# Patient Record
Sex: Male | Born: 1950 | Race: White | Hispanic: No | Marital: Married | Smoking: Former smoker
Health system: Southern US, Community
[De-identification: ages and names within clinical notes are randomized; demographics above are authoritative.]

## PROBLEM LIST (undated history)

## (undated) DIAGNOSIS — H409 Unspecified glaucoma: Secondary | ICD-10-CM

## (undated) DIAGNOSIS — G4730 Sleep apnea, unspecified: Secondary | ICD-10-CM

## (undated) DIAGNOSIS — F32A Depression, unspecified: Secondary | ICD-10-CM

## (undated) DIAGNOSIS — R12 Heartburn: Secondary | ICD-10-CM

## (undated) DIAGNOSIS — N200 Calculus of kidney: Secondary | ICD-10-CM

## (undated) DIAGNOSIS — Z87442 Personal history of urinary calculi: Secondary | ICD-10-CM

## (undated) DIAGNOSIS — M1250 Traumatic arthropathy, unspecified site: Secondary | ICD-10-CM

## (undated) DIAGNOSIS — I10 Essential (primary) hypertension: Secondary | ICD-10-CM

## (undated) DIAGNOSIS — C801 Malignant (primary) neoplasm, unspecified: Secondary | ICD-10-CM

## (undated) DIAGNOSIS — F419 Anxiety disorder, unspecified: Secondary | ICD-10-CM

## (undated) DIAGNOSIS — G894 Chronic pain syndrome: Secondary | ICD-10-CM

## (undated) DIAGNOSIS — M5030 Other cervical disc degeneration, unspecified cervical region: Secondary | ICD-10-CM

## (undated) DIAGNOSIS — F329 Major depressive disorder, single episode, unspecified: Secondary | ICD-10-CM

## (undated) DIAGNOSIS — G44309 Post-traumatic headache, unspecified, not intractable: Secondary | ICD-10-CM

## (undated) DIAGNOSIS — G43909 Migraine, unspecified, not intractable, without status migrainosus: Secondary | ICD-10-CM

## (undated) HISTORY — DX: Depression, unspecified: F32.A

## (undated) HISTORY — DX: Migraine, unspecified, not intractable, without status migrainosus: G43.909

## (undated) HISTORY — DX: Major depressive disorder, single episode, unspecified: F32.9

## (undated) HISTORY — DX: Chronic pain syndrome: G89.4

## (undated) HISTORY — DX: Sleep apnea, unspecified: G47.30

## (undated) HISTORY — PX: SKIN CANCER EXCISION: SHX779

## (undated) HISTORY — DX: Post-traumatic headache, unspecified, not intractable: G44.309

## (undated) HISTORY — DX: Traumatic arthropathy, unspecified site: M12.50

## (undated) HISTORY — DX: Malignant (primary) neoplasm, unspecified: C80.1

## (undated) HISTORY — DX: Essential (primary) hypertension: I10

## (undated) HISTORY — DX: Heartburn: R12

## (undated) HISTORY — DX: Unspecified glaucoma: H40.9

## (undated) HISTORY — PX: WISDOM TOOTH EXTRACTION: SHX21

## (undated) HISTORY — PX: TONSILLECTOMY: SUR1361

## (undated) HISTORY — DX: Anxiety disorder, unspecified: F41.9

## (undated) HISTORY — DX: Other cervical disc degeneration, unspecified cervical region: M50.30

## (undated) HISTORY — DX: Calculus of kidney: N20.0

---

## 1992-09-01 HISTORY — PX: COSMETIC SURGERY: SHX468

## 1993-09-01 HISTORY — PX: OTHER SURGICAL HISTORY: SHX169

## 2009-05-23 DIAGNOSIS — F32A Depression, unspecified: Secondary | ICD-10-CM | POA: Insufficient documentation

## 2009-05-23 DIAGNOSIS — M545 Low back pain, unspecified: Secondary | ICD-10-CM | POA: Insufficient documentation

## 2009-05-23 DIAGNOSIS — I10 Essential (primary) hypertension: Secondary | ICD-10-CM | POA: Insufficient documentation

## 2011-05-13 DIAGNOSIS — M1991 Primary osteoarthritis, unspecified site: Secondary | ICD-10-CM | POA: Insufficient documentation

## 2011-06-17 DIAGNOSIS — L570 Actinic keratosis: Secondary | ICD-10-CM | POA: Insufficient documentation

## 2012-02-10 DIAGNOSIS — H5210 Myopia, unspecified eye: Secondary | ICD-10-CM | POA: Insufficient documentation

## 2012-02-10 DIAGNOSIS — H401390 Pigmentary glaucoma, unspecified eye, stage unspecified: Secondary | ICD-10-CM | POA: Insufficient documentation

## 2012-06-28 DIAGNOSIS — J301 Allergic rhinitis due to pollen: Secondary | ICD-10-CM | POA: Insufficient documentation

## 2012-06-28 DIAGNOSIS — J320 Chronic maxillary sinusitis: Secondary | ICD-10-CM | POA: Insufficient documentation

## 2012-08-16 DIAGNOSIS — Z0289 Encounter for other administrative examinations: Secondary | ICD-10-CM | POA: Insufficient documentation

## 2013-01-26 DIAGNOSIS — J329 Chronic sinusitis, unspecified: Secondary | ICD-10-CM | POA: Insufficient documentation

## 2013-02-02 DIAGNOSIS — M1710 Unilateral primary osteoarthritis, unspecified knee: Secondary | ICD-10-CM | POA: Insufficient documentation

## 2013-02-02 DIAGNOSIS — M1990 Unspecified osteoarthritis, unspecified site: Secondary | ICD-10-CM | POA: Insufficient documentation

## 2013-04-27 DIAGNOSIS — M792 Neuralgia and neuritis, unspecified: Secondary | ICD-10-CM | POA: Insufficient documentation

## 2013-10-12 DIAGNOSIS — F419 Anxiety disorder, unspecified: Secondary | ICD-10-CM | POA: Insufficient documentation

## 2013-12-08 DIAGNOSIS — Z87898 Personal history of other specified conditions: Secondary | ICD-10-CM | POA: Insufficient documentation

## 2013-12-08 DIAGNOSIS — Z86006 Personal history of melanoma in-situ: Secondary | ICD-10-CM | POA: Insufficient documentation

## 2014-03-02 ENCOUNTER — Other Ambulatory Visit: Payer: Self-pay | Admitting: Urgent Care

## 2014-03-02 LAB — CBC WITH DIFFERENTIAL/PLATELET
Basophil #: 0 10*3/uL (ref 0.0–0.1)
Basophil %: 0.5 %
EOS PCT: 0.9 %
Eosinophil #: 0.1 10*3/uL (ref 0.0–0.7)
HCT: 34.6 % — ABNORMAL LOW (ref 40.0–52.0)
HGB: 10.4 g/dL — ABNORMAL LOW (ref 13.0–18.0)
LYMPHS ABS: 2.3 10*3/uL (ref 1.0–3.6)
Lymphocyte %: 28.2 %
MCH: 20 pg — ABNORMAL LOW (ref 26.0–34.0)
MCHC: 29.9 g/dL — ABNORMAL LOW (ref 32.0–36.0)
MCV: 67 fL — ABNORMAL LOW (ref 80–100)
Monocyte #: 1 x10 3/mm (ref 0.2–1.0)
Monocyte %: 12.8 %
NEUTROS PCT: 57.6 %
Neutrophil #: 4.7 10*3/uL (ref 1.4–6.5)
Platelet: 284 10*3/uL (ref 150–440)
RBC: 5.18 10*6/uL (ref 4.40–5.90)
RDW: 20.9 % — ABNORMAL HIGH (ref 11.5–14.5)
WBC: 8.1 10*3/uL (ref 3.8–10.6)

## 2014-03-02 LAB — IRON AND TIBC
Iron Bind.Cap.(Total): 457 ug/dL — ABNORMAL HIGH (ref 250–450)
Iron Saturation: 5 %
Iron: 23 ug/dL — ABNORMAL LOW (ref 65–175)
Unbound Iron-Bind.Cap.: 434 ug/dL

## 2014-03-02 LAB — FERRITIN: Ferritin (ARMC): 5 ng/mL — ABNORMAL LOW (ref 8–388)

## 2014-03-10 ENCOUNTER — Ambulatory Visit: Payer: Self-pay | Admitting: Gastroenterology

## 2014-04-04 ENCOUNTER — Ambulatory Visit: Payer: Self-pay | Admitting: Pain Medicine

## 2014-04-05 ENCOUNTER — Ambulatory Visit: Payer: Self-pay | Admitting: Pain Medicine

## 2014-04-13 ENCOUNTER — Other Ambulatory Visit: Payer: Self-pay | Admitting: Gastroenterology

## 2014-04-13 LAB — CBC WITH DIFFERENTIAL/PLATELET
BASOS ABS: 0.1 10*3/uL (ref 0.0–0.1)
BASOS PCT: 0.7 %
EOS ABS: 0.2 10*3/uL (ref 0.0–0.7)
Eosinophil %: 2.3 %
HCT: 41.1 % (ref 40.0–52.0)
HGB: 12.4 g/dL — ABNORMAL LOW (ref 13.0–18.0)
LYMPHS PCT: 24.8 %
Lymphocyte #: 2 10*3/uL (ref 1.0–3.6)
MCH: 22.8 pg — ABNORMAL LOW (ref 26.0–34.0)
MCHC: 30.2 g/dL — ABNORMAL LOW (ref 32.0–36.0)
MCV: 76 fL — AB (ref 80–100)
Monocyte #: 1 x10 3/mm (ref 0.2–1.0)
Monocyte %: 12 %
Neutrophil #: 4.9 10*3/uL (ref 1.4–6.5)
Neutrophil %: 60.2 %
Platelet: 214 10*3/uL (ref 150–440)
RBC: 5.44 10*6/uL (ref 4.40–5.90)
RDW: 27.3 % — AB (ref 11.5–14.5)
WBC: 8.1 10*3/uL (ref 3.8–10.6)

## 2014-04-20 ENCOUNTER — Ambulatory Visit: Payer: Self-pay | Admitting: Pain Medicine

## 2014-04-28 DIAGNOSIS — G44329 Chronic post-traumatic headache, not intractable: Secondary | ICD-10-CM | POA: Insufficient documentation

## 2014-05-02 ENCOUNTER — Ambulatory Visit: Payer: Self-pay | Admitting: Pain Medicine

## 2014-05-15 ENCOUNTER — Ambulatory Visit: Payer: Self-pay | Admitting: Pain Medicine

## 2014-06-13 ENCOUNTER — Ambulatory Visit: Payer: Self-pay | Admitting: Pain Medicine

## 2014-06-19 ENCOUNTER — Ambulatory Visit: Payer: Self-pay | Admitting: Pain Medicine

## 2014-07-13 ENCOUNTER — Ambulatory Visit: Payer: Self-pay | Admitting: Pain Medicine

## 2014-07-21 ENCOUNTER — Ambulatory Visit: Payer: Self-pay | Admitting: Urgent Care

## 2014-07-21 LAB — CBC WITH DIFFERENTIAL/PLATELET
Basophil #: 0.1 10*3/uL (ref 0.0–0.1)
Basophil %: 0.5 %
Eosinophil #: 0.2 10*3/uL (ref 0.0–0.7)
Eosinophil %: 1.8 %
HCT: 44.5 % (ref 40.0–52.0)
HGB: 14.7 g/dL (ref 13.0–18.0)
Lymphocyte #: 2.4 10*3/uL (ref 1.0–3.6)
Lymphocyte %: 21.4 %
MCH: 29.8 pg (ref 26.0–34.0)
MCHC: 33.1 g/dL (ref 32.0–36.0)
MCV: 90 fL (ref 80–100)
MONO ABS: 1.2 x10 3/mm — AB (ref 0.2–1.0)
MONOS PCT: 10.3 %
NEUTROS PCT: 66 %
Neutrophil #: 7.4 10*3/uL — ABNORMAL HIGH (ref 1.4–6.5)
Platelet: 199 10*3/uL (ref 150–440)
RBC: 4.95 10*6/uL (ref 4.40–5.90)
RDW: 14.7 % — ABNORMAL HIGH (ref 11.5–14.5)
WBC: 11.2 10*3/uL — ABNORMAL HIGH (ref 3.8–10.6)

## 2014-08-08 ENCOUNTER — Ambulatory Visit: Payer: Self-pay | Admitting: Pain Medicine

## 2014-08-30 DIAGNOSIS — F325 Major depressive disorder, single episode, in full remission: Secondary | ICD-10-CM | POA: Insufficient documentation

## 2014-09-12 ENCOUNTER — Ambulatory Visit: Payer: Self-pay | Admitting: Pain Medicine

## 2014-09-28 ENCOUNTER — Ambulatory Visit: Payer: Self-pay | Admitting: Pain Medicine

## 2014-10-26 ENCOUNTER — Ambulatory Visit: Payer: Self-pay | Admitting: Pain Medicine

## 2014-11-22 ENCOUNTER — Ambulatory Visit: Payer: Self-pay | Admitting: Pain Medicine

## 2014-12-25 ENCOUNTER — Ambulatory Visit
Admit: 2014-12-25 | Disposition: A | Discharge status: Home / Self Care | Payer: Self-pay | Attending: Pain Medicine | Admitting: Pain Medicine

## 2015-01-24 ENCOUNTER — Ambulatory Visit: Payer: Medicare Other | Attending: Pain Medicine | Admitting: Pain Medicine

## 2015-01-24 ENCOUNTER — Encounter: Payer: Self-pay | Admitting: Pain Medicine

## 2015-01-24 ENCOUNTER — Other Ambulatory Visit: Payer: Self-pay | Admitting: Pain Medicine

## 2015-01-24 VITALS — BP 121/66 | HR 52 | Temp 98.0°F | Resp 18 | Ht 69.0 in | Wt 188.0 lb

## 2015-01-24 DIAGNOSIS — M47816 Spondylosis without myelopathy or radiculopathy, lumbar region: Secondary | ICD-10-CM | POA: Insufficient documentation

## 2015-01-24 DIAGNOSIS — M5416 Radiculopathy, lumbar region: Secondary | ICD-10-CM

## 2015-01-24 DIAGNOSIS — M179 Osteoarthritis of knee, unspecified: Secondary | ICD-10-CM | POA: Insufficient documentation

## 2015-01-24 DIAGNOSIS — M533 Sacrococcygeal disorders, not elsewhere classified: Secondary | ICD-10-CM

## 2015-01-24 DIAGNOSIS — M5136 Other intervertebral disc degeneration, lumbar region: Secondary | ICD-10-CM

## 2015-01-24 DIAGNOSIS — M79604 Pain in right leg: Secondary | ICD-10-CM | POA: Diagnosis present

## 2015-01-24 DIAGNOSIS — M1710 Unilateral primary osteoarthritis, unspecified knee: Secondary | ICD-10-CM | POA: Insufficient documentation

## 2015-01-24 DIAGNOSIS — M51369 Other intervertebral disc degeneration, lumbar region without mention of lumbar back pain or lower extremity pain: Secondary | ICD-10-CM | POA: Insufficient documentation

## 2015-01-24 DIAGNOSIS — M461 Sacroiliitis, not elsewhere classified: Secondary | ICD-10-CM | POA: Insufficient documentation

## 2015-01-24 DIAGNOSIS — M1712 Unilateral primary osteoarthritis, left knee: Secondary | ICD-10-CM

## 2015-01-24 DIAGNOSIS — M79605 Pain in left leg: Secondary | ICD-10-CM | POA: Diagnosis present

## 2015-01-24 DIAGNOSIS — M545 Low back pain: Secondary | ICD-10-CM | POA: Diagnosis present

## 2015-01-24 MED ORDER — GABAPENTIN 100 MG PO CAPS: ORAL_CAPSULE | ORAL | Status: DC

## 2015-01-24 MED ORDER — HYDROCODONE-ACETAMINOPHEN 7.5-325 MG PO TABS: ORAL_TABLET | ORAL | Status: DC

## 2015-01-24 MED ORDER — FENTANYL 50 MCG/HR TD PT72: 50.0000 ug | MEDICATED_PATCH | TRANSDERMAL | Status: DC

## 2015-01-24 NOTE — Progress Notes (Signed)
Ambulatory with scripts of fentanyl hydrocodone Gabapentin  At 805am

## 2015-01-24 NOTE — Patient Instructions (Addendum)
Continue present medications.  Geniculate nerve blocks of left knee on 02/05/2015  F/U PCP for evaliation of  BP and general medical  condition.  F/U surgical evaluation.  F/U neurological evaluation.  May consider radiofrequency rhizolysis or intraspinal procedures pending response to present treatment and F/U evaluation.  Knee Injection Joint injections are shots. Your caregiver will place a needle into your knee joint. The needle is used to put medicine into the joint. These shots can be used to help treat different painful knee conditions such as osteoarthritis, bursitis, local flare-ups of rheumatoid arthritis, and pseudogout. Anti-inflammatory medicines such as corticosteroids and anesthetics are the most common medicines used for joint and soft tissue injections.  PROCEDURE  The skin over the kneecap will be cleaned with an antiseptic solution.  Your caregiver will inject a small amount of a local anesthetic (a medicine like Novocaine) just under the skin in the area that was cleaned.  After the area becomes numb, a second injection is done. This second injection usually includes an anesthetic and an anti-inflammatory medicine called a steroid or cortisone. The needle is carefully placed in between the kneecap and the knee, and the medicine is injected into the joint space.  After the injection is done, the needle is removed. Your caregiver may place a bandage over the injection site. The whole procedure takes no more than a couple of minutes. BEFORE THE PROCEDURE  Wash all of the skin around the entire knee area. Try to remove any loose, scaling skin. There is no other specific preparation necessary unless advised otherwise by your caregiver. LET YOUR CAREGIVER KNOW ABOUT:   Allergies.  Medications taken including herbs, eye drops, over the counter medications, and creams.  Use of steroids (by mouth or creams).  Possible pregnancy, if applicable.  Previous problems with  anesthetics or Novocaine.  History of blood clots (thrombophlebitis).  History of bleeding or blood problems.  Previous surgery.  Other health problems. RISKS AND COMPLICATIONS Side effects from cortisone shots are rare. They include:   Slight bruising of the skin.  Shrinkage of the normal fatty tissue under the skin where the shot was given.  Increase in pain after the shot.  Infection.  Weakening of tendons or tendon rupture.  Allergic reaction to the medicine.  Diabetics may have a temporary increase in their blood sugar after a shot.  Cortisone can temporarily weaken the immune system. While receiving these shots, you should not get certain vaccines. Also, avoid contact with anyone who has chickenpox or measles. Especially if you have never had these diseases or have not been previously immunized. Your immune system may not be strong enough to fight off the infection while the cortisone is in your system. AFTER THE PROCEDURE   You can go home after the procedure.  You may need to put ice on the joint 15-20 minutes every 3 or 4 hours until the pain goes away.  You may need to put an elastic bandage on the joint. HOME CARE INSTRUCTIONS   Only take over-the-counter or prescription medicines for pain, discomfort, or fever as directed by your caregiver.  You should avoid stressing the joint. Unless advised otherwise, avoid activities that put a lot of pressure on a knee joint, such as:  Jogging.  Bicycling.  Recreational climbing.  Hiking.  Laying down and elevating the leg/knee above the level of your heart can help to minimize swelling. SEEK MEDICAL CARE IF:   You have repeated or worsening swelling.  There is drainage  from the puncture area.  You develop red streaking that extends above or below the site where the needle was inserted. SEEK IMMEDIATE MEDICAL CARE IF:   You develop a fever.  You have pain that gets worse even though you are taking pain  medicine.  The area is red and warm, and you have trouble moving the joint. MAKE SURE YOU:   Understand these instructions.  Will watch your condition.  Will get help right away if you are not doing well or get worse. Document Released: 11/09/2006 Document Revised: 11/10/2011 Document Reviewed: 08/06/2007 [facility] Patient Information 2015 [place], [place]. This information is not intended to replace advice given to you by your health care provider. Make sure you discuss any questions you have with your health care provider. Marland Kitchen

## 2015-01-24 NOTE — Progress Notes (Signed)
   Subjective:    Patient ID: Tanner Baker, male    DOB: [birth year], 64 y.o.   MRN: [id number]  HPI   Patientt is 64 year old gentleman [facility] for further evaluation and treatment of pain involving the lumbar and lower extremity region. Patient states that his pain involves the region of the left knee at this time. Patient is known degenerative changes of the left knee. Patient states that the pain is aggravated by standing walking and that the pain is quite severe at midday and continues to become more intense as the day progresses and interferes with patient's ability to obtain restful sleep. We will continue patient's present medications and will consider patient for interventional treatment  Geniculate nerve blocks of the knee at time return appointment is discussed and explained to patient on today's visit     Review of Systems     Objective:   Physical Exam  palpation of the splenius capitis and occipitalis muscles reproduced mild discomfort there was no tenderness of the acromioclavicular glenohumeral joint regions. Patient was with bilaterally equal grip strength. Tinel's and Phalen's maneuver were without increased pain of significant degree. There was mild tenderness over the thoracic paraspinal musculature region in the lower thoracic region without crepitus of the thoracic region noted. Palpation over the lumbar paraspinal muscles lumbar facet region reproduces moderate discomfort. Straight leg raising was tolerates approximately 30 without increase of pain with dorsiflexion noted. No definite sensory deficit of dermatomal distribution was detected. Palpation of the knee was in crepitus of the knee noted with range of motion of the knee being performed. There was negative anterior and posterior drawer signs without ballottement of the patella. No increased warmth erythema of the knee was noted. There was severe pain with range of motion maneuvers of the knee.  Mild tenderness of the greater trochanteric region noted. Abdomen nontender and no costovertebral angle tenderness was noted.           Assessment & Plan:     Degenerative joint disease of knee   Degenerative disc disease lumbar spine   Lumbar facet syndrome   Sacroiliitis    Plan     Continue present medications.  Geniculate nerve blocks of the left knee to be performed at time of return appointment  F/U PCP for evaliation of  BP and general medical  condition.  F/U surgical evaluation.  F/U neurological evaluation.  May consider radiofrequency rhizolysis or intraspinal procedures pending response to present treatment and F/U evaluation.  Patient to call [facility] should patient have concerns prior to scheduled return appointment.

## 2015-01-24 NOTE — Progress Notes (Signed)
   Subjective:    Patient ID: Tanner Baker, male    DOB: May 22, 1951, 64 y.o.   MRN: [id number]  HPI    Review of Systems     Objective:   Physical Exam        Assessment & Plan:

## 2015-02-04 ENCOUNTER — Other Ambulatory Visit: Payer: Self-pay | Admitting: Pain Medicine

## 2015-02-07 ENCOUNTER — Encounter: Payer: Self-pay | Admitting: Pain Medicine

## 2015-02-07 ENCOUNTER — Ambulatory Visit: Payer: Medicare Other | Attending: Pain Medicine | Admitting: Pain Medicine

## 2015-02-07 VITALS — BP 121/59 | HR 57 | Temp 98.4°F | Resp 14 | Ht 69.0 in | Wt 188.0 lb

## 2015-02-07 DIAGNOSIS — M533 Sacrococcygeal disorders, not elsewhere classified: Secondary | ICD-10-CM

## 2015-02-07 DIAGNOSIS — M170 Bilateral primary osteoarthritis of knee: Secondary | ICD-10-CM

## 2015-02-07 DIAGNOSIS — M1712 Unilateral primary osteoarthritis, left knee: Secondary | ICD-10-CM | POA: Diagnosis not present

## 2015-02-07 DIAGNOSIS — M47816 Spondylosis without myelopathy or radiculopathy, lumbar region: Secondary | ICD-10-CM

## 2015-02-07 DIAGNOSIS — M5416 Radiculopathy, lumbar region: Secondary | ICD-10-CM

## 2015-02-07 DIAGNOSIS — R209 Unspecified disturbances of skin sensation: Secondary | ICD-10-CM | POA: Insufficient documentation

## 2015-02-07 DIAGNOSIS — M5136 Other intervertebral disc degeneration, lumbar region: Secondary | ICD-10-CM

## 2015-02-07 DIAGNOSIS — M545 Low back pain: Secondary | ICD-10-CM | POA: Diagnosis present

## 2015-02-07 DIAGNOSIS — M79604 Pain in right leg: Secondary | ICD-10-CM | POA: Diagnosis present

## 2015-02-07 DIAGNOSIS — M79605 Pain in left leg: Secondary | ICD-10-CM | POA: Diagnosis present

## 2015-02-07 MED ORDER — TRIAMCINOLONE ACETONIDE 40 MG/ML IJ SUSP
INTRAMUSCULAR | Status: AC
  Administered 2015-02-07: 40 mg
  Filled 2015-02-07: qty 1

## 2015-02-07 MED ORDER — MIDAZOLAM HCL 5 MG/5ML IJ SOLN
INTRAMUSCULAR | Status: AC
  Administered 2015-02-07: 3 mg via INTRAVENOUS
  Filled 2015-02-07: qty 5

## 2015-02-07 MED ORDER — BUPIVACAINE HCL (PF) 0.25 % IJ SOLN
INTRAMUSCULAR | Status: AC
  Administered 2015-02-07: 30 mL
  Filled 2015-02-07: qty 30

## 2015-02-07 MED ORDER — FENTANYL CITRATE (PF) 100 MCG/2ML IJ SOLN
INTRAMUSCULAR | Status: AC
  Administered 2015-02-07: 100 ug via INTRAVENOUS
  Filled 2015-02-07: qty 2

## 2015-02-07 NOTE — Patient Instructions (Addendum)
Continue present medications.  F/U PCP for evaliation of  BP and general medical  condition.  F/U surgical evaluation.  F/U neurological evaluation.  May consider radiofrequency rhizolysis or intraspinal procedures pending response to present treatment and F/U evaluation.  Patient to call [facility] should patient have concerns prior to scheduled return appointment. Pain Management Discharge Instructions  General Discharge Instructions :  If you need to reach your doctor call: Monday-Friday 8:00 am - 4:00 pm at [phone] or toll free [phone].  After clinic hours [phone] to have operator reach doctor.  Bring all of your medication bottles to all your appointments in the pain clinic.  To cancel or reschedule your appointment with Pain Management please remember to call 24 hours in advance to avoid a fee.  Refer to the educational materials which you have been given on: General Risks, I had my Procedure. Discharge Instructions, Post Sedation.  Post Procedure Instructions:  The drugs you were given will stay in your system until tomorrow, so for the next 24 hours you should not drive, make any legal decisions or drink any alcoholic beverages.  You may eat anything you prefer, but it is better to start with liquids then soups and crackers, and gradually work up to solid foods.  Please notify your doctor immediately if you have any unusual bleeding, trouble breathing or pain that is not related to your normal pain.  Depending on the type of procedure that was done, some parts of your body may feel week and/or numb.  This usually clears up by tonight or the next day.  Walk with the use of an assistive device or accompanied by an adult for the 24 hours.  You may use ice on the affected area for the first 24 hours.  Put ice in a Ziploc bag and cover with a towel and place against area 15 minutes on 15 minutes off.  You may switch to heat after 24 hours. 

## 2015-02-07 NOTE — Progress Notes (Signed)
Subjective:    Patient ID: Tanner Baker, male    DOB: 1951/03/26, 64 y.o.   MRN: [id number]  HPI     Geniculate nerve blocks of the left knee   The patient is a 64 y.o. male who returns [facility] the [facility] for further evaluation and treatment of pain involving the lumbar lower extremity region with severe pain of the left knee. Patient is with known degenerative changes of the left knee with left knee pain which has progressed over the past several months. There is concern regarding degenerative joint disease of the knee contributing to patient's pain and paresthesias of the knee.  We will proceed with geniculate nerve blocks of the left knee in an attempt to decrease severity of symptoms, minimize the risk of medication escalation, hopefully retard progression of symptoms and avoid the need for more involved treatment.  The risks benefits and expectations of the procedure were discussed with the patient. The patient was with understanding and in agreement with suggested treatment plan.  DESCRIPTION OF PROCEDURE: Geniculate nerve blocks of the left knee. The  procedure was performed with IV Versed and IV fentanyl, conscious sedation and under fluoroscopic guidance.  NEEDLE PLACEMENT FOR BLOCK OF THE LATERAL SUPERIOR GENICULATE NERVE: The patient was taking to the fluoroscopy suite. With the patient supine, with knee in flexed position, Betadine prep of proposed entry site accomplished.  IV Versed, IV fentanyl conscious sedation, EKG, blood pressure, pulse and pulse oximetry monitoring were all in place. Under fluoroscopic guidance, a 22-gauge needle was inserted in the region of the left knee with needle placed lateral border of the femur at the junction of the shaft of the femur and the condyle of the femur.  Following needle placement at the lateral aspect of the knee, needle placement was then accomplished in the region of the medial aspect of the knee.  NEEDLE PLACEMENT FOR BLOCK  OF THE MEDIAL SUPERIOR GENICULATE NERVE:  Under fluoroscopic guidance, a 22 - gauge needle was inserted in the region of the left knee with needle placed medial border of the femur at the junction of the shaft of the femur and the condyle of the femur.   NEEDLE PLACEMENT FOR BLOCK OF THE MEDIAL INFERIOR GENICULATE NERVE:  Under fluoroscopic guidance, a 22 - gauge needle was inserted in the region of the left knee with needle placed at the junction of the shaft and plateau of the tibia.   Following needle placement on AP view of needles placed in all three locations, placement was then verified on lateral view with the tips of the superior lateral and superior medial needles documented to be one half the distance of the shaft of the femur and the tip of the inferior medial geniculate needle documented to be one half the distance of the shaft of the tibia.  Following documentation of needle placements on lateral view, H needle was injected with one mL of 0.25% bupivacaine with Kenalog. A total of 10 mg of Kenalog was utilized for the entire procedure. The patient tolerated the procedure well.    PLAN 1. Medications: Continue present medications 2. Follow-up appointment with PCP for evaluation of blood pressure and general medical condition. 3. Follow-up surgical evaluation 4. Follow-up neurological evaluation 5. He patient may be a candidate for radiofrequency rhizolysis and other treatment pending response to treatment on today's visit and follow-up evaluation. 6. The patient is advised to adhere to proper body mechanics and avoid activities which appear to aggravate condition  7. Orthopedic evaluation of knee as discussed  Review of Systems    Objective:   Physical Exam        Assessment & Plan:

## 2015-02-07 NOTE — Progress Notes (Signed)
Safety precautions to be maintained throughout the outpatient stay will include: orient to surroundings, keep bed in low position, maintain call bell within reach at all times, provide assistance with transfer out of bed and ambulation.  Tolerating po fluids well. No c/o nausea. Discharged via w/c at 0901 am

## 2015-02-08 ENCOUNTER — Telehealth: Payer: Self-pay | Admitting: *Deleted

## 2015-02-08 NOTE — Telephone Encounter (Signed)
No problems with procedure.

## 2015-02-21 ENCOUNTER — Encounter: Payer: Self-pay | Admitting: Pain Medicine

## 2015-02-21 ENCOUNTER — Ambulatory Visit: Payer: Medicare Other | Attending: Pain Medicine | Admitting: Pain Medicine

## 2015-02-21 VITALS — BP 117/67 | HR 56 | Temp 98.3°F | Resp 18 | Ht 69.0 in | Wt 188.0 lb

## 2015-02-21 DIAGNOSIS — M5136 Other intervertebral disc degeneration, lumbar region: Secondary | ICD-10-CM | POA: Insufficient documentation

## 2015-02-21 DIAGNOSIS — M179 Osteoarthritis of knee, unspecified: Secondary | ICD-10-CM | POA: Diagnosis not present

## 2015-02-21 DIAGNOSIS — M5416 Radiculopathy, lumbar region: Secondary | ICD-10-CM

## 2015-02-21 DIAGNOSIS — M545 Low back pain: Secondary | ICD-10-CM | POA: Diagnosis present

## 2015-02-21 DIAGNOSIS — M533 Sacrococcygeal disorders, not elsewhere classified: Secondary | ICD-10-CM | POA: Insufficient documentation

## 2015-02-21 DIAGNOSIS — M79604 Pain in right leg: Secondary | ICD-10-CM | POA: Diagnosis present

## 2015-02-21 DIAGNOSIS — M79605 Pain in left leg: Secondary | ICD-10-CM | POA: Diagnosis present

## 2015-02-21 DIAGNOSIS — M170 Bilateral primary osteoarthritis of knee: Secondary | ICD-10-CM

## 2015-02-21 DIAGNOSIS — M47816 Spondylosis without myelopathy or radiculopathy, lumbar region: Secondary | ICD-10-CM

## 2015-02-21 MED ORDER — FENTANYL 50 MCG/HR TD PT72: 50.0000 ug | MEDICATED_PATCH | TRANSDERMAL | Status: DC

## 2015-02-21 MED ORDER — HYDROCODONE-ACETAMINOPHEN 7.5-325 MG PO TABS: ORAL_TABLET | ORAL | Status: DC

## 2015-02-21 NOTE — Progress Notes (Signed)
Safety precautions to be maintained throughout the outpatient stay will include: orient to surroundings, keep bed in low position, maintain call bell within reach at all times, provide assistance with transfer out of bed and ambulation.  Discharge via ambulatory at 7:52 am

## 2015-02-21 NOTE — Patient Instructions (Signed)
Continue present medications.  F/U PCP for evaliation of  BP and general medical  condition.  F/U surgical evaluation.  F/U neurological evaluation.  May consider radiofrequency rhizolysis or intraspinal procedures pending response to present treatment and F/U evaluation.  Patient to call [facility] should patient have concerns prior to scheduled return appointment.  

## 2015-02-21 NOTE — Progress Notes (Signed)
   Subjective:    Patient ID: Tanner Baker, male    DOB: 1951/04/13, 64 y.o.   MRN: [id number]  HPI  Patient is 64 year old gentleman who returns to [facility] for further evaluation and treatment of pain involving the lower back lower extremity region and region of the left knee. Patient is with prior interventional treatment performed in [facility] and present time patient has had significant relief of his pain involving the region of the left knee status post geniculate nerve blocks of the left knee. We discussed patient's overall condition and patient is with pain well-controlled with fentanyl patch with hydrocodone acetaminophen for breakthrough pain. We'll continue present medications and avoid interventional treatment at this time. The patient is understanding and agrees with suggested treatment plan.    Review of Systems     Objective:   Physical Exam There was tenderness of the splenius capitis occipitalis musculature region of mild degree. There appeared to be unremarkable Spurling's maneuver. Patient appeared to be with slightly increased grip strength and Tinel and Phalen's maneuver were without increase of pain of significant degree. Palpation of the thoracic facet thoracic paraspinal musculature region reproduced minimal discomfort. With no crepitus of the thoracic region noted. There was tenderness over the paraspinal muscles region the lumbar region lumbar facet region a mild degree. Palpation over the PSIS and PII S region reproduced mild discomfort. There was mild tenderness of the greater trochanteric region along the iliotibial band region. Straight leg raising was tolerates 30 without increased pain dorsiflexion noted. There was mild tends to palpation of the left knee with no increased warmth or erythema in the region of the left knee no ballottement of the patella of the left knee there was negative negative anterior and posterior drawer signs. There  was negative clonus negative Homans. Abdomen nontender and no costovertebral angle tenderness noted.       Assessment & Plan:  DDD lumbar  Lumbar facet syndrome  DJD knee Status post significant improvement of symptoms of the left knee following geniculate nerve blocks of the left knee  Sacroiliac joint dysfunction    Plan  Continue present medications Neurontin, hydrocodone acetaminophen, and fentanyl patch.  F/U PCP for evaliation of  BP and general medical  condition.  F/U surgical evaluation.  F/U neurological evaluation.  May consider radiofrequency rhizolysis or intraspinal procedures pending response to present treatment and F/U evaluation.  Patient to call [facility] should patient have concerns prior to scheduled return appointment.

## 2015-03-09 ENCOUNTER — Other Ambulatory Visit: Payer: Self-pay | Admitting: Pain Medicine

## 2015-03-20 ENCOUNTER — Encounter: Payer: Self-pay | Admitting: Pain Medicine

## 2015-03-20 ENCOUNTER — Ambulatory Visit: Payer: Medicare Other | Attending: Pain Medicine | Admitting: Pain Medicine

## 2015-03-20 VITALS — BP 128/66 | HR 51 | Temp 98.8°F | Resp 16 | Ht 69.0 in | Wt 188.0 lb

## 2015-03-20 DIAGNOSIS — M51369 Other intervertebral disc degeneration, lumbar region without mention of lumbar back pain or lower extremity pain: Secondary | ICD-10-CM

## 2015-03-20 DIAGNOSIS — M47816 Spondylosis without myelopathy or radiculopathy, lumbar region: Secondary | ICD-10-CM

## 2015-03-20 DIAGNOSIS — M170 Bilateral primary osteoarthritis of knee: Secondary | ICD-10-CM

## 2015-03-20 DIAGNOSIS — M5136 Other intervertebral disc degeneration, lumbar region: Secondary | ICD-10-CM | POA: Diagnosis not present

## 2015-03-20 DIAGNOSIS — M1710 Unilateral primary osteoarthritis, unspecified knee: Secondary | ICD-10-CM | POA: Diagnosis not present

## 2015-03-20 DIAGNOSIS — M5416 Radiculopathy, lumbar region: Secondary | ICD-10-CM

## 2015-03-20 DIAGNOSIS — M545 Low back pain: Secondary | ICD-10-CM | POA: Diagnosis present

## 2015-03-20 DIAGNOSIS — M5126 Other intervertebral disc displacement, lumbar region: Secondary | ICD-10-CM | POA: Diagnosis not present

## 2015-03-20 DIAGNOSIS — M533 Sacrococcygeal disorders, not elsewhere classified: Secondary | ICD-10-CM

## 2015-03-20 MED ORDER — HYDROCODONE-ACETAMINOPHEN 7.5-325 MG PO TABS: ORAL_TABLET | ORAL | Status: DC

## 2015-03-20 MED ORDER — FENTANYL 50 MCG/HR TD PT72: 50.0000 ug | MEDICATED_PATCH | TRANSDERMAL | Status: DC

## 2015-03-20 NOTE — Patient Instructions (Signed)
Continue present medications. Hydrocodone acetaminophen and fentanyl patch  F/U PCP Dr.Lada   for evaliation of  BP and general medical  condition.  F/U surgical evaluation  F/U neurological evaluation  May consider radiofrequency rhizolysis or intraspinal procedures pending response to present treatment and F/U evaluation.  Patient to call [facility] should patient have concerns prior to scheduled return appointment.

## 2015-03-20 NOTE — Progress Notes (Signed)
   Subjective:    Patient ID: Tanner Baker, male    DOB: [birth year], 64 y.o.   MRN: [id number]  HPI  Patient is 64 year old gentleman returns to [facility] for further evaluation and treatment of pain involving involving the lower back lower extremity region and region of the knee. Patient with pain fairly well-controlled at this time. Patient is able to perform activities of daily living as well as obtain restful sleep without significant pain interferes with activities of daily living. Patient denies trauma change in events of daily living the call significant change in symptomatology. We will continue medications and avoid interventional treatment as discussed and explained to patient on today's visit was understanding and agreement with suggested treatment plan.     Review of Systems     Objective:   Physical Exam  There was tends to palpation of the splenius capitis and occipitalis musculature region of minimal degree. Palpation over the region of the cervical facet cervical paraspinal musculature region reproduced minimal discomfort. Palpation over the acromioclavicular and glenohumeral joint region was without increased pain of any significant degree. Patient was with bilaterally equal grip strength. Tinel and Phalen's maneuver were without increase of pain of any significant degree. There was minimal tenderness over the thoracic facet thoracic paraspinal musculature region. Palpation over the lumbar paraspinal musculature region lumbar facet region was not was tends to palpation of mild degree extension and palpation of the lumbar facets reproduce mild discomfort. Lateral bending and rotation was associated with mild discomfort in the lumbar region with minimal tenderness over the PSIS and PII S region. There was mild tenderness along the greater trochanteric region iliotibial band region. Straight leg raising tolerated to 30 without increased pain with dorsiflexion noted.  Negative clonus negative Homans. Knees were without excessive tenderness to palpation. There was no increased warmth or erythema in the region of the knees. There was negative anterior and posterior drawer signs. Abdomen nontender and no costovertebral angle tenderness noted.       Assessment & Plan:    Degenerative disc disease lumbar spine  L1-L2 bulging disks, multilevel degenerative changes throughout the lumbar spine  Lumbar facet syndrome  Sacroiliac joint dysfunction  Degenerative joint disease of knee    Plan   Continue present medications hydrocodone acetaminophen and fentanyl patch  F/U PCP Dr. Ouida Sills  for evaliation of  BP and general medical  condition.  F/U surgical evaluation  F/U neurological evaluation  May consider radiofrequency rhizolysis or intraspinal procedures pending response to present treatment and F/U evaluation.  Patient to call [facility] should patient have concerns prior to scheduled return appointment.

## 2015-03-20 NOTE — Progress Notes (Signed)
Safety precautions to be maintained throughout the outpatient stay will include: orient to surroundings, keep bed in low position, maintain call bell within reach at all times, provide assistance with transfer out of bed and ambulation.  

## 2015-03-21 ENCOUNTER — Encounter: Payer: Medicare Other | Admitting: Pain Medicine

## 2015-03-30 ENCOUNTER — Encounter: Payer: Self-pay | Admitting: Emergency Medicine

## 2015-03-30 ENCOUNTER — Emergency Department
Admission: EM | Admit: 2015-03-30 | Discharge: 2015-03-30 | Disposition: A | Discharge status: Home / Self Care | Payer: Medicare Other | Attending: Emergency Medicine | Admitting: Emergency Medicine

## 2015-03-30 DIAGNOSIS — Z0000 Encounter for general adult medical examination without abnormal findings: Secondary | ICD-10-CM | POA: Insufficient documentation

## 2015-03-30 DIAGNOSIS — Z79899 Other long term (current) drug therapy: Secondary | ICD-10-CM | POA: Insufficient documentation

## 2015-03-30 DIAGNOSIS — Z85828 Personal history of other malignant neoplasm of skin: Secondary | ICD-10-CM | POA: Diagnosis not present

## 2015-03-30 DIAGNOSIS — I10 Essential (primary) hypertension: Secondary | ICD-10-CM | POA: Insufficient documentation

## 2015-03-30 DIAGNOSIS — F919 Conduct disorder, unspecified: Secondary | ICD-10-CM | POA: Diagnosis present

## 2015-03-30 DIAGNOSIS — F4325 Adjustment disorder with mixed disturbance of emotions and conduct: Secondary | ICD-10-CM | POA: Diagnosis not present

## 2015-03-30 LAB — COMPREHENSIVE METABOLIC PANEL
ALBUMIN: 4.3 g/dL (ref 3.5–5.0)
ALT: 20 U/L (ref 17–63)
ANION GAP: 12 (ref 5–15)
AST: 28 U/L (ref 15–41)
Alkaline Phosphatase: 46 U/L (ref 38–126)
BUN: 17 mg/dL (ref 6–20)
CALCIUM: 8.9 mg/dL (ref 8.9–10.3)
CHLORIDE: 95 mmol/L — AB (ref 101–111)
CO2: 30 mmol/L (ref 22–32)
CREATININE: 0.96 mg/dL (ref 0.61–1.24)
GFR calc Af Amer: >60 mL/min (ref >60)
Glucose, Bld: 116 mg/dL — ABNORMAL HIGH (ref 65–99)
Potassium: 2.7 mmol/L — CL (ref 3.5–5.1)
Sodium: 137 mmol/L (ref 135–145)
Total Bilirubin: 1.1 mg/dL (ref 0.3–1.2)
Total Protein: 7.6 g/dL (ref 6.5–8.1)

## 2015-03-30 LAB — SALICYLATE LEVEL

## 2015-03-30 LAB — ETHANOL

## 2015-03-30 LAB — ACETAMINOPHEN LEVEL

## 2015-03-30 MED ORDER — POTASSIUM CHLORIDE CRYS ER 20 MEQ PO TBCR
40.0000 meq | EXTENDED_RELEASE_TABLET | Freq: Once | ORAL | Status: AC
  Administered 2015-03-30: 40 meq via ORAL
  Filled 2015-03-30: qty 2

## 2015-03-30 NOTE — ED Notes (Addendum)
Pt here by [facility] with IVC papers. Pt handcuffed upon arrival, pt calm at this time.  Officer reports pt is having "severe suicidal thoughts" at this time.
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
## 2015-03-30 NOTE — ED Notes (Signed)
Lanny Hurst, EMTP, at bedside 1:1 sitter with patient at this time.

## 2015-03-30 NOTE — ED Notes (Signed)
IVC pt came with papers

## 2015-03-30 NOTE — ED Notes (Signed)

## 2015-03-30 NOTE — Discharge Instructions (Signed)
Please be careful what you tell to people. Return for any problems you may encounter we are always opened.
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
## 2015-03-30 NOTE — ED Notes (Signed)
BEHAVIORAL HEALTH ROUNDING Patient sleeping: No. Patient alert and oriented: yes Behavior appropriate: Yes.  ;  Nutrition and fluids offered: Yes  Toileting and hygiene offered: Yes  Sitter present: yes Law enforcement present: Yes  

## 2015-03-30 NOTE — ED Notes (Addendum)
BEHAVIORAL HEALTH ROUNDING Patient sleeping: No. Patient alert and oriented: yes Behavior appropriate: Yes.  ; If no, describe:  Nutrition and fluids offered: Yes  Toileting and hygiene offered: Yes  Sitter present: yes Law enforcement present: Yes  
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
## 2015-03-30 NOTE — ED Notes (Signed)
BEHAVIORAL HEALTH ROUNDING Patient sleeping: No. Patient alert and oriented: yes Behavior appropriate: Yes.  ; If no, describe:  Nutrition and fluids offered: Yes  Toileting and hygiene offered: Yes  Sitter present: yes Law enforcement present: Yes  

## 2015-04-19 ENCOUNTER — Ambulatory Visit: Payer: Medicare Other | Attending: Pain Medicine | Admitting: Pain Medicine

## 2015-04-19 VITALS — BP 119/59 | HR 55 | Temp 98.1°F | Resp 16 | Ht 69.0 in | Wt 188.0 lb

## 2015-04-19 DIAGNOSIS — M179 Osteoarthritis of knee, unspecified: Secondary | ICD-10-CM | POA: Insufficient documentation

## 2015-04-19 DIAGNOSIS — M5126 Other intervertebral disc displacement, lumbar region: Secondary | ICD-10-CM | POA: Insufficient documentation

## 2015-04-19 DIAGNOSIS — F419 Anxiety disorder, unspecified: Secondary | ICD-10-CM | POA: Diagnosis not present

## 2015-04-19 DIAGNOSIS — M5136 Other intervertebral disc degeneration, lumbar region: Secondary | ICD-10-CM | POA: Diagnosis not present

## 2015-04-19 DIAGNOSIS — M545 Low back pain: Secondary | ICD-10-CM | POA: Diagnosis present

## 2015-04-19 DIAGNOSIS — M79605 Pain in left leg: Secondary | ICD-10-CM | POA: Diagnosis present

## 2015-04-19 DIAGNOSIS — M533 Sacrococcygeal disorders, not elsewhere classified: Secondary | ICD-10-CM | POA: Insufficient documentation

## 2015-04-19 DIAGNOSIS — M170 Bilateral primary osteoarthritis of knee: Secondary | ICD-10-CM

## 2015-04-19 DIAGNOSIS — M5416 Radiculopathy, lumbar region: Secondary | ICD-10-CM

## 2015-04-19 DIAGNOSIS — M79604 Pain in right leg: Secondary | ICD-10-CM | POA: Diagnosis present

## 2015-04-19 DIAGNOSIS — M47816 Spondylosis without myelopathy or radiculopathy, lumbar region: Secondary | ICD-10-CM | POA: Insufficient documentation

## 2015-04-19 MED ORDER — HYDROCODONE-ACETAMINOPHEN 7.5-325 MG PO TABS: ORAL_TABLET | ORAL | Status: DC

## 2015-04-19 MED ORDER — FENTANYL 50 MCG/HR TD PT72: MEDICATED_PATCH | TRANSDERMAL | Status: DC

## 2015-04-19 NOTE — Progress Notes (Signed)
Safety precautions to be maintained throughout the outpatient stay will include: orient to surroundings, keep bed in low position, maintain call bell within reach at all times, provide assistance with transfer out of bed and ambulation.  

## 2015-04-19 NOTE — Patient Instructions (Signed)
Continue present medications Neurontin fentanyl patch and hydrocodone acetaminophen   F/U PCP Dr. Ouida Sills for evaliation of  BP and general medical  condition  F/U surgical evaluation  F/U neurological evaluation  May consider radiofrequency rhizolysis or intraspinal procedures pending response to present treatment and F/U evaluation   [facility] should patient have concerns prior to scheduled return appointmen.

## 2015-04-19 NOTE — Progress Notes (Signed)
   Subjective:    Patient ID: Tanner Baker, male    DOB: 01-20-1951, 64 y.o.   MRN: [id number]  HPI  Patient is 64 year old gentleman [facility] for further evaluation and treatment of pain involving the lower back lower extremity region and knees. At the present time patient's pain appears to be fairly well-controlled at the present medications. We discussed patient's overall condition. Patient states that he has a component of anxiety and would like to have his lorazepam refilled. We informed patient that would prefer to have patient discussed this with his primary care physician and consider psych evaluation as well to consider other medications which may be able to treat patient's condition more effectively. We will continue patient's present medications Neurontin hydrocodone acetaminophen and fentanyl patch and will remain available to proceed with interventional treatment as well as additional modification of treatment regimen as needed as discussed and explained to patient on today's visit. The patient was with understanding and in agreement with suggested treatment plan.     Review of Systems     Objective:   Physical Exam  There was tenderness of the splenius capitis and occipitalis region of mild degree. There was mild tenderness of the cervical facet cervical paraspinal musculature region and the thoracic facet thoracic paraspinal musculature region. There was unremarkable Spurling's maneuver. Mild tinnitus of the acromioclavicular glenohumeral joint region was noted. Mild tennis over the thoracic facet thoracic paraspinal musculature region was noted with no crepitus of the thoracic region noted. There was tenderness over the lumbar paraspinal muscles region lumbar facet region a mild degree. Lateral bending and rotation and extension and palpation of the lumbar facets reproduce mild discomfort. There was minimal tenderness of the knees noted with crepitus of the  knees noted with negative anterior and posterior drawer signs and with no ballottement of the patella noted. No increased warmth or erythema of the knees were noted. Straight leg raising was tolerates approximately 30. There was negative clonus negative Homans. DTRs appear to be trace at the knees. No sensory deficit of dermatomal distribution detected. There was stated clonus and negative Homans No significance to palpation greater trochanteric region iliotibial band region. Abdomen was soft nontender and no costovertebral maintenance was noted.      Assessment & Plan:   Degenerative disc disease lumbar spine  L1-L2 bulging disks, multilevel degenerative changes throughout the lumbar spine  Lumbar facet syndrome  Sacroiliac joint dysfunction  Degenerative joint disease of knee  Anxiety   Plan   Continue present medications Neurontin hydrocodone acetaminophen and fentanyl patch  F/U PCP  Lada  for evaliation of  BP and to discuss the medication lorazepam and for evaluation of general medical  condition. We've also recommended patient be considered for psych evaluation for adjustment of medications and to consider other medications for treatment of patient's anxiety   F/U surgical evaluation  F/U neurological evaluation  Consider psych evaluation of anxiety Patient will further address this with Dr.Lada    May consider radiofrequency rhizolysis or intraspinal procedures pending response to present treatment and F/U evaluation   Patient to call [facility] should patient have concerns prior to scheduled return appointmen.

## 2015-05-22 ENCOUNTER — Ambulatory Visit: Payer: Medicare Other | Attending: Pain Medicine | Admitting: Pain Medicine

## 2015-05-22 ENCOUNTER — Encounter: Payer: Self-pay | Admitting: Pain Medicine

## 2015-05-22 VITALS — BP 111/64 | HR 57 | Temp 98.1°F | Resp 16 | Ht 69.0 in | Wt 180.0 lb

## 2015-05-22 DIAGNOSIS — M5136 Other intervertebral disc degeneration, lumbar region: Secondary | ICD-10-CM | POA: Insufficient documentation

## 2015-05-22 DIAGNOSIS — M179 Osteoarthritis of knee, unspecified: Secondary | ICD-10-CM | POA: Insufficient documentation

## 2015-05-22 DIAGNOSIS — M79604 Pain in right leg: Secondary | ICD-10-CM | POA: Diagnosis present

## 2015-05-22 DIAGNOSIS — M5126 Other intervertebral disc displacement, lumbar region: Secondary | ICD-10-CM | POA: Insufficient documentation

## 2015-05-22 DIAGNOSIS — M545 Low back pain: Secondary | ICD-10-CM | POA: Diagnosis present

## 2015-05-22 DIAGNOSIS — M170 Bilateral primary osteoarthritis of knee: Secondary | ICD-10-CM

## 2015-05-22 DIAGNOSIS — M5416 Radiculopathy, lumbar region: Secondary | ICD-10-CM

## 2015-05-22 DIAGNOSIS — M79605 Pain in left leg: Secondary | ICD-10-CM | POA: Diagnosis present

## 2015-05-22 DIAGNOSIS — M533 Sacrococcygeal disorders, not elsewhere classified: Secondary | ICD-10-CM | POA: Insufficient documentation

## 2015-05-22 DIAGNOSIS — M47816 Spondylosis without myelopathy or radiculopathy, lumbar region: Secondary | ICD-10-CM | POA: Diagnosis not present

## 2015-05-22 DIAGNOSIS — F419 Anxiety disorder, unspecified: Secondary | ICD-10-CM | POA: Diagnosis not present

## 2015-05-22 DIAGNOSIS — M1712 Unilateral primary osteoarthritis, left knee: Secondary | ICD-10-CM

## 2015-05-22 MED ORDER — FENTANYL 50 MCG/HR TD PT72: MEDICATED_PATCH | TRANSDERMAL | Status: DC

## 2015-05-22 MED ORDER — HYDROCODONE-ACETAMINOPHEN 7.5-325 MG PO TABS: ORAL_TABLET | ORAL | Status: DC

## 2015-05-22 NOTE — Progress Notes (Signed)
   Subjective:    Patient ID: Tanner Baker, male    DOB: 01-09-1951, 64 y.o.   MRN: [id number]  HPI  Patient is 64 year old gentleman who [facility] for further evaluation and treatment of pain involving the lower back and lower extremity region. Patient is with pain well-controlled at the present medications. Patient denies trauma or change in events of daily living the cause change in symptomatology. Patient is to undergo excision of skin carcinomas. We've informed patient that he may receive medications for treatment of his postsurgical pain from his surgeon. Patient has been cautioned regarding respiratory depression confusion and other side effects which can occur with additional medication. Patient is understanding and agrees with ability to comply to avoid undesirable side effects as discussed. We will remain available to consider patient for treatment of lower back lower extremity pain including pain of the knees as needed. The patient was understanding and agreement status treatment plan.     Review of Systems     Objective:   Physical Exam  There was tenderness of the splenius capitis and occipitalis muscles of mild degree. There was mild tenderness of the acromioclavicular glenohumeral joint region. Palpation of the cervical facet cervical paraspinal musculature region reproduced mild discomfort. There was mild tends to palpation of the region of the thoracic facet thoracic paraspinal musculature region. Tinel and Phalen's maneuver were without increased pain of significant degree. No crepitus of the thoracic region was noted. Palpation over the region of the lumbar paraspinal muscles region lumbar facet region was associated with tends to palpation of mild to moderate degree. There was no increase of pain with lateral bending and rotation and extension and palpation of the lumbar facets. DTRs are difficult to elicit. DTRs appear to be trace at the knees.. There  was negative anterior and posterior drawer signs of the knees. No ballottement of the patella was noted. EHL strength was decreased. No definite sensory deficit of dermatomal distribution of the lower extremities noted. Straight leg raising was tolerates approximately 30 without an increase of pain with dorsiflexion noted. There was negative clonus negative Homans. Abdomen was nontender with no costovertebral angle tenderness noted.      Assessment & Plan:      Degenerative disc disease lumbar spine  L1-L2 bulging disks, multilevel degenerative changes throughout the lumbar spine  Lumbar facet syndrome  Sacroiliac joint dysfunction  Degenerative joint disease of knee  Anxiety     PLAN   Continue present medication Neurontin hydrocodone acetaminophen and fentanyl patch  F/U PCP Dr. Ouida Sills for evaliation of  BP skin carcinomas and general medical  condition  F/U surgical evaluation. May consider pending follow-up evaluations  F/U evaluation and treatment of skin carcinomas as planned  F/U neurological evaluation. May consider pending follow-up evaluations  May consider radiofrequency rhizolysis or intraspinal procedures pending response to present treatment and F/U evaluation   Patient to call [facility] should patient have concerns prior to scheduled return appointment.

## 2015-05-22 NOTE — Patient Instructions (Addendum)
PLAN   Continue present medication Neurontin hydrocodone acetaminophen and fentanyl patch  F/U PCP Dr. Ouida Sills for evaliation of  BP and general medical  condition  F/U surgical evaluation. May consider pending follow-up evaluations  F/U neurological evaluation. May consider pending follow-up evaluations  May consider radiofrequency rhizolysis or intraspinal procedures pending response to present treatment and F/U evaluation   Patient to call [facility] should patient have concerns prior to scheduled return appointment.

## 2015-05-22 NOTE — Progress Notes (Signed)
Safety precautions to be maintained throughout the outpatient stay will include: orient to surroundings, keep bed in low position, maintain call bell within reach at all times, provide assistance with transfer out of bed and ambulation.  

## 2015-05-29 ENCOUNTER — Telehealth: Payer: Self-pay | Admitting: Pain Medicine

## 2015-05-29 NOTE — Telephone Encounter (Signed)
Has changed Pharmacy to [facility] [place] / [phone]   / fax [phone]

## 2015-05-30 ENCOUNTER — Telehealth: Payer: Self-pay | Admitting: Pain Medicine

## 2015-05-30 NOTE — Telephone Encounter (Signed)
Nurses, Please inform patient that his surgeon may prescribe medications for his pain status post surgery of his nose. I feel that this is appropriate for the surgeon to prescribe the pain medication since the pain may require emergent surgical intervention and I want  to avoid masking any symptoms that would require surgical intervention or other treatment by the surgeon.

## 2015-05-30 NOTE — Telephone Encounter (Signed)
Patient notified that surgeon may prescribe medication for his pain.

## 2015-05-30 NOTE — Telephone Encounter (Signed)
Pt had nose surg and says Dr Phillip Heal talked with him and he would like Dr Primus Bravo to call him in some pain meds to the [facility] aid in [place]

## 2015-06-19 ENCOUNTER — Ambulatory Visit: Payer: Medicare Other | Attending: Pain Medicine | Admitting: Pain Medicine

## 2015-06-19 ENCOUNTER — Other Ambulatory Visit: Payer: Self-pay | Admitting: Pain Medicine

## 2015-06-19 ENCOUNTER — Encounter: Payer: Self-pay | Admitting: Pain Medicine

## 2015-06-19 VITALS — BP 119/65 | HR 54 | Temp 98.2°F | Resp 15 | Ht 69.0 in | Wt 182.0 lb

## 2015-06-19 DIAGNOSIS — M1712 Unilateral primary osteoarthritis, left knee: Secondary | ICD-10-CM

## 2015-06-19 DIAGNOSIS — M5136 Other intervertebral disc degeneration, lumbar region: Secondary | ICD-10-CM | POA: Diagnosis not present

## 2015-06-19 DIAGNOSIS — M5126 Other intervertebral disc displacement, lumbar region: Secondary | ICD-10-CM | POA: Diagnosis not present

## 2015-06-19 DIAGNOSIS — F419 Anxiety disorder, unspecified: Secondary | ICD-10-CM | POA: Diagnosis not present

## 2015-06-19 DIAGNOSIS — M170 Bilateral primary osteoarthritis of knee: Secondary | ICD-10-CM

## 2015-06-19 DIAGNOSIS — M545 Low back pain: Secondary | ICD-10-CM | POA: Diagnosis present

## 2015-06-19 DIAGNOSIS — M47816 Spondylosis without myelopathy or radiculopathy, lumbar region: Secondary | ICD-10-CM

## 2015-06-19 DIAGNOSIS — M5416 Radiculopathy, lumbar region: Secondary | ICD-10-CM

## 2015-06-19 DIAGNOSIS — M533 Sacrococcygeal disorders, not elsewhere classified: Secondary | ICD-10-CM

## 2015-06-19 DIAGNOSIS — M79604 Pain in right leg: Secondary | ICD-10-CM | POA: Diagnosis present

## 2015-06-19 DIAGNOSIS — M179 Osteoarthritis of knee, unspecified: Secondary | ICD-10-CM | POA: Diagnosis not present

## 2015-06-19 DIAGNOSIS — M79605 Pain in left leg: Secondary | ICD-10-CM | POA: Diagnosis present

## 2015-06-19 MED ORDER — GABAPENTIN 100 MG PO CAPS: ORAL_CAPSULE | ORAL | Status: DC

## 2015-06-19 MED ORDER — HYDROCODONE-ACETAMINOPHEN 7.5-325 MG PO TABS: ORAL_TABLET | ORAL | Status: DC

## 2015-06-19 MED ORDER — FENTANYL 50 MCG/HR TD PT72: MEDICATED_PATCH | TRANSDERMAL | Status: DC

## 2015-06-19 NOTE — Progress Notes (Signed)
   Subjective:    Patient ID: Tanner Baker, male    DOB: [birth year], 64 y.o.   MRN: [id number]  HPI  Patient is 64 year old gentleman returns a [facility] for further evaluation and treatment of pain involving the lower back and lower extremity region predominantly patient states that pain involving the lower back lower extremity a knees is well controlled at this time. Patient will continue to undergo dermatological evaluation with Dr.Graham for treatment of skin cancer as planned. We will continue present medications consisting of Neurontin hydrocodone acetaminophen and fentanyl patch as prescribed. The patient was with understanding and agreement with suggested treatment plan.     Review of Systems     Objective:   Physical Exam  There was mild tenderness over the paraspinal musculature region cervical region cervical facet region. There was mild tenderness of the splenius capitis and occipitalis musculature regions. Palpation over the thoracic facet thoracic paraspinal musculature region was with mild tenderness to palpation as well. There was unremarkable Spurling's maneuver and Tinel and Phalen's maneuver were without increased pain of significant degree. There was no crepitus of the thoracic region and palpation over the thoracic region was without increased pain of significant degree. Palpation over the lumbar paraspinal musculature and lumbar facet region was with minimal tenderness to palpation. Lateral bending and rotation and extension and palpation over the lumbar facets reproduced minimal discomfort. Straight leg raising was tolerates approximately 30 without increased pain with dorsiflexion noted. There was negative clonus negative Homans. Knees were with minimal tenderness to palpation with mild crepitus of the knees noted with negative anterior and posterior drawer signs and without ballottement of the patella. There was no increased laxity of the joint noted. There was  no sensory deficit of the lower extremities of dermatomal distribution and there was negative clonus and negative Homans. There was minimal tenderness of the greater trochanteric region and iliotibial band region. Abdomen was nontender with no costovertebral angle tenderness noted.      Assessment & Plan:    Degenerative disc disease lumbar spine  L1-L2 bulging disks, multilevel degenerative changes throughout the lumbar spine  Lumbar facet syndrome  Sacroiliac joint dysfunction  Degenerative joint disease of knee  Anxiety    PLAN   Continue present medication Neurontin hydrocodone acetaminophen and fentanyl patch  F/U PCP Dr. Ouida Sills  for evaliation of  BP and general medical  condition  F/U surgical evaluation.We will avoid at this time  F/U neurological evaluation. We will avoid at this time  Follow-up dermatological evaluation of skin carcinomas with Dr.Graham as planned and as scheduled   May consider radiofrequency rhizolysis or intraspinal procedures pending response to present treatment and F/U evaluation We will avoid at this time  Patient to call Pain Management Center should patient have concerns prior to scheduled return appointment.

## 2015-06-19 NOTE — Patient Instructions (Addendum)
PLAN   Continue present medication Neurontin hydrocodone acetaminophen and fentanyl patch  F/U PCP Dr. Ouida Sills for evaliation of  BP and general medical  condition  F/U surgical evaluation. May consider pending follow-up evaluations  F/U with dermatologist Dr. Phillip Heal as scheduled  F/U neurological evaluation. May consider pending follow-up evaluations  May consider radiofrequency rhizolysis or intraspinal procedures pending response to present treatment and F/U evaluation   Patient to call [facility] Management Center should patient have concerns prior to scheduled return appointment. You were given prescriptions for Hydrocodone, gabapentin, and Fentany  patch

## 2015-06-19 NOTE — Progress Notes (Signed)
Safety precautions to be maintained throughout the outpatient stay will include: orient to surroundings, keep bed in low position, maintain call bell within reach at all times, provide assistance with transfer out of bed and ambulation.  

## 2015-06-25 ENCOUNTER — Telehealth: Payer: Self-pay | Admitting: *Deleted

## 2015-06-25 ENCOUNTER — Telehealth: Payer: Self-pay

## 2015-06-25 NOTE — Telephone Encounter (Signed)
Left voicemail for patient to call ins co and ask them to resend paperwork for pain med approval. Checked our fax and have not gotten any faxes as mentioned.

## 2015-06-25 NOTE — Telephone Encounter (Signed)
Pt states that [facility] faxed papers to this office on Friday for Dr. Primus Bravo to sign so that pt can continue getting his fentanyl patch and hydrocodone pills. Pt would like a nurse to call and leave a message on his phone after Dr. Primus Bravo has signed the papers. Pt may still be in surgery this is why he is requesting that we leave a message on his voicemail

## 2015-06-25 NOTE — Telephone Encounter (Signed)
Patient call was from Dermatologist office in reference to patient wanting approval for pain medicine. Patient had surgery and is going to get rx of percocet 7.5/325 mg from Dr Marlane Hatcher. Dr Primus Bravo gave approval for the patient To get prescription.

## 2015-06-26 ENCOUNTER — Telehealth: Payer: Self-pay | Admitting: Pain Medicine

## 2015-06-26 NOTE — Telephone Encounter (Signed)
[employer] # [phone] to call and change tier on meds

## 2015-06-27 ENCOUNTER — Telehealth: Payer: Self-pay | Admitting: Pain Medicine

## 2015-06-27 NOTE — Telephone Encounter (Signed)
[employer] told patient they no longer faxed prior auth for tier change / He is wanting tier change for meds so they will not cost so much/  Number to call is [phone] / meds are Fentanyl Patch and Hydrocode

## 2015-06-27 NOTE — Telephone Encounter (Signed)
Awaiting fax from insurance company. Patient aware.

## 2015-06-28 ENCOUNTER — Telehealth: Payer: Self-pay | Admitting: *Deleted

## 2015-06-28 ENCOUNTER — Other Ambulatory Visit: Payer: Self-pay | Admitting: Pain Medicine

## 2015-06-28 NOTE — Telephone Encounter (Signed)
Patient called and information collected re: prior auth for Fentanyl/hydrocodone. Faxes completed and sent to [employer] via fax for approval per [employer] on 06-28-15. Patient aware.

## 2015-07-02 ENCOUNTER — Telehealth: Payer: Self-pay | Admitting: Pain Medicine

## 2015-07-02 NOTE — Telephone Encounter (Signed)
Nurses, Please call insurance company to see what they need in terms of medications for Mr. Tanner Baker

## 2015-07-02 NOTE — Telephone Encounter (Signed)
Ins. Says they need to speak with Dr. Primus Bravo [phone] regarding prior auth for scripts  Use Ref # [id number] and [id number]

## 2015-07-04 ENCOUNTER — Telehealth: Payer: Self-pay | Admitting: Pain Medicine

## 2015-07-04 NOTE — Telephone Encounter (Signed)
Tonique calling about prior Josem Kaufmann appeal for mr Balicki Phone [phone] ex [phone] Needs to ask questions regarding meds

## 2015-07-16 ENCOUNTER — Telehealth: Payer: Self-pay

## 2015-07-16 NOTE — Telephone Encounter (Signed)
Pt wants Dr. Primus Bravo to give him a cortisone shot in his knee tomorrow 11/15 when he comes.Marland KitchenMarland Kitchen

## 2015-07-16 NOTE — Telephone Encounter (Signed)
Coralyn Helling and Juliann Pulse Need to schedule injection of knee on a procedure day Please scheduled for Monday, November 21 or Monday, November 28

## 2015-07-17 ENCOUNTER — Telehealth: Payer: Self-pay | Admitting: Pain Medicine

## 2015-07-17 ENCOUNTER — Encounter: Payer: Self-pay | Admitting: Pain Medicine

## 2015-07-17 ENCOUNTER — Ambulatory Visit: Payer: Medicare Other | Attending: Pain Medicine | Admitting: Pain Medicine

## 2015-07-17 VITALS — BP 120/57 | HR 57 | Temp 98.4°F | Resp 15 | Ht 69.0 in | Wt 182.0 lb

## 2015-07-17 DIAGNOSIS — M5136 Other intervertebral disc degeneration, lumbar region: Secondary | ICD-10-CM | POA: Diagnosis not present

## 2015-07-17 DIAGNOSIS — M170 Bilateral primary osteoarthritis of knee: Secondary | ICD-10-CM

## 2015-07-17 DIAGNOSIS — M533 Sacrococcygeal disorders, not elsewhere classified: Secondary | ICD-10-CM | POA: Diagnosis not present

## 2015-07-17 DIAGNOSIS — M5416 Radiculopathy, lumbar region: Secondary | ICD-10-CM

## 2015-07-17 DIAGNOSIS — M1712 Unilateral primary osteoarthritis, left knee: Secondary | ICD-10-CM

## 2015-07-17 DIAGNOSIS — M546 Pain in thoracic spine: Secondary | ICD-10-CM | POA: Diagnosis present

## 2015-07-17 DIAGNOSIS — M5126 Other intervertebral disc displacement, lumbar region: Secondary | ICD-10-CM | POA: Diagnosis not present

## 2015-07-17 DIAGNOSIS — M47816 Spondylosis without myelopathy or radiculopathy, lumbar region: Secondary | ICD-10-CM | POA: Diagnosis not present

## 2015-07-17 DIAGNOSIS — F419 Anxiety disorder, unspecified: Secondary | ICD-10-CM | POA: Insufficient documentation

## 2015-07-17 DIAGNOSIS — M179 Osteoarthritis of knee, unspecified: Secondary | ICD-10-CM | POA: Diagnosis not present

## 2015-07-17 MED ORDER — HYDROCODONE-ACETAMINOPHEN 7.5-325 MG PO TABS: ORAL_TABLET | ORAL | Status: DC

## 2015-07-17 MED ORDER — FENTANYL 50 MCG/HR TD PT72: MEDICATED_PATCH | TRANSDERMAL | Status: DC

## 2015-07-17 NOTE — Telephone Encounter (Signed)
Went to dr Ouida Sills and dr Ouida Sills says ok for dr crisp to write meds

## 2015-07-17 NOTE — Progress Notes (Signed)
Safety precautions to be maintained throughout the outpatient stay will include: orient to surroundings, keep bed in low position, maintain call bell within reach at all times, provide assistance with transfer out of bed and ambulation.  

## 2015-07-17 NOTE — Patient Instructions (Addendum)
PLAN  cKnee Injection A knee injection is a procedure to get medicine into your knee joint. Your health care provider puts a needle into the joint and injects medicine with an attached syringe. The injected medicine may relieve the pain, swelling, and stiffness of arthritis. The injected medicine may also help to lubricate and cushion your knee joint. You may need more than one injection. LET [facility] CARE PROVIDER KNOW ABOUT:  Any allergies you have.  All medicines you are taking, including vitamins, herbs, eye drops, creams, and over-the-counter medicines.  Previous problems you or members of your family have had with the use of anesthetics.  Any blood disorders you have.  Previous surgeries you have had.  Any medical conditions you may have. RISKS AND COMPLICATIONS Generally, this is a safe procedure. However, problems may occur, including:  Infection.  Bleeding.  Worsening symptoms.  Damage to the area around your knee.  Allergic reaction to any of the medicines.  Skin reactions from repeated injections. BEFORE THE PROCEDURE  Ask your health care provider about changing or stopping your regular medicines. This is especially important if you are taking diabetes medicines or blood thinners.  Plan to have someone take you home after the procedure. PROCEDURE  You will sit or lie down in a position for your knee to be treated.  The skin over your kneecap will be cleaned with a germ-killing solution (antiseptic).  You will be given a medicine that numbs the area (local anesthetic). You may feel some stinging.  After your knee becomes numb, you will have a second injection. This is the medicine. This needle is carefully placed between your kneecap and your knee. The medicine is injected into the joint space.  At the end of the procedure, the needle will be removed.  A bandage (dressing) may be placed over the injection site. The procedure may vary among health care  providers and hospitals. AFTER THE PROCEDURE  You may have to move your knee through its full range of motion. This helps to get all of the medicine into your joint space.  Your blood pressure, heart rate, breathing rate, and blood oxygen level will be monitored often until the medicines you were given have worn off.  You will be watched to make sure that you do not have a reaction to the injected medicine.   This information is not intended to replace advice given to you by your health care provider. Make sure you discuss any questions you have with your health care provider.   Document Released: 11/09/2006 Document Revised: 09/08/2014 Document Reviewed: 06/28/2014 Elsevier Interactive Patient Education 2016 [place]  What are the risk, side effects and possible complications? Generally speaking, most procedures are safe.  However, with any procedure there are risks, side effects, and the possibility of complications.  The risks and complications are dependent upon the sites that are lesioned, or the type of nerve block to be performed.  The closer the procedure is to the spine, the more serious the risks are.  Great care is taken when placing the radio frequency needles, block needles or lesioning probes, but sometimes complications can occur.  Infection: Any time there is an injection through the skin, there is a risk of infection.  This is why sterile conditions are used for these blocks.  There are four possible types of infection.  Localized skin infection.  Central Nervous System Infection-This can be in the form of Meningitis, which can be deadly.  Epidural Infections-This can be in the form of an epidural abscess, which can cause pressure inside of the spine, causing compression of the spinal cord with subsequent paralysis. This would require an emergency surgery to decompress, and there are no guarantees that the patient would recover from the  paralysis.  Discitis-This is an infection of the intervertebral discs.  It occurs in about 1% of discography procedures.  It is difficult to treat and it may lead to surgery.        2. Pain: the needles have to go through skin and soft tissues, will cause soreness.       3. Damage to internal structures:  The nerves to be lesioned may be near blood vessels or    other nerves which can be potentially damaged.       4. Bleeding: Bleeding is more common if the patient is taking blood thinners such as  aspirin, Coumadin, Ticiid, Plavix, etc., or if he/she have some genetic predisposition  such as hemophilia. Bleeding into the spinal canal can cause compression of the spinal  cord with subsequent paralysis.  This would require an emergency surgery to  decompress and there are no guarantees that the patient would recover from the  paralysis.       5. Pneumothorax:  Puncturing of a lung is a possibility, every time a needle is introduced in  the area of the chest or upper back.  Pneumothorax refers to free air around the  collapsed lung(s), inside of the thoracic cavity (chest cavity).  Another two possible  complications related to a similar event would include: Hemothorax and Chylothorax.   These are variations of the Pneumothorax, where instead of air around the collapsed  lung(s), you may have blood or chyle, respectively.       6. Spinal headaches: They may occur with any procedures in the area of the spine.       7. Persistent CSF (Cerebro-Spinal Fluid) leakage: This is a rare problem, but may occur  with prolonged intrathecal or epidural catheters either due to the formation of a fistulous  track or a dural tear.       8. Nerve damage: By working so close to the spinal cord, there is always a possibility of  nerve damage, which could be as serious as a permanent spinal cord injury with  paralysis.       9. Death:  Although rare, severe deadly allergic reactions known as "Anaphylactic  reaction" can occur  to any of the medications used.      10. Worsening of the symptoms:  We can always make thing worse.  What are the chances of something like this happening? Chances of any of this occuring are extremely low.  By statistics, you have more of a chance of getting killed in a motor vehicle accident: while driving to the hospital than any of the above occurring .  Nevertheless, you should be aware that they are possibilities.  In general, it is similar to taking a shower.  Everybody knows that you can slip, hit your head and get killed.  Does that mean that you should not shower again?  Nevertheless always keep in mind that statistics do not mean anything if you happen to be on the wrong side of them.  Even if a procedure has a 1 (one) in a 1,000,000 (million) chance of going wrong, it you happen to be that one..Also, keep in mind that by statistics, you have more of a  chance of having something go wrong when taking medications.  Who should not have this procedure? If you are on a blood thinning medication (e.g. Coumadin, Plavix, see list of "Blood Thinners"), or if you have an active infection going on, you should not have the procedure.  If you are taking any blood thinners, please inform your physician.  How should I prepare for this procedure?  Do not eat or drink anything at least six hours prior to the procedure.  Bring a driver with you .  It cannot be a taxi.  Come accompanied by an adult that can drive you back, and that is strong enough to help you if your legs get weak or numb from the local anesthetic.  Take all of your medicines the morning of the procedure with just enough water to swallow them.  If you have diabetes, make sure that you are scheduled to have your procedure done first thing in the morning, whenever possible.  If you have diabetes, take only half of your insulin dose and notify our nurse that you have done so as soon as you arrive at the clinic.  If you are diabetic,  but only take blood sugar pills (oral hypoglycemic), then do not take them on the morning of your procedure.  You may take them after you have had the procedure.  Do not take aspirin or any aspirin-containing medications, at least eleven (11) days prior to the procedure.  They may prolong bleeding.  Wear loose fitting clothing that may be easy to take off and that you would not mind if it got stained with Betadine or blood.  Do not wear any jewelry or perfume  Remove any nail coloring.  It will interfere with some of our monitoring equipment.  NOTE: Remember that this is not meant to be interpreted as a complete list of all possible complications.  Unforeseen problems may occur.  BLOOD THINNERS The following drugs contain aspirin or other products, which can cause increased bleeding during surgery and should not be taken for 2 weeks prior to and 1 week after surgery.  If you should need take something for relief of minor pain, you may take acetaminophen which is found in Tylenol,m Datril, Anacin-3 and Panadol. It is not blood thinner. The products listed below are.  Do not take any of the products listed below in addition to any listed on your instruction sheet.  A.P.C or A.P.C with Codeine Codeine Phosphate Capsules #3 Ibuprofen Ridaura  ABC compound Congesprin Imuran rimadil  Advil Cope Indocin Robaxisal  Alka-Seltzer Effervescent Pain Reliever and Antacid Coricidin or Coricidin-D  Indomethacin Rufen  Alka-Seltzer plus Cold Medicine Cosprin Ketoprofen S-A-C Tablets  Anacin Analgesic Tablets or Capsules Coumadin Korlgesic Salflex  Anacin Extra Strength Analgesic tablets or capsules CP-2 Tablets Lanoril Salicylate  Anaprox Cuprimine Capsules Levenox Salocol  Anexsia-D Dalteparin Magan Salsalate  Anodynos Darvon compound Magnesium Salicylate Sine-off  Ansaid Dasin Capsules Magsal Sodium Salicylate  Anturane Depen Capsules Marnal Soma  APF Arthritis pain formula Dewitt's Pills Measurin  Stanback  Argesic Dia-Gesic Meclofenamic Sulfinpyrazone  Arthritis Bayer Timed Release Aspirin Diclofenac Meclomen Sulindac  Arthritis pain formula Anacin Dicumarol Medipren Supac  Analgesic (Safety coated) Arthralgen Diffunasal Mefanamic Suprofen  Arthritis Strength Bufferin Dihydrocodeine Mepro Compound Suprol  Arthropan liquid Dopirydamole Methcarbomol with Aspirin Synalgos  ASA tablets/Enseals Disalcid Micrainin Tagament  Ascriptin Doan's Midol Talwin  Ascriptin A/D Dolene Mobidin Tanderil  Ascriptin Extra Strength Dolobid Moblgesic Ticlid  Ascriptin with Codeine Doloprin or Doloprin with Codeine  Momentum Tolectin  Asperbuf Duoprin Mono-gesic Trendar  Aspergum Duradyne Motrin or Motrin IB Triminicin  Aspirin plain, buffered or enteric coated Durasal Myochrisine Trigesic  Aspirin Suppositories Easprin Nalfon Trillsate  Aspirin with Codeine Ecotrin Regular or Extra Strength Naprosyn Uracel  Atromid-S Efficin Naproxen Ursinus  Auranofin Capsules Elmiron Neocylate Vanquish  Axotal Emagrin Norgesic Verin  Azathioprine Empirin or Empirin with Codeine Normiflo Vitamin E  Azolid Emprazil Nuprin Voltaren  Bayer Aspirin plain, buffered or children's or timed BC Tablets or powders Encaprin Orgaran Warfarin Sodium  Buff-a-Comp Enoxaparin Orudis Zorpin  Buff-a-Comp with Codeine Equegesic Os-Cal-Gesic   Buffaprin Excedrin plain, buffered or Extra Strength Oxalid   Bufferin Arthritis Strength Feldene Oxphenbutazone   Bufferin plain or Extra Strength Feldene Capsules Oxycodone with Aspirin   Bufferin with Codeine Fenoprofen Fenoprofen Pabalate or Pabalate-SF   Buffets II Flogesic Panagesic   Buffinol plain or Extra Strength Florinal or Florinal with Codeine Panwarfarin   Buf-Tabs Flurbiprofen Penicillamine   Butalbital Compound Four-way cold tablets Penicillin   Butazolidin Fragmin Pepto-Bismol   Carbenicillin Geminisyn Percodan   Carna Arthritis Reliever Geopen Persantine   Carprofen Gold's  salt Persistin   Chloramphenicol Goody's Phenylbutazone   Chloromycetin Haltrain Piroxlcam   Clmetidine heparin Plaquenil   Cllnoril Hyco-pap Ponstel   Clofibrate Hydroxy chloroquine Propoxyphen         Before stopping any of these medications, be sure to consult the physician who ordered them.  Some, such as Coumadin (Warfarin) are ordered to prevent or treat serious conditions such as "deep thrombosis", "pumonary embolisms", and other heart problems.  The amount of time that you may need off of the medication may also vary with the medication and the reason for which you were taking it.  If you are taking any of these medications, please make sure you notify your pain physician before you undergo any procedures.

## 2015-07-17 NOTE — Progress Notes (Signed)
   Subjective:    Patient ID: Tanner Baker, male    DOB: [birth year], 64 y.o.   MRN: [id number]  HPI  The patient is a 64 year old gentleman who returns to pain management Center for further evaluation and treatment of pain involving the upper mid and lower back and lower extremity region. Patient's pain present time involves the region of the left knee. Patient states that the lower back lower extremity pain is fairly well-controlled present treatment regimen consisting of Neurontin hydrocodone acetaminophen and fentanyl patch. Patient states that the left knee pain has become quite severe and it appears all activities of daily living including standing walking as well as sleeping. We discussed patient's condition and will proceed with geniculate nerve blocks of the left knee at time of return appointment in attempt to decrease severity of patient's symptoms, minimize progression of patient's symptoms, and avoid the need for more involved treatment. The patient agreed to suggested treatment plan. The patient denied any trauma change in events of daily living to cause change in symptomatology. Patient stated the lower back lower extremity pain is aggravated by standing walking and becomes more intense as the day progresses. Patient's predominant pain involves the left knee at this time.     Review of Systems     Objective:   Physical Exam There was tenderness to palpation of the splenius capitis and occipitalis musculature regions of mild degree. There was mild tenderness to palpation of the acromioclavicular and glenohumeral joint regions. Patient appeared to be unremarkable Spurling's maneuver. Patient was with bilaterally equal grip strength. Tinel and Phalen's maneuver were without increase of pain of significant degree. Palpation over the thoracic facet thoracic paraspinal muscles reproduced pain of moderate degree in the lower thoracic paraspinal musculature region. No crepitus of the thoracic  region was noted. Palpation over the lumbar paraspinal muscles lumbar facet region was attends to palpation with lateral bending and rotation extension and palpation of the lumbar facets reproduced moderate discomfort. There was leg raising tolerate approximately 20 without increased pain with dorsiflexion noted. The left knee was attends to palpation with increased pain with range of motion maneuvers of the knee. There was no increased warmth and erythema in the region of the knee. There was negative anterior and posterior drawer signs. There was severe increased pain with range of motion maneuvers of the knee. No definite sensory deficit or dermatomal distribution of the lower extremities noted. Abdomen soft and nontender with no costovertebral tenderness noted.       Assessment & Plan:     Degenerative disc disease lumbar spine  L1-L2 bulging disks, multilevel degenerative changes throughout the lumbar spine  Lumbar facet syndrome  Sacroiliac joint dysfunction  Degenerative joint disease of knee  Anxiety     Continue present medications Neurontin hydrocodone acetaminophen and  Geniculate nerve blocks that need to be performed at time of return appointment  F/U PCP Dr. Ouida Sills for evaliation of  BP and general medical  condition. Patient will also discussed non-steroidal anti-inflammatory medication with Dr. Ouida Sills and we may consider prescribing nonsteroidal anti-inflammatory medication the patient pending discussion with Dr. Ouida Sills   F/U surgical evaluation. May consider pending follow-up evaluation  F/U neurological evaluation. May consider pending follow-up evaluations  May consider radiofrequency rhizolysis or intraspinal procedures pending response to present treatment and F/U evaluation   Patient to call Pain Management Center should patient have concerns prior to scheduled return appointment.

## 2015-07-18 ENCOUNTER — Ambulatory Visit: Payer: Medicare Other | Attending: Pain Medicine | Admitting: Pain Medicine

## 2015-07-18 ENCOUNTER — Encounter: Payer: Self-pay | Admitting: Pain Medicine

## 2015-07-18 VITALS — BP 110/59 | HR 56 | Temp 98.1°F | Resp 18

## 2015-07-18 DIAGNOSIS — M25562 Pain in left knee: Secondary | ICD-10-CM | POA: Diagnosis present

## 2015-07-18 DIAGNOSIS — M5416 Radiculopathy, lumbar region: Secondary | ICD-10-CM

## 2015-07-18 DIAGNOSIS — M545 Low back pain: Secondary | ICD-10-CM | POA: Diagnosis present

## 2015-07-18 DIAGNOSIS — M5136 Other intervertebral disc degeneration, lumbar region: Secondary | ICD-10-CM

## 2015-07-18 DIAGNOSIS — M1712 Unilateral primary osteoarthritis, left knee: Secondary | ICD-10-CM

## 2015-07-18 DIAGNOSIS — M533 Sacrococcygeal disorders, not elsewhere classified: Secondary | ICD-10-CM

## 2015-07-18 DIAGNOSIS — M179 Osteoarthritis of knee, unspecified: Secondary | ICD-10-CM | POA: Insufficient documentation

## 2015-07-18 DIAGNOSIS — M170 Bilateral primary osteoarthritis of knee: Secondary | ICD-10-CM

## 2015-07-18 DIAGNOSIS — M47816 Spondylosis without myelopathy or radiculopathy, lumbar region: Secondary | ICD-10-CM

## 2015-07-18 MED ORDER — BUPIVACAINE HCL (PF) 0.25 % IJ SOLN
INTRAMUSCULAR | Status: AC
  Administered 2015-07-18: 30 mL
  Filled 2015-07-18: qty 30

## 2015-07-18 MED ORDER — LACTATED RINGERS IV SOLN: 1000.0000 mL | INTRAVENOUS | Status: DC

## 2015-07-18 MED ORDER — MIDAZOLAM HCL 5 MG/5ML IJ SOLN
INTRAMUSCULAR | Status: AC
  Administered 2015-07-18: 1 mg via INTRAVENOUS
  Filled 2015-07-18: qty 5

## 2015-07-18 MED ORDER — FENTANYL CITRATE (PF) 100 MCG/2ML IJ SOLN
INTRAMUSCULAR | Status: AC
  Administered 2015-07-18: 50 ug via INTRAVENOUS
  Filled 2015-07-18: qty 2

## 2015-07-18 MED ORDER — MIDAZOLAM HCL 5 MG/5ML IJ SOLN: 5.0000 mg | Freq: Once | INTRAMUSCULAR | Status: DC

## 2015-07-18 MED ORDER — TRIAMCINOLONE ACETONIDE 40 MG/ML IJ SUSP: 40.0000 mg | Freq: Once | INTRAMUSCULAR | Status: DC

## 2015-07-18 MED ORDER — CEFAZOLIN SODIUM 1 G IJ SOLR
INTRAMUSCULAR | Status: AC
  Administered 2015-07-18: 1 g via INTRAVENOUS
  Filled 2015-07-18: qty 10

## 2015-07-18 MED ORDER — BUPIVACAINE HCL (PF) 0.25 % IJ SOLN: 30.0000 mL | Freq: Once | INTRAMUSCULAR | Status: DC

## 2015-07-18 MED ORDER — TRIAMCINOLONE ACETONIDE 40 MG/ML IJ SUSP
INTRAMUSCULAR | Status: AC
  Administered 2015-07-18: 40 mg
  Filled 2015-07-18: qty 1

## 2015-07-18 MED ORDER — CEFUROXIME AXETIL 250 MG PO TABS: 250.0000 mg | ORAL_TABLET | Freq: Two times a day (BID) | ORAL | Status: DC

## 2015-07-18 MED ORDER — FENTANYL CITRATE (PF) 100 MCG/2ML IJ SOLN: 100.0000 ug | Freq: Once | INTRAMUSCULAR | Status: DC

## 2015-07-18 MED ORDER — CEFAZOLIN SODIUM 1-5 GM-% IV SOLN: 1.0000 g | Freq: Once | INTRAVENOUS | Status: DC

## 2015-07-18 MED ORDER — ORPHENADRINE CITRATE 30 MG/ML IJ SOLN
INTRAMUSCULAR | Status: AC
  Filled 2015-07-18: qty 2

## 2015-07-18 NOTE — Progress Notes (Signed)
Subjective:    Patient ID: Tanner Baker, male    DOB: 11-Nov-1950, 65 y.o.   MRN: [id number]  HPI Geniculate nerve blocks of the left knee   The patient is a 64 y.o. male who [facility] the [facility] for further evaluation and treatment of pain involving the lumbar lower extremity region with severe pain of the left knee. Prior studies reveal patient to be with significant degenerative joint disease of the knee.  We will proceed with geniculate nerve blocks of the left knee in an attempt to decrease severity of symptoms, minimize the risk of medication escalation, hopefully retard progression of symptoms and avoid the need for more involved treatment.  The risks benefits and expectations of the procedure were discussed with the patient. The patient was with understanding and in agreement with suggested treatment plan.  DESCRIPTION OF PROCEDURE: Geniculate nerve blocks of the left knee. The  procedure was performed with IV Versed and IV fentanyl, conscious sedation and under fluoroscopic guidance.  NEEDLE PLACEMENT FOR BLOCK OF THE LATERAL SUPERIOR GENICULATE NERVE: The patient was taking to the fluoroscopy suite. With the patient supine, with knee in flexed position, Betadine prep of proposed entry site accomplished.  IV Versed, IV fentanyl conscious sedation, EKG, blood pressure, pulse and pulse oximetry monitoring were all in place. Under fluoroscopic guidance, a 22-gauge needle was inserted in the region of the  left knee with needle placed at the lateral border of the femur at the junction of the shaft of the femur and the condyle of the femur.  Following needle placement at the lateral aspect of the knee, needle placement was then accomplished in the region of the medial aspect of the knee.  NEEDLE PLACEMENT FOR BLOCK OF THE MEDIAL SUPERIOR GENICULATE NERVE:  Under fluoroscopic guidance, a 22 - gauge needle was inserted in the region of the  left knee with needle placed at the  medial border of the femur at the junction of the shaft of the femur and the condyle of the femur.   NEEDLE PLACEMENT FOR BLOCK OF THE MEDIAL INFERIOR GENICULATE NERVE:  Under fluoroscopic guidance, a 22 - gauge needle was inserted in the region of the  left knee with needle placed at the junction of the shaft and plateau of the tibia.   Following needle placement on AP view of needles placed in all three locations, placement was then verified on lateral view with the tips of the superior lateral and superior medial needles documented to be one half the distance of the shaft of the femur and the tip of the inferior medial geniculate needle documented to be one half the distance of the shaft of the tibia.  Following documentation of needle placements on lateral view, each needle was injected with one mL of 0.25% bupivacaine with Kenalog. A total of 10 mg of Kenalog was utilized for the entire procedure. The patient tolerated the procedure well.    PLAN 1. Medications: Continue present medications   Neurontin hydrocodone  acetaminophen and fentanyl 2. Follow-up appointment with PCP  Dr.Anderson for evaluation of blood pressure and general medical condition. 3. Follow-up surgical evaluation 4. Follow-up neurological evaluation 5. He patient may be a candidate for radiofrequency rhizolysis and other treatment pending response to treatment on today's visit and follow-up evaluation. 6. The patient is advised to adhere to proper body mechanics and avoid activities which appear to aggravate condition    Review of Systems     Objective:   Physical  Exam        Assessment & Plan:

## 2015-07-18 NOTE — Patient Instructions (Addendum)
PLAN   Continue present medication Neurontin hydrocodone acetaminophen and fentanyl patch. Please get Ceftin antibiotic today and begin taking Ceftin today as prescribed  F/U PCP Dr. Ouida Sills for evaliation of  BP and general medical  condition  F/U surgical evaluation. May consider pending follow-up evaluations  F/U with dermatologist Dr. Phillip Heal as scheduled  F/U neurological evaluation. May consider pending follow-up evaluations  May consider radiofrequency rhizolysis or intraspinal procedures pending response to present treatment and F/U evaluation   Patient to call [facility] Management Center should patient have concerns prior to scheduled return appointment.Knee Injection A knee injection is a procedure to get medicine into your knee joint. Your health care provider puts a needle into the joint and injects medicine with an attached syringe. The injected medicine may relieve the pain, swelling, and stiffness of arthritis. The injected medicine may also help to lubricate and cushion your knee joint. You may need more than one injection. LET [facility] CARE PROVIDER KNOW ABOUT:  Any allergies you have.  All medicines you are taking, including vitamins, herbs, eye drops, creams, and over-the-counter medicines.  Previous problems you or members of your family have had with the use of anesthetics.  Any blood disorders you have.  Previous surgeries you have had.  Any medical conditions you may have. RISKS AND COMPLICATIONS Generally, this is a safe procedure. However, problems may occur, including:  Infection.  Bleeding.  Worsening symptoms.  Damage to the area around your knee.  Allergic reaction to any of the medicines.  Skin reactions from repeated injections. BEFORE THE PROCEDURE  Ask your health care provider about changing or stopping your regular medicines. This is especially important if you are taking diabetes medicines or blood thinners.  Plan to have someone take  you home after the procedure. PROCEDURE  You will sit or lie down in a position for your knee to be treated.  The skin over your kneecap will be cleaned with a germ-killing solution (antiseptic).  You will be given a medicine that numbs the area (local anesthetic). You may feel some stinging.  After your knee becomes numb, you will have a second injection. This is the medicine. This needle is carefully placed between your kneecap and your knee. The medicine is injected into the joint space.  At the end of the procedure, the needle will be removed.  A bandage (dressing) may be placed over the injection site. The procedure may vary among health care providers and hospitals. AFTER THE PROCEDURE  You may have to move your knee through its full range of motion. This helps to get all of the medicine into your joint space.  Your blood pressure, heart rate, breathing rate, and blood oxygen level will be monitored often until the medicines you were given have worn off.  You will be watched to make sure that you do not have a reaction to the injected medicine.   This information is not intended to replace advice given to you by your health care provider. Make sure you discuss any questions you have with your health care provider.   Document Released: 11/09/2006 Document Revised: 09/08/2014 Document Reviewed: 06/28/2014 Elsevier Interactive Patient Education 2016 [employer] Pain Management Discharge Instructions  General Discharge Instructions :  If you need to reach your doctor call: Monday-Friday 8:00 am - 4:00 pm at [phone] or toll free [phone].  After clinic hours [phone] to have operator reach doctor.  Bring all of your medication bottles to all your appointments in the pain  clinic.  To cancel or reschedule your appointment with Pain Management please remember to call 24 hours in advance to avoid a fee.  Refer to the educational materials which you have been given  on: General Risks, I had my Procedure. Discharge Instructions, Post Sedation.  Post Procedure Instructions:  The drugs you were given will stay in your system until tomorrow, so for the next 24 hours you should not drive, make any legal decisions or drink any alcoholic beverages.  You may eat anything you prefer, but it is better to start with liquids then soups and crackers, and gradually work up to solid foods.  Please notify your doctor immediately if you have any unusual bleeding, trouble breathing or pain that is not related to your normal pain.  Depending on the type of procedure that was done, some parts of your body may feel week and/or numb.  This usually clears up by tonight or the next day.  Walk with the use of an assistive device or accompanied by an adult for the 24 hours.  You may use ice on the affected area for the first 24 hours.  Put ice in a Ziploc bag and cover with a towel and place against area 15 minutes on 15 minutes off.  You may switch to heat after 24 hours.

## 2015-07-18 NOTE — Progress Notes (Signed)
Safety precautions to be maintained throughout the outpatient stay will include: orient to surroundings, keep bed in low position, maintain call bell within reach at all times, provide assistance with transfer out of bed and ambulation.  

## 2015-07-19 ENCOUNTER — Telehealth: Payer: Self-pay | Admitting: *Deleted

## 2015-07-19 NOTE — Telephone Encounter (Signed)
Patient verbalizes no complications from procedure.  

## 2015-08-14 ENCOUNTER — Ambulatory Visit: Payer: Medicare Other | Attending: Pain Medicine | Admitting: Pain Medicine

## 2015-08-14 ENCOUNTER — Encounter: Payer: Self-pay | Admitting: Pain Medicine

## 2015-08-14 VITALS — BP 104/57 | HR 55 | Temp 97.7°F | Resp 15 | Ht 69.0 in | Wt 182.0 lb

## 2015-08-14 DIAGNOSIS — M1712 Unilateral primary osteoarthritis, left knee: Secondary | ICD-10-CM

## 2015-08-14 DIAGNOSIS — M5481 Occipital neuralgia: Secondary | ICD-10-CM | POA: Insufficient documentation

## 2015-08-14 DIAGNOSIS — G43909 Migraine, unspecified, not intractable, without status migrainosus: Secondary | ICD-10-CM | POA: Diagnosis not present

## 2015-08-14 DIAGNOSIS — M79604 Pain in right leg: Secondary | ICD-10-CM | POA: Diagnosis present

## 2015-08-14 DIAGNOSIS — R51 Headache: Secondary | ICD-10-CM | POA: Diagnosis not present

## 2015-08-14 DIAGNOSIS — M170 Bilateral primary osteoarthritis of knee: Secondary | ICD-10-CM

## 2015-08-14 DIAGNOSIS — M545 Low back pain: Secondary | ICD-10-CM | POA: Diagnosis present

## 2015-08-14 DIAGNOSIS — M179 Osteoarthritis of knee, unspecified: Secondary | ICD-10-CM | POA: Insufficient documentation

## 2015-08-14 DIAGNOSIS — M533 Sacrococcygeal disorders, not elsewhere classified: Secondary | ICD-10-CM

## 2015-08-14 DIAGNOSIS — F419 Anxiety disorder, unspecified: Secondary | ICD-10-CM | POA: Insufficient documentation

## 2015-08-14 DIAGNOSIS — M5126 Other intervertebral disc displacement, lumbar region: Secondary | ICD-10-CM | POA: Diagnosis not present

## 2015-08-14 DIAGNOSIS — M5136 Other intervertebral disc degeneration, lumbar region: Secondary | ICD-10-CM | POA: Diagnosis not present

## 2015-08-14 DIAGNOSIS — M47816 Spondylosis without myelopathy or radiculopathy, lumbar region: Secondary | ICD-10-CM

## 2015-08-14 DIAGNOSIS — M79605 Pain in left leg: Secondary | ICD-10-CM | POA: Diagnosis present

## 2015-08-14 DIAGNOSIS — M5416 Radiculopathy, lumbar region: Secondary | ICD-10-CM

## 2015-08-14 MED ORDER — GABAPENTIN 100 MG PO CAPS: ORAL_CAPSULE | ORAL | Status: DC

## 2015-08-14 MED ORDER — HYDROCODONE-ACETAMINOPHEN 7.5-325 MG PO TABS: ORAL_TABLET | ORAL | Status: DC

## 2015-08-14 MED ORDER — FENTANYL 50 MCG/HR TD PT72: MEDICATED_PATCH | TRANSDERMAL | Status: DC

## 2015-08-14 MED ORDER — CELECOXIB 100 MG PO CAPS: ORAL_CAPSULE | ORAL | Status: DC

## 2015-08-14 NOTE — Progress Notes (Signed)
Safety precautions to be maintained throughout the outpatient stay will include: orient to surroundings, keep bed in low position, maintain call bell within reach at all times, provide assistance with transfer out of bed and ambulation.  

## 2015-08-14 NOTE — Progress Notes (Signed)
   Subjective:    Patient ID: Tanner Baker, male    DOB: 1951/07/05, 64 y.o.   MRN: [id number]  HPI  Patient is a 64 year old, who returns to pain management for further evaluation and treatment of pain involving the lower back and lower extremity region predominantly with pain involving the region of the knees and lower back region as well as history of headaches. Patient is discussed taking noncontrol anti-inflammatory medication with his primary care physician Dr. Ouida Sills. Dr. Ouida Sills has agreed to allow patient to try nonsteroidal anti-inflammatory medication. We will begin Celebrex and will consider additional modifications of treatment regimen pending response to Celebrex . The caution patient regarding hepatic renal and GI side effects such as gastric ulcers. The patient to call pain management as well as Dr. Ouida Sills should he experience any undesirable side effects due to the addition of Celebrex have a concerns regarding his condition. Pending response to Celebrex and follow-up evaluation we may consider patient for interventional treatment for headaches which appears to be with component greater occipital neuralgia myofascial pain related headaches with concern regarding component of migraine headache. We will consider interventional treatment pending response to present treatment regimen. The patient was understanding and agreement suggested treatment plan     Review of Systems     Objective:   Physical Exam  There was tenderness to palpation of paraspinal musculature and cervical region cervical facet region a moderate degree with moderate to moderately severe tenderness to palpation of the splenius capitis and occipitalis musculature region. No masses of the head and neck were noted. There was tenderness to palpation over the cervical facet cervical paraspinal muscular treat and a moderate degree. Patient appeared to be with moderate tends to palpation over the acromioclavicular  and glenohumeral joint regions and appeared to be unremarkable Spurling's maneuver. Palpation over the thoracic facet thoracic paraspinal muscular treat and was with mild to moderate tenderness to palpation. No crepitus of the thoracic region was noted. Palpation over the lumbar paraspinal must reason lumbar facet region was with mild to moderate discomfort. Lateral bending rotation extension and palpation of the lumbar facets reproduce mild to moderate discomfort. Straight leg raising was tolerated to possibly 30 without increased pain with dorsiflexion noted. There was mild tinnitus to palpation of the knee on the left with negative anterior and posterior drawer signs without ballottement of the patella. No increased warmth erythema of the knee was noted. EHL strength appeared to be slightly decreased. There was negative clonus negative Homans. No sensory deficit or dermatomal distribution detected. Abdomen soft nontender with no costovertebral tenderness noted.      Assessment & Plan:   Bilateral occipital neuralgia  Migraine headache  Degenerative disc disease lumbar spine  L1-L2 bulging disks, multilevel degenerative changes throughout the lumbar spine  Lumbar facet syndrome  Sacroiliac joint dysfunction  Degenerative joint disease of knee  Anxiety      PLAN   Continue present medication Neurontin hydrocodone acetaminophen and fentanyl patch Begin Celebrex as discussed and as approved by your  F/U PCP Dr. Ouida Sills for evaliation of  BP and general medical  condition  F/U surgical evaluation. May consider pending follow-up evaluations  F/U with dermatologist Dr. Phillip Heal as scheduled  F/U neurological evaluation. May consider pending follow-up evaluations  May consider radiofrequency rhizolysis or intraspinal procedures pending response to present treatment and F/U evaluation   Patient to call Pain Management Center should patient have concerns prior to scheduled return  appointment.

## 2015-08-14 NOTE — Patient Instructions (Addendum)
PLAN   Continue present medication Neurontin hydrocodone acetaminophen and fentanyl patch Begin Celebrex as discussed and as approved by your  F/U PCP Dr. Ouida Sills for evaliation of  BP and general medical  condition  F/U surgical evaluation. May consider pending follow-up evaluations  F/U with dermatologist Dr. Phillip Heal as scheduled  F/U neurological evaluation. May consider pending follow-up evaluations  May consider radiofrequency rhizolysis or intraspinal procedures pending response to present treatment and F/U evaluation   Patient to call Pain Management Center should patient have concerns prior to scheduled return appointment.

## 2015-09-11 ENCOUNTER — Ambulatory Visit: Payer: Medicare Other | Attending: Pain Medicine | Admitting: Pain Medicine

## 2015-09-11 ENCOUNTER — Encounter: Payer: Self-pay | Admitting: Pain Medicine

## 2015-09-11 VITALS — BP 102/61 | HR 55 | Temp 98.4°F | Resp 15 | Ht 69.0 in | Wt 179.0 lb

## 2015-09-11 DIAGNOSIS — M5126 Other intervertebral disc displacement, lumbar region: Secondary | ICD-10-CM | POA: Insufficient documentation

## 2015-09-11 DIAGNOSIS — G43909 Migraine, unspecified, not intractable, without status migrainosus: Secondary | ICD-10-CM | POA: Diagnosis not present

## 2015-09-11 DIAGNOSIS — M1712 Unilateral primary osteoarthritis, left knee: Secondary | ICD-10-CM

## 2015-09-11 DIAGNOSIS — M5136 Other intervertebral disc degeneration, lumbar region: Secondary | ICD-10-CM | POA: Insufficient documentation

## 2015-09-11 DIAGNOSIS — M5481 Occipital neuralgia: Secondary | ICD-10-CM | POA: Diagnosis not present

## 2015-09-11 DIAGNOSIS — M179 Osteoarthritis of knee, unspecified: Secondary | ICD-10-CM | POA: Diagnosis not present

## 2015-09-11 DIAGNOSIS — M533 Sacrococcygeal disorders, not elsewhere classified: Secondary | ICD-10-CM | POA: Diagnosis not present

## 2015-09-11 DIAGNOSIS — M47816 Spondylosis without myelopathy or radiculopathy, lumbar region: Secondary | ICD-10-CM | POA: Diagnosis not present

## 2015-09-11 DIAGNOSIS — M170 Bilateral primary osteoarthritis of knee: Secondary | ICD-10-CM

## 2015-09-11 DIAGNOSIS — R52 Pain, unspecified: Secondary | ICD-10-CM | POA: Diagnosis present

## 2015-09-11 DIAGNOSIS — M5416 Radiculopathy, lumbar region: Secondary | ICD-10-CM

## 2015-09-11 MED ORDER — CELECOXIB 100 MG PO CAPS: ORAL_CAPSULE | ORAL | Status: DC

## 2015-09-11 MED ORDER — FENTANYL 50 MCG/HR TD PT72: MEDICATED_PATCH | TRANSDERMAL | Status: DC

## 2015-09-11 MED ORDER — HYDROCODONE-ACETAMINOPHEN 7.5-325 MG PO TABS: ORAL_TABLET | ORAL | Status: DC

## 2015-09-11 NOTE — Patient Instructions (Addendum)
PLAN   Continue present medication Neurontin Celebrex hydrocodone acetaminophen and fentanyl patch   F/U PCP Dr. Ouida Sills for evaliation of  BP and general medical  condition  F/U surgical evaluation. May consider pending follow-up evaluations  F/U with dermatologist Dr. Phillip Heal as scheduled  F/U neurological evaluation. May consider pending follow-up evaluations  May consider radiofrequency rhizolysis or intraspinal procedures pending response to present treatment and F/U evaluation   Patient to call Pain Management Center should patient have concerns prior to scheduled return appointment.GENERAL RISKS AND COMPLICATIONS  What are the risk, side effects and possible complications? Generally speaking, most procedures are safe.  However, with any procedure there are risks, side effects, and the possibility of complications.  The risks and complications are dependent upon the sites that are lesioned, or the type of nerve block to be performed.  The closer the procedure is to the spine, the more serious the risks are.  Great care is taken when placing the radio frequency needles, block needles or lesioning probes, but sometimes complications can occur. 1. Infection: Any time there is an injection through the skin, there is a risk of infection.  This is why sterile conditions are used for these blocks.  There are four possible types of infection. 1. Localized skin infection. 2. Central Nervous System Infection-This can be in the form of Meningitis, which can be deadly. 3. Epidural Infections-This can be in the form of an epidural abscess, which can cause pressure inside of the spine, causing compression of the spinal cord with subsequent paralysis. This would require an emergency surgery to decompress, and there are no guarantees that the patient would recover from the paralysis. 4. Discitis-This is an infection of the intervertebral discs.  It occurs in about 1% of discography procedures.  It is  difficult to treat and it may lead to surgery.        2. Pain: the needles have to go through skin and soft tissues, will cause soreness.       3. Damage to internal structures:  The nerves to be lesioned may be near blood vessels or    other nerves which can be potentially damaged.       4. Bleeding: Bleeding is more common if the patient is taking blood thinners such as  aspirin, Coumadin, Ticiid, Plavix, etc., or if he/she have some genetic predisposition  such as hemophilia. Bleeding into the spinal canal can cause compression of the spinal  cord with subsequent paralysis.  This would require an emergency surgery to  decompress and there are no guarantees that the patient would recover from the  paralysis.       5. Pneumothorax:  Puncturing of a lung is a possibility, every time a needle is introduced in  the area of the chest or upper back.  Pneumothorax refers to free air around the  collapsed lung(s), inside of the thoracic cavity (chest cavity).  Another two possible  complications related to a similar event would include: Hemothorax and Chylothorax.   These are variations of the Pneumothorax, where instead of air around the collapsed  lung(s), you may have blood or chyle, respectively.       6. Spinal headaches: They may occur with any procedures in the area of the spine.       7. Persistent CSF (Cerebro-Spinal Fluid) leakage: This is a rare problem, but may occur  with prolonged intrathecal or epidural catheters either due to the formation of a fistulous  track or a  dural tear.       8. Nerve damage: By working so close to the spinal cord, there is always a possibility of  nerve damage, which could be as serious as a permanent spinal cord injury with  paralysis.       9. Death:  Although rare, severe deadly allergic reactions known as "Anaphylactic  reaction" can occur to any of the medications used.      10. Worsening of the symptoms:  We can always make thing worse.  What are the chances of  something like this happening? Chances of any of this occuring are extremely low.  By statistics, you have more of a chance of getting killed in a motor vehicle accident: while driving to the hospital than any of the above occurring .  Nevertheless, you should be aware that they are possibilities.  In general, it is similar to taking a shower.  Everybody knows that you can slip, hit your head and get killed.  Does that mean that you should not shower again?  Nevertheless always keep in mind that statistics do not mean anything if you happen to be on the wrong side of them.  Even if a procedure has a 1 (one) in a 1,000,000 (million) chance of going wrong, it you happen to be that one..Also, keep in mind that by statistics, you have more of a chance of having something go wrong when taking medications.  Who should not have this procedure? If you are on a blood thinning medication (e.g. Coumadin, Plavix, see list of "Blood Thinners"), or if you have an active infection going on, you should not have the procedure.  If you are taking any blood thinners, please inform your physician.  How should I prepare for this procedure?  Do not eat or drink anything at least six hours prior to the procedure.  Bring a driver with you .  It cannot be a taxi.  Come accompanied by an adult that can drive you back, and that is strong enough to help you if your legs get weak or numb from the local anesthetic.  Take all of your medicines the morning of the procedure with just enough water to swallow them.  If you have diabetes, make sure that you are scheduled to have your procedure done first thing in the morning, whenever possible.  If you have diabetes, take only half of your insulin dose and notify our nurse that you have done so as soon as you arrive at the clinic.  If you are diabetic, but only take blood sugar pills (oral hypoglycemic), then do not take them on the morning of your procedure.  You may take them  after you have had the procedure.  Do not take aspirin or any aspirin-containing medications, at least eleven (11) days prior to the procedure.  They may prolong bleeding.  Wear loose fitting clothing that may be easy to take off and that you would not mind if it got stained with Betadine or blood.  Do not wear any jewelry or perfume  Remove any nail coloring.  It will interfere with some of our monitoring equipment.  NOTE: Remember that this is not meant to be interpreted as a complete list of all possible complications.  Unforeseen problems may occur.  BLOOD THINNERS The following drugs contain aspirin or other products, which can cause increased bleeding during surgery and should not be taken for 2 weeks prior to and 1 week after surgery.  If you  should need take something for relief of minor pain, you may take acetaminophen which is found in Tylenol,m Datril, Anacin-3 and Panadol. It is not blood thinner. The products listed below are.  Do not take any of the products listed below in addition to any listed on your instruction sheet.  A.P.C or A.P.C with Codeine Codeine Phosphate Capsules #3 Ibuprofen Ridaura  ABC compound Congesprin Imuran rimadil  Advil Cope Indocin Robaxisal  Alka-Seltzer Effervescent Pain Reliever and Antacid Coricidin or Coricidin-D  Indomethacin Rufen  Alka-Seltzer plus Cold Medicine Cosprin Ketoprofen S-A-C Tablets  Anacin Analgesic Tablets or Capsules Coumadin Korlgesic Salflex  Anacin Extra Strength Analgesic tablets or capsules CP-2 Tablets Lanoril Salicylate  Anaprox Cuprimine Capsules Levenox Salocol  Anexsia-D Dalteparin Magan Salsalate  Anodynos Darvon compound Magnesium Salicylate Sine-off  Ansaid Dasin Capsules Magsal Sodium Salicylate  Anturane Depen Capsules Marnal Soma  APF Arthritis pain formula Dewitt's Pills Measurin Stanback  Argesic Dia-Gesic Meclofenamic Sulfinpyrazone  Arthritis Bayer Timed Release Aspirin Diclofenac Meclomen Sulindac   Arthritis pain formula Anacin Dicumarol Medipren Supac  Analgesic (Safety coated) Arthralgen Diffunasal Mefanamic Suprofen  Arthritis Strength Bufferin Dihydrocodeine Mepro Compound Suprol  Arthropan liquid Dopirydamole Methcarbomol with Aspirin Synalgos  ASA tablets/Enseals Disalcid Micrainin Tagament  Ascriptin Doan's Midol Talwin  Ascriptin A/D Dolene Mobidin Tanderil  Ascriptin Extra Strength Dolobid Moblgesic Ticlid  Ascriptin with Codeine Doloprin or Doloprin with Codeine Momentum Tolectin  Asperbuf Duoprin Mono-gesic Trendar  Aspergum Duradyne Motrin or Motrin IB Triminicin  Aspirin plain, buffered or enteric coated Durasal Myochrisine Trigesic  Aspirin Suppositories Easprin Nalfon Trillsate  Aspirin with Codeine Ecotrin Regular or Extra Strength Naprosyn Uracel  Atromid-S Efficin Naproxen Ursinus  Auranofin Capsules Elmiron Neocylate Vanquish  Axotal Emagrin Norgesic Verin  Azathioprine Empirin or Empirin with Codeine Normiflo Vitamin E  Azolid Emprazil Nuprin Voltaren  Bayer Aspirin plain, buffered or children's or timed BC Tablets or powders Encaprin Orgaran Warfarin Sodium  Buff-a-Comp Enoxaparin Orudis Zorpin  Buff-a-Comp with Codeine Equegesic Os-Cal-Gesic   Buffaprin Excedrin plain, buffered or Extra Strength Oxalid   Bufferin Arthritis Strength Feldene Oxphenbutazone   Bufferin plain or Extra Strength Feldene Capsules Oxycodone with Aspirin   Bufferin with Codeine Fenoprofen Fenoprofen Pabalate or Pabalate-SF   Buffets II Flogesic Panagesic   Buffinol plain or Extra Strength Florinal or Florinal with Codeine Panwarfarin   Buf-Tabs Flurbiprofen Penicillamine   Butalbital Compound Four-way cold tablets Penicillin   Butazolidin Fragmin Pepto-Bismol   Carbenicillin Geminisyn Percodan   Carna Arthritis Reliever Geopen Persantine   Carprofen Gold's salt Persistin   Chloramphenicol Goody's Phenylbutazone   Chloromycetin Haltrain Piroxlcam   Clmetidine heparin Plaquenil    Cllnoril Hyco-pap Ponstel   Clofibrate Hydroxy chloroquine Propoxyphen         Before stopping any of these medications, be sure to consult the physician who ordered them.  Some, such as Coumadin (Warfarin) are ordered to prevent or treat serious conditions such as "deep thrombosis", "pumonary embolisms", and other heart problems.  The amount of time that you may need off of the medication may also vary with the medication and the reason for which you were taking it.  If you are taking any of these medications, please make sure you notify your pain physician before you undergo any procedures.

## 2015-09-11 NOTE — Progress Notes (Signed)
   Subjective:    Patient ID: Tanner Baker, male    DOB: 04/01/1951, 65 y.o.   MRN: [id number]  HPI Patient is 64 year old gentleman who returns to pain management and states that his pain is well-controlled at the present time. Patient denies significant pain of the knees lower back lower extremities. We will continue with Celebrex Neurontin hydrocodone acetaminophen and fentanyl patch. The patient does note improvement with the addition of Celebrex. We'll treatment at this time.   Review of Systems     Objective:   Physical Exam  There was minimal tenderness to palpation of the paraspinal muscular region cervical region cervical facet region with minimal tense to palpation of the acromioclavicular and glenohumeral joint regions. Patient was with bilaterally equal grip strength and Tinel and Phalen's maneuver were without increased pain of significant degree. There was tends to palpation of the thoracic facet thoracic paraspinal musculature region with no crepitus of the thoracic region noted. Palpation over the lumbar paraspinal must reason lumbar facet region was attends to palpation of mild degree with lateral bending rotation extension and palpation lumbar facets reproducing minimal discomfort. Straight leg raise was tolerated to 30 without increased pain with dorsiflexion noted. Patient was with mild tenderness to palpation of the left knee with negative anterior and posterior drawer signs with EHL strength questionably decreased with no sensory deficit or dermatomal distribution detected. There was mild tenderness to palpation of the PSIS PII S region as well as the gluteal and piriformis musculature regions. There was negative clonus negative Homans. Abdomen was nontender and no costovertebral tenderness noted.      Assessment & Plan:    Bilateral occipital neuralgia  Migraine headache  Degenerative disc disease lumbar spine  L1-L2 bulging disks, multilevel degenerative changes  throughout the lumbar spine  Lumbar facet syndrome  Sacroiliac joint dysfunction  Degenerative joint disease of knee    PLAN   Continue present medication Neurontin Celebrex hydrocodone acetaminophen and fentanyl patch   F/U PCP Dr. Ouida Sills for evaliation of  BP and general medical  condition  F/U surgical evaluation. May consider pending follow-up evaluations  F/U with dermatologist Dr. Phillip Heal as scheduled  F/U neurological evaluation. May consider pending follow-up evaluations  May consider radiofrequency rhizolysis or intraspinal procedures pending response to present treatment and F/U evaluation   Patient to call [facility] should patient have concerns prior to scheduled return appointment.

## 2015-09-11 NOTE — Progress Notes (Signed)
Safety precautions to be maintained throughout the outpatient stay will include: orient to surroundings, keep bed in low position, maintain call bell within reach at all times, provide assistance with transfer out of bed and ambulation.  

## 2015-09-21 ENCOUNTER — Other Ambulatory Visit: Payer: Self-pay | Admitting: Pain Medicine

## 2015-09-24 DIAGNOSIS — Z0000 Encounter for general adult medical examination without abnormal findings: Secondary | ICD-10-CM | POA: Insufficient documentation

## 2015-10-11 ENCOUNTER — Ambulatory Visit: Payer: Medicare Other | Attending: Pain Medicine | Admitting: Pain Medicine

## 2015-10-11 ENCOUNTER — Encounter: Payer: Self-pay | Admitting: Pain Medicine

## 2015-10-11 VITALS — BP 116/69 | HR 50 | Temp 98.1°F | Resp 14 | Ht 69.0 in | Wt 179.0 lb

## 2015-10-11 DIAGNOSIS — M5481 Occipital neuralgia: Secondary | ICD-10-CM | POA: Insufficient documentation

## 2015-10-11 DIAGNOSIS — M5136 Other intervertebral disc degeneration, lumbar region: Secondary | ICD-10-CM | POA: Insufficient documentation

## 2015-10-11 DIAGNOSIS — M1712 Unilateral primary osteoarthritis, left knee: Secondary | ICD-10-CM

## 2015-10-11 DIAGNOSIS — M5126 Other intervertebral disc displacement, lumbar region: Secondary | ICD-10-CM | POA: Insufficient documentation

## 2015-10-11 DIAGNOSIS — M79606 Pain in leg, unspecified: Secondary | ICD-10-CM | POA: Diagnosis present

## 2015-10-11 DIAGNOSIS — M1710 Unilateral primary osteoarthritis, unspecified knee: Secondary | ICD-10-CM | POA: Diagnosis not present

## 2015-10-11 DIAGNOSIS — M47816 Spondylosis without myelopathy or radiculopathy, lumbar region: Secondary | ICD-10-CM

## 2015-10-11 DIAGNOSIS — G43909 Migraine, unspecified, not intractable, without status migrainosus: Secondary | ICD-10-CM | POA: Insufficient documentation

## 2015-10-11 DIAGNOSIS — M533 Sacrococcygeal disorders, not elsewhere classified: Secondary | ICD-10-CM

## 2015-10-11 DIAGNOSIS — M170 Bilateral primary osteoarthritis of knee: Secondary | ICD-10-CM

## 2015-10-11 DIAGNOSIS — M25562 Pain in left knee: Secondary | ICD-10-CM | POA: Diagnosis present

## 2015-10-11 DIAGNOSIS — M545 Low back pain: Secondary | ICD-10-CM | POA: Diagnosis present

## 2015-10-11 DIAGNOSIS — M5416 Radiculopathy, lumbar region: Secondary | ICD-10-CM

## 2015-10-11 DIAGNOSIS — M51369 Other intervertebral disc degeneration, lumbar region without mention of lumbar back pain or lower extremity pain: Secondary | ICD-10-CM

## 2015-10-11 MED ORDER — FENTANYL 50 MCG/HR TD PT72: MEDICATED_PATCH | TRANSDERMAL | Status: DC

## 2015-10-11 MED ORDER — HYDROCODONE-ACETAMINOPHEN 7.5-325 MG PO TABS: ORAL_TABLET | ORAL | Status: DC

## 2015-10-11 MED ORDER — CELECOXIB 100 MG PO CAPS: ORAL_CAPSULE | ORAL | Status: DC

## 2015-10-11 NOTE — Progress Notes (Signed)
Safety precautions to be maintained throughout the outpatient stay will include: orient to surroundings, keep bed in low position, maintain call bell within reach at all times, provide assistance with transfer out of bed and ambulation.  

## 2015-10-11 NOTE — Progress Notes (Signed)
   Subjective:    Patient ID: Tanner Baker, male    DOB: 08-10-1951, 65 y.o.   MRN: [id number]  HPI  The patient is a 65 year old gentleman who returns to pain management for further evaluation and treatment of pain involving the lower back and lower extremity region and knees especially the left knee. The patient's pain is well-controlled at this time. The patient continues Neurontin  Celebrex hydrocodone acetaminophen and fentanyl patch. The patient is without trauma change in events of daily living the call significant change in symptomatology.. The patient is able to perform most activities of daily living without experiencing any significantly limiting pain. The patient is also able to sleep without pain interfering with patient ability to obtain restful sleep. We will avoid interventional treatment at this time and we'll continue present medications as prescribed. The patient is understanding and agreed to suggested treatment plan       Review of Systems     Objective:   Physical Exam  There was tenderness of the splenius capitis and occipitalis musculature regions palpation which reproduces minimal discomfort. Palpation over the thoracic facet thoracic paraspinal musculature region was attends to palpation of minimal degree. No crepitus of the thoracic region was noted. Palpation over the cervical facet cervical paraspinal must which are regions reproduced pain of minimal degree. The patient was with bilaterally equal grip strength and Tinel and Phalen's maneuver were without increased pain of significant degree. The patient was with unremarkable Spurling's maneuver. Palpation over the lumbar paraspinal muscular region lumbar facet region was attends to palpation of minimal degree. Lateral bending rotation extension and palpation of the lumbar facets reproduced minimal discomfort. Straight leg raise was tolerates approximately 30 without increased pain with dorsiflexion noted. Knees were  without tenderness to palpation of significant degree. Crepitus of the knees were noted. There was no ballottement of the patella and no increased warmth and erythema of the knees noted. Palpation of the PSIS and PII S region reproduced minimal discomfort. There was minimal tenderness of the gluteal and piriformis musculature regions as well as the greater trochanteric region and iliotibial band region. No sensory deficit or dermatomal distribution of the lower extremities noted. There was negative clonus negative Homans and abdomen was nontender with no costovertebral tenderness noted.      Assessment & Plan:      Bilateral occipital neuralgia  Migraine headache  Degenerative disc disease lumbar spine  L1-L2 bulging disks, multilevel degenerative changes throughout the lumbar spine  Lumbar facet syndrome  Sacroiliac joint dysfunction  Degenerative joint disease of knee      PLAN   Continue present medication Neurontin Celebrex hydrocodone acetaminophen and fentanyl patch   F/U PCP Dr. Ouida Sills for evaliation of  BP and general medical  condition  F/U surgical evaluation. May consider pending follow-up evaluations  F/U with dermatologist Dr. Phillip Heal as needed as discussed   F/U neurological evaluation. May consider pending follow-up evaluations  May consider radiofrequency rhizolysis or intraspinal procedures pending response to present treatment and F/U evaluation   Patient to call [facility] should patient have concerns prior to scheduled return appointment.

## 2015-10-11 NOTE — Patient Instructions (Signed)
PLAN   Continue present medication Neurontin Celebrex hydrocodone acetaminophen and fentanyl patch   F/U PCP Dr. Ouida Sills for evaliation of  BP and general medical  condition  F/U surgical evaluation. May consider pending follow-up evaluations  F/U with dermatologist Dr. Phillip Heal as discussed  F/U neurological evaluation. May consider pending follow-up evaluations  May consider radiofrequency rhizolysis or intraspinal procedures pending response to present treatment and F/U evaluation   Patient to call [facility] should patient have concerns prior to scheduled return appointment.

## 2015-11-07 ENCOUNTER — Ambulatory Visit: Payer: Medicare Other | Attending: Pain Medicine | Admitting: Pain Medicine

## 2015-11-07 ENCOUNTER — Encounter: Payer: Self-pay | Admitting: Pain Medicine

## 2015-11-07 VITALS — BP 110/87 | HR 52 | Temp 97.9°F | Resp 16 | Ht 69.0 in | Wt 178.0 lb

## 2015-11-07 DIAGNOSIS — M5416 Radiculopathy, lumbar region: Secondary | ICD-10-CM

## 2015-11-07 DIAGNOSIS — M533 Sacrococcygeal disorders, not elsewhere classified: Secondary | ICD-10-CM | POA: Diagnosis not present

## 2015-11-07 DIAGNOSIS — M5126 Other intervertebral disc displacement, lumbar region: Secondary | ICD-10-CM | POA: Insufficient documentation

## 2015-11-07 DIAGNOSIS — M79605 Pain in left leg: Secondary | ICD-10-CM | POA: Diagnosis present

## 2015-11-07 DIAGNOSIS — M545 Low back pain: Secondary | ICD-10-CM | POA: Diagnosis present

## 2015-11-07 DIAGNOSIS — M5481 Occipital neuralgia: Secondary | ICD-10-CM | POA: Insufficient documentation

## 2015-11-07 DIAGNOSIS — M1710 Unilateral primary osteoarthritis, unspecified knee: Secondary | ICD-10-CM | POA: Insufficient documentation

## 2015-11-07 DIAGNOSIS — M5136 Other intervertebral disc degeneration, lumbar region: Secondary | ICD-10-CM | POA: Diagnosis not present

## 2015-11-07 DIAGNOSIS — M79604 Pain in right leg: Secondary | ICD-10-CM | POA: Diagnosis present

## 2015-11-07 DIAGNOSIS — M1712 Unilateral primary osteoarthritis, left knee: Secondary | ICD-10-CM

## 2015-11-07 DIAGNOSIS — M47816 Spondylosis without myelopathy or radiculopathy, lumbar region: Secondary | ICD-10-CM

## 2015-11-07 DIAGNOSIS — G43909 Migraine, unspecified, not intractable, without status migrainosus: Secondary | ICD-10-CM | POA: Diagnosis not present

## 2015-11-07 DIAGNOSIS — M170 Bilateral primary osteoarthritis of knee: Secondary | ICD-10-CM

## 2015-11-07 MED ORDER — CELECOXIB 100 MG PO CAPS: ORAL_CAPSULE | ORAL | Status: DC

## 2015-11-07 MED ORDER — GABAPENTIN 100 MG PO CAPS: ORAL_CAPSULE | ORAL | Status: DC

## 2015-11-07 MED ORDER — FENTANYL 50 MCG/HR TD PT72: MEDICATED_PATCH | TRANSDERMAL | Status: DC

## 2015-11-07 MED ORDER — HYDROCODONE-ACETAMINOPHEN 7.5-325 MG PO TABS: ORAL_TABLET | ORAL | Status: DC

## 2015-11-07 NOTE — Progress Notes (Signed)
   Subjective:    Patient ID: Tanner Baker, male    DOB: 1950-09-25, 65 y.o.   MRN: [id number]  HPI  The patient is a 65 year old gentleman who returns to pain management for further evaluation and treatment of pain involving the lower back and lower extremity regions. The patient states that his pain is well-controlled at this time. The patient denies any significant pain of the lower back or pain of the knee. The patient states that he is tolerating medications well including Celebrex. We will continue Celebrex Neurontin hydrocodone acetaminophen and fentanyl patch as prescribed. The patient was with understanding and agreed to suggested treatment plan. We will avoid interventional treatment. Patient is doing well with the present treatment regimen      Review of Systems     Objective:   Physical Exam  There was tenderness of the splenius capitis and occipitalis musculature regions. Palpation of these regions reproduced pain of mild degree with mild tenderness over the cervical facet cervical paraspinal musculature region. There was mild tenderness of the thoracic facet thoracic paraspinal musculature region. The patient was with minimal tenderness of the acromioclavicular and glenohumeral joint region and was with unremarkable Spurling's maneuver. The patient was with bilaterally equal grip strength and Tinel and Phalen's maneuver reproducing minimal discomfort. Palpation of the lumbar region reproduced minimal discomfort with lateral bending rotation extension and palpation of the lumbar facets reproducing minimal discomfort. There was minimal tenderness of the PSIS and PII S region as well as the gluteal and piriformis musculature regions. Straight leg raising was tolerated to 30 without increased pain with dorsiflexion noted. There was negative clonus negative Homans. DTRs appeared to be trace at the knees. The knee was with crepitus of the knee with negative anterior and posterior drawer  signs. Sensory deficit or dermatomal distribution detected. Negative clonus negative Homans. Abdomen nontender with no costovertebral tenderness      Assessment & Plan:     Bilateral occipital neuralgia  Migraine headache  Degenerative disc disease lumbar spine  L1-L2 bulging disks, multilevel degenerative changes throughout the lumbar spine  Lumbar facet syndrome  Sacroiliac joint dysfunction  Degenerative joint disease of knee    PLAN   Continue present medication Neurontin Celebrex hydrocodone acetaminophen and fentanyl patch   F/U PCP Dr. Ouida Sills for evaliation of  BP and general medical  condition Continue buspirone and propranolol as prescribed by Dr. Ouida Sills  F/U surgical evaluation. May consider pending follow-up evaluations  F/U with dermatologist Dr. Phillip Heal as discussed  F/U neurological evaluation. May consider pending follow-up evaluations  May consider radiofrequency rhizolysis or intraspinal procedures pending response to present treatment and F/U evaluation   Patient to call [facility] should patient have concerns prior to scheduled return appointment.

## 2015-11-07 NOTE — Progress Notes (Signed)
Safety precautions to be maintained throughout the outpatient stay will include: orient to surroundings, keep bed in low position, maintain call bell within reach at all times, provide assistance with transfer out of bed and ambulation.   Patient here for medication refills.  Fentanyl patch, Norco and Celebrex.

## 2015-11-07 NOTE — Patient Instructions (Addendum)
PLAN   Continue present medication Neurontin Celebrex hydrocodone acetaminophen and fentanyl patch   F/U PCP Dr. Ouida Sills for evaliation of  BP and general medical  condition Continue buspirone and propranolol as prescribed by Dr. Ouida Sills  F/U surgical evaluation. May consider pending follow-up evaluations  F/U with dermatologist Dr. Phillip Heal as discussed  F/U neurological evaluation. May consider pending follow-up evaluations  May consider radiofrequency rhizolysis or intraspinal procedures pending response to present treatment and F/U evaluation   Patient to call [facility] should patient have concerns prior to scheduled return appointment.

## 2015-11-08 ENCOUNTER — Encounter: Payer: Medicare Other | Admitting: Pain Medicine

## 2015-11-17 ENCOUNTER — Encounter: Payer: Self-pay | Admitting: Emergency Medicine

## 2015-11-17 ENCOUNTER — Emergency Department: Payer: Medicare Other

## 2015-11-17 ENCOUNTER — Emergency Department
Admission: EM | Admit: 2015-11-17 | Discharge: 2015-11-17 | Disposition: A | Discharge status: Home / Self Care | Payer: Medicare Other | Attending: Emergency Medicine | Admitting: Emergency Medicine

## 2015-11-17 DIAGNOSIS — I10 Essential (primary) hypertension: Secondary | ICD-10-CM | POA: Diagnosis not present

## 2015-11-17 DIAGNOSIS — G8929 Other chronic pain: Secondary | ICD-10-CM | POA: Insufficient documentation

## 2015-11-17 DIAGNOSIS — N390 Urinary tract infection, site not specified: Secondary | ICD-10-CM | POA: Insufficient documentation

## 2015-11-17 DIAGNOSIS — R911 Solitary pulmonary nodule: Secondary | ICD-10-CM | POA: Diagnosis not present

## 2015-11-17 DIAGNOSIS — N200 Calculus of kidney: Secondary | ICD-10-CM | POA: Diagnosis not present

## 2015-11-17 DIAGNOSIS — Z79899 Other long term (current) drug therapy: Secondary | ICD-10-CM | POA: Diagnosis not present

## 2015-11-17 DIAGNOSIS — R109 Unspecified abdominal pain: Secondary | ICD-10-CM | POA: Diagnosis present

## 2015-11-17 HISTORY — DX: Calculus of kidney: N20.0

## 2015-11-17 LAB — HEPATIC FUNCTION PANEL
ALBUMIN: 4.2 g/dL (ref 3.5–5.0)
ALK PHOS: 37 U/L — AB (ref 38–126)
ALT: 17 U/L (ref 17–63)
AST: 24 U/L (ref 15–41)
Bilirubin, Direct: 0.2 mg/dL (ref 0.1–0.5)
Indirect Bilirubin: 1.6 mg/dL — ABNORMAL HIGH (ref 0.3–0.9)
TOTAL PROTEIN: 8.1 g/dL (ref 6.5–8.1)
Total Bilirubin: 1.8 mg/dL — ABNORMAL HIGH (ref 0.3–1.2)

## 2015-11-17 LAB — CBC
HEMATOCRIT: 41.7 % (ref 40.0–52.0)
Hemoglobin: 14.6 g/dL (ref 13.0–18.0)
MCH: 31.7 pg (ref 26.0–34.0)
MCHC: 35 g/dL (ref 32.0–36.0)
MCV: 90.6 fL (ref 80.0–100.0)
PLATELETS: 235 10*3/uL (ref 150–440)
RBC: 4.61 MIL/uL (ref 4.40–5.90)
RDW: 12.5 % (ref 11.5–14.5)
WBC: 7.8 10*3/uL (ref 3.8–10.6)

## 2015-11-17 LAB — URINALYSIS COMPLETE WITH MICROSCOPIC (ARMC ONLY)
BILIRUBIN URINE: NEGATIVE
Bacteria, UA: NONE SEEN
Glucose, UA: NEGATIVE mg/dL
HGB URINE DIPSTICK: NEGATIVE
LEUKOCYTES UA: NEGATIVE
Nitrite: POSITIVE — AB
PH: 7 (ref 5.0–8.0)
Protein, ur: 30 mg/dL — AB
SQUAMOUS EPITHELIAL / LPF: NONE SEEN
Specific Gravity, Urine: 1.019 (ref 1.005–1.030)

## 2015-11-17 LAB — BASIC METABOLIC PANEL
ANION GAP: 10 (ref 5–15)
BUN: 21 mg/dL — ABNORMAL HIGH (ref 6–20)
CO2: 28 mmol/L (ref 22–32)
Calcium: 9.6 mg/dL (ref 8.9–10.3)
Chloride: 100 mmol/L — ABNORMAL LOW (ref 101–111)
Creatinine, Ser: 0.96 mg/dL (ref 0.61–1.24)
GFR calc Af Amer: >60 mL/min (ref >60)
GLUCOSE: 123 mg/dL — AB (ref 65–99)
POTASSIUM: 2.9 mmol/L — AB (ref 3.5–5.1)
Sodium: 138 mmol/L (ref 135–145)

## 2015-11-17 LAB — LIPASE, BLOOD: Lipase: 16 U/L (ref 11–51)

## 2015-11-17 MED ORDER — ONDANSETRON HCL 4 MG/2ML IJ SOLN
4.0000 mg | Freq: Once | INTRAMUSCULAR | Status: AC
  Administered 2015-11-17: 4 mg via INTRAVENOUS
  Filled 2015-11-17: qty 2

## 2015-11-17 MED ORDER — SODIUM CHLORIDE 0.9 % IV BOLUS (SEPSIS)
1000.0000 mL | Freq: Once | INTRAVENOUS | Status: AC
  Administered 2015-11-17: 1000 mL via INTRAVENOUS
  Filled 2015-11-17: qty 1000

## 2015-11-17 MED ORDER — IBUPROFEN 600 MG PO TABS
600.0000 mg | ORAL_TABLET | Freq: Once | ORAL | Status: AC
  Administered 2015-11-17: 600 mg via ORAL
  Filled 2015-11-17: qty 1

## 2015-11-17 MED ORDER — TAMSULOSIN HCL 0.4 MG PO CAPS: 0.4000 mg | ORAL_CAPSULE | Freq: Every day | ORAL | Status: DC

## 2015-11-17 MED ORDER — FENTANYL 50 MCG/HR TD PT72
50.0000 ug | MEDICATED_PATCH | TRANSDERMAL | Status: DC
  Administered 2015-11-17: 50 ug via TRANSDERMAL

## 2015-11-17 MED ORDER — HYDROMORPHONE HCL 1 MG/ML IJ SOLN
1.0000 mg | Freq: Once | INTRAMUSCULAR | Status: AC
  Administered 2015-11-17: 1 mg via INTRAVENOUS
  Filled 2015-11-17: qty 1

## 2015-11-17 MED ORDER — ONDANSETRON HCL 4 MG/2ML IJ SOLN
INTRAMUSCULAR | Status: AC
  Administered 2015-11-17: 4 mg via INTRAVENOUS
  Filled 2015-11-17: qty 2

## 2015-11-17 MED ORDER — MORPHINE SULFATE (PF) 2 MG/ML IV SOLN
INTRAVENOUS | Status: AC
  Filled 2015-11-17: qty 2

## 2015-11-17 MED ORDER — CEFTRIAXONE SODIUM 1 G IJ SOLR
1.0000 g | Freq: Once | INTRAMUSCULAR | Status: AC
  Administered 2015-11-17: 1 g via INTRAVENOUS
  Filled 2015-11-17: qty 10

## 2015-11-17 MED ORDER — ONDANSETRON HCL 4 MG PO TABS: 4.0000 mg | ORAL_TABLET | Freq: Three times a day (TID) | ORAL | Status: DC | PRN

## 2015-11-17 MED ORDER — KETOROLAC TROMETHAMINE 10 MG PO TABS: 10.0000 mg | ORAL_TABLET | Freq: Three times a day (TID) | ORAL | Status: DC | PRN

## 2015-11-17 MED ORDER — MORPHINE SULFATE (PF) 4 MG/ML IV SOLN
4.0000 mg | Freq: Once | INTRAVENOUS | Status: AC
Start: 2015-11-17 — End: 2015-11-17
  Administered 2015-11-17: 4 mg via INTRAVENOUS

## 2015-11-17 MED ORDER — CEPHALEXIN 500 MG PO CAPS: 500.0000 mg | ORAL_CAPSULE | Freq: Two times a day (BID) | ORAL | Status: DC

## 2015-11-17 MED ORDER — POTASSIUM CHLORIDE CRYS ER 20 MEQ PO TBCR
40.0000 meq | EXTENDED_RELEASE_TABLET | Freq: Once | ORAL | Status: AC
Start: 2015-11-17 — End: 2015-11-17
  Administered 2015-11-17: 40 meq via ORAL
  Filled 2015-11-17: qty 2

## 2015-11-17 MED ORDER — HYDROMORPHONE HCL 1 MG/ML IJ SOLN: 1.0000 mg | Freq: Once | INTRAMUSCULAR | Status: DC

## 2015-11-17 MED ORDER — ONDANSETRON HCL 4 MG/2ML IJ SOLN
4.0000 mg | Freq: Once | INTRAMUSCULAR | Status: AC | PRN
  Administered 2015-11-17: 4 mg via INTRAVENOUS

## 2015-11-17 MED ORDER — TAMSULOSIN HCL 0.4 MG PO CAPS
0.4000 mg | ORAL_CAPSULE | Freq: Every day | ORAL | Status: DC
  Administered 2015-11-17: 0.4 mg via ORAL
  Filled 2015-11-17: qty 1

## 2015-11-17 NOTE — ED Provider Notes (Signed)
[facility] Emergency Department Provider Note   ____________________________________________  Time seen: Approximately 8 AM I have reviewed the triage vital signs and the triage nursing note.  HISTORY  Chief Complaint Flank Pain   Historian Patient  HPI Tanner Baker is a 65 y.o. male with a history of chronic low back pain due to degenerative disc disease, as well as history of kidney stones and urinary tract infections, is here for bilateral flank pain with nausea. He's had dark urinates frequently urinating. He is concerned his kidney stone or urinary tract infection. He states this is not similar to her chronic low back pain. No fever. No black or bloody emesis. No diarrhea. Symptoms are moderate to severe this point time. No exacerbating or alleviating symptoms.    Past Medical History  Diagnosis Date  . Hypertension   . Cancer ([place])     skin cancer  . Kidney stone     Patient Active Problem List   Diagnosis Date Noted  . 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 DDD (degenerative disc disease), lumbar 01/24/2015  . Facet syndrome, lumbar 01/24/2015  . Sacroiliac joint dysfunction 01/24/2015  . Lumbar radiculopathy, chronic 01/24/2015  . DJD (degenerative joint disease) of knee 01/24/2015    Past Surgical History  Procedure Laterality Date  . Wisdom tooth extraction    . Tonsillectomy    . Arm surg Right   . Cosmetic surgery    . Skin cancer excision      Current Outpatient Rx  Name  Route  Sig  Dispense  Refill  . b complex vitamins tablet   Oral   Take 1 tablet by mouth 2 (two) times daily.         . busPIRone (BUSPAR) 10 MG tablet   Oral   Take 10 mg by mouth 3 (three) times daily. Reported on 10/11/2015         . cefUROXime (CEFTIN) 250 MG tablet   Oral   Take 1 tablet (250 mg total) by mouth 2 (two) times daily with a meal.   14 tablet   0   . celecoxib (CELEBREX) 100 MG  capsule      Limit 1 tablet by mouth per day or twice per day if tolerated   50 capsule   0   . cephALEXin (KEFLEX) 500 MG capsule   Oral   Take 1 capsule (500 mg total) by mouth 2 (two) times daily.   14 capsule   0   . Cholecalciferol (VITAMIN D-3 PO)   Oral   Take 1,000 Int'l Units/day by mouth every morning.         . Cyanocobalamin (VITAMIN B 12 PO)   Oral   Take 250 mg by mouth every morning.         . fentaNYL (DURAGESIC - DOSED MCG/HR) 50 MCG/HR      Applied 1 patch to skin every 3 days if tolerated   10 patch   0   . ferrous sulfate 325 (65 FE) MG tablet   Oral   Take 325 mg by mouth daily with breakfast.         . gabapentin (NEURONTIN) 100 MG capsule      Limit 1-2 taabs bid to tid if tolerated   180 capsule   2   . HYDROcodone-acetaminophen (NORCO) 7.5-325 MG tablet      Limit 1 tablet daily, bid to tid for breakthrough pain if needed and  if tolerated while wearing fentanyl patch   80 tablet   0   . levETIRAcetam (KEPPRA) 500 MG tablet   Oral   Take 500 mg by mouth 4 (four) times daily.         Marland Kitchen omeprazole (PRILOSEC) 20 MG capsule   Oral   Take 20 mg by mouth 2 (two) times daily before a meal.         . ondansetron (ZOFRAN) 4 MG tablet   Oral   Take 1 tablet (4 mg total) by mouth every 8 (eight) hours as needed for nausea or vomiting.   10 tablet   0   . PARoxetine (PAXIL) 20 MG tablet   Oral   Take 20 mg by mouth daily.         . propranolol (INDERAL) 20 MG tablet   Oral   Take 60 mg by mouth 2 (two) times daily.          . SUMAtriptan (IMITREX) 100 MG tablet   Oral   Take 100 mg by mouth once. May repeat in 2 hours if headache persists or recurs.         . tamsulosin (FLOMAX) 0.4 MG CAPS capsule   Oral   Take 1 capsule (0.4 mg total) by mouth daily.   7 capsule   0   . traZODone (DESYREL) 50 MG tablet   Oral   Take 100 mg by mouth at bedtime as needed for sleep. Take 1-2 tablets at bedtime as needed            Allergies No known allergies  Family History  Problem Relation Age of Onset  . Cancer Mother   . Diabetes Father   . Heart disease Father   . Cancer Father     Social History Social History  Substance Use Topics  . Smoking status: Never Smoker   . Smokeless tobacco: Never Used  . Alcohol Use: No    Review of Systems  Constitutional: Negative for fever. Eyes: Negative for visual changes. ENT: Negative for sore throat. Cardiovascular: Negative for chest pain. Respiratory: Negative for shortness of breath. Gastrointestinal: Negative for  diarrhea. Genitourinary: Negative for dysuria.Positive for frequency of urination. Musculoskeletal: Denies muscular pain.. Skin: Negative for rash. Neurological: Negative for headache. 10 point Review of Systems otherwise negative ____________________________________________   PHYSICAL EXAM:  VITAL SIGNS: ED Triage Vitals  Enc Vitals Group     BP 11/17/15 0718 151/73 mmHg     Pulse Rate 11/17/15 0718 70     Resp 11/17/15 0718 26     Temp 11/17/15 0718 97.5 F (36.4 C)     Temp Source 11/17/15 0718 Oral     SpO2 11/17/15 0718 100 %     Weight 11/17/15 0718 180 lb (81.647 kg)     Height 11/17/15 0718 5\' 9"  (1.753 m)     Head Cir --      Peak Flow --      Pain Score 11/17/15 0718 10     Pain Loc --      Pain Edu? --      Excl. in [place]? --      Constitutional: Alert and oriented. Well appearing Overall, but in distress due to pain. HEENT   Head: Normocephalic and atraumatic.      Eyes: Conjunctivae are normal. PERRL. Normal extraocular movements.      Ears:         Nose: No congestion/rhinnorhea.   Mouth/Throat: Mucous membranes are  moist.   Neck: No stridor. Cardiovascular/Chest: Normal rate, regular rhythm.  No murmurs, rubs, or gallops. Respiratory: Normal respiratory effort without tachypnea nor retractions. Breath sounds are clear and equal bilaterally. No wheezes/rales/rhonchi. Gastrointestinal: Soft. No  distention, no guarding, no rebound. Nontender.   Genitourinary/rectal:Deferred Musculoskeletal: Moderate CVA tenderness bilaterally. Nontender with normal range of motion in all extremities. No joint effusions.  No lower extremity tenderness.  No edema. Neurologic:  Normal speech and language. No gross or focal neurologic deficits are appreciated. Skin:  Skin is warm, dry and intact. No rash noted. Psychiatric: Mood and affect are normal. Speech and behavior are normal. Patient exhibits appropriate insight and judgment.  ____________________________________________   EKG I, Lisa Roca, MD, the attending physician have personally viewed and interpreted all ECGs.  64 bpm. Normal sinus rhythm. Narrow QRS. Normal axis. Normal ST and T-wave ____________________________________________  LABS (pertinent positives/negatives)  Basic metabolic panel significant for potassium is 2.9 and otherwise without significant abnormalities CBC within normal limits Urinalysis 6-30 white blood cells, nitrite positive, 1+ ketones, 0-5 red blood cells  ____________________________________________  RADIOLOGY All Xrays were viewed by me. Imaging interpreted by Radiologist.  Renal ultrasound:  IMPRESSION: 1. Right renal cyst. No other right renal abnormalities. 2. 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 4 mm nonobstructive stone on the left. Rounded hypoechoic regions in the left kidney are incompletely evaluated but may be cysts. CT imaging could better evaluate if concern persists.  CT abdomen and pelvis without contrast:   IMPRESSION: 1. Multiple small nodules at the lung bases, as described above. No follow-up needed if patient is low-risk (and has no known or suspected primary neoplasm). Non-contrast chest CT can be considered in 12 months if patient is high-risk. This recommendation follows the consensus statement: Guidelines for Management of  Incidental Pulmonary Nodules Detected on CT Images:From the Fleischner Society 2017; published online before print (10.1148/radiol.SG:5268862). 2. Mild left hydronephrosis and hydroureter to the level of the ureterovesical junction. There is a 4 mm rounded calcification in that location. It is difficult to determine if this is in the ureter or an adjacent phlebolith. 3. 3 mm nonobstructing right renal calculus. 4. Small left inguinal hernia containing an incompletely descended testis inferiorly. __________________________________________  PROCEDURES  Procedure(s) performed: None  Critical Care performed: None  ____________________________________________   ED COURSE / ASSESSMENT AND PLAN  Pertinent labs & imaging results that were available during my care of the patient were reviewed by me and considered in my medical decision making (see chart for details).   Patient writhing in severe pain complaining of bilateral flanks, stating this feels like prior kidney stones.  No fever.  Laboratory studies are reassuring. No kidney failure. He does have mild hypokalemia, and a history of hypokalemia, he takes potassium supplementation due to the fact that he is on hydrocortisone.  Ultrasound was obtained first to image the kidneys, and there is some question about possible left-sided duplicate system, and stones within the kidneys without evidence of ureterolithiasis.  In discussion with the family, he's never had abdominal CT imaging, and we discussed going ahead and obtaining a CT scan for further evaluation.  On his CT scan he has incidental pulmonary nodules that he will follow-up with his primary care physician for this. He does have left hydronephrosis and hydroureter at the level of the UVJ and a likely 4 mm kidney stone. Next and he will be treated symptomatically for kidney stone as well as urinary tract infection.  I discussed with  on-call urologist, Dr. Junious Silk, recommends  okay for outpatient management, may make follow-up appointment.  Patient sentinel patch was removed at triage, it was replaced here in the emergency department. He has Norco at home. I will also add prescription for Toradol.  CONSULTATIONS:   Phone discussion with Dr. Junious Silk, urology.   Patient / Family / Caregiver informed of clinical course, medical decision-making process, and agree with plan.   I discussed return precautions, follow-up instructions, and discharged instructions with patient and/or family.   ___________________________________________   FINAL CLINICAL IMPRESSION(S) / ED DIAGNOSES   Final diagnoses:  UTI (lower urinary tract infection)  Kidney stone  Pulmonary nodule              Note: This dictation was prepared with Dragon dictation. Any transcriptional errors that result from this process are unintentional   Lisa Roca, MD 11/17/15 1350

## 2015-11-17 NOTE — ED Notes (Signed)
Pt c/o severe flank pain since Sunday; bilateral. Has had nausea and vomiting.  Has blood in urine.  Has hx kidney stones. Denies fevers.

## 2015-11-17 NOTE — ED Notes (Signed)
Did not give fentanyl per pain protocol bc pt already has fentanyl patch on.

## 2015-11-17 NOTE — ED Notes (Signed)
Fentanyl patch removed per dr Reita Cliche

## 2015-11-17 NOTE — ED Notes (Signed)
Pt was unsteady on feet

## 2015-11-17 NOTE — Discharge Instructions (Signed)
You were evaluated for flank pain and her being treated for urinary tract infection as well as kidney stones. Neck silent return to the emergency department for any worsening symptoms including uncontrolled pain, vomiting and cannot keep medications down, fever, passing out, or any other symptoms concerning to you.  Several lung nodules were found on CT imaging incidentally, and you should follow up with your primary care physician about any further future imaging for follow-up.    Kidney Stones Kidney stones (urolithiasis) are deposits that form inside your kidneys. The intense pain is caused by the stone moving through the urinary tract. When the stone moves, the ureter goes into spasm around the stone. The stone is usually passed in the urine.  CAUSES   A disorder that makes certain neck glands produce too much parathyroid hormone (primary hyperparathyroidism).  A buildup of uric acid crystals, similar to gout in your joints.  Narrowing (stricture) of the ureter.  A kidney obstruction present at birth (congenital obstruction).  Previous surgery on the kidney or ureters.  Numerous kidney infections. SYMPTOMS   Feeling sick to your stomach (nauseous).  Throwing up (vomiting).  Blood in the urine (hematuria).  Pain that usually spreads (radiates) to the groin.  Frequency or urgency of urination. DIAGNOSIS   Taking a history and physical exam.  Blood or urine tests.  CT scan.  Occasionally, an examination of the inside of the urinary bladder (cystoscopy) is performed. TREATMENT   Observation.  Increasing your fluid intake.  Extracorporeal shock wave lithotripsy--This is a noninvasive procedure that uses shock waves to break up kidney stones.  Surgery may be needed if you have severe pain or persistent obstruction. There are various surgical procedures. Most of the procedures are performed with the use of small instruments. Only small incisions are needed to  accommodate these instruments, so recovery time is minimized. The size, location, and chemical composition are all important variables that will determine the proper choice of action for you. Talk to your health care provider to better understand your situation so that you will minimize the risk of injury to yourself and your kidney.  HOME CARE INSTRUCTIONS   Drink enough water and fluids to keep your urine clear or pale yellow. This will help you to pass the stone or stone fragments.  Strain all urine through the provided strainer. Keep all particulate matter and stones for your health care provider to see. The stone causing the pain may be as small as a grain of salt. It is very important to use the strainer each and every time you pass your urine. The collection of your stone will allow your health care provider to analyze it and verify that a stone has actually passed. The stone analysis will often identify what you can do to reduce the incidence of recurrences.  Only take over-the-counter or prescription medicines for pain, discomfort, or fever as directed by your health care provider.  Keep all follow-up visits as told by your health care provider. This is important.  Get follow-up X-rays if required. The absence of pain does not always mean that the stone has passed. It may have only stopped moving. If the urine remains completely obstructed, it can cause loss of kidney function or even complete destruction of the kidney. It is your responsibility to make sure X-rays and follow-ups are completed. Ultrasounds of the kidney can show blockages and the status of the kidney. Ultrasounds are not associated with any radiation and can be performed easily in  a matter of minutes.  Make changes to your daily diet as told by your health care provider. You may be told to:  Limit the amount of salt that you eat.  Eat 5 or more servings of fruits and vegetables each day.  Limit the amount of meat,  poultry, fish, and eggs that you eat.  Collect a 24-hour urine sample as told by your health care provider.You may need to collect another urine sample every 6-12 months. SEEK MEDICAL CARE IF:  You experience pain that is progressive and unresponsive to any pain medicine you have been prescribed. SEEK IMMEDIATE MEDICAL CARE IF:   Pain cannot be controlled with the prescribed medicine.  You have a fever or shaking chills.  The severity or intensity of pain increases over 18 hours and is not relieved by pain medicine.  You develop a new onset of abdominal pain.  You feel faint or pass out.  You are unable to urinate.   This information is not intended to replace advice given to you by your health care provider. Make sure you discuss any questions you have with your health care provider.   Document Released: 08/18/2005 Document Revised: 05/09/2015 Document Reviewed: 01/19/2013 Elsevier Interactive Patient Education 2016 [employer]  Urinary Tract Infection A urinary tract infection (UTI) can occur any place along the urinary tract. The tract includes the kidneys, ureters, bladder, and urethra. A type of germ called bacteria often causes a UTI. UTIs are often helped with antibiotic medicine.  HOME CARE   If given, take antibiotics as told by your doctor. Finish them even if you start to feel better.  Drink enough fluids to keep your pee (urine) clear or pale yellow.  Avoid tea, drinks with caffeine, and bubbly (carbonated) drinks.  Pee often. Avoid holding your pee in for a long time.  Pee before and after having sex (intercourse).  Wipe from front to back after you poop (bowel movement) if you are a woman. Use each tissue only once. GET HELP RIGHT AWAY IF:   You have back pain.  You have lower belly (abdominal) pain.  You have chills.  You feel sick to your stomach (nauseous).  You throw up (vomit).  Your burning or discomfort with peeing does not go away.  You  have a fever.  Your symptoms are not better in 3 days. MAKE SURE YOU:   Understand these instructions.  Will watch your condition.  Will get help right away if you are not doing well or get worse.   This information is not intended to replace advice given to you by your health care provider. Make sure you discuss any questions you have with your health care provider.   Document Released: 02/04/2008 Document Revised: 09/08/2014 Document Reviewed: 03/18/2012 Elsevier Interactive Patient Education [employer].

## 2015-11-17 NOTE — ED Notes (Signed)
Report given to Emma, RN

## 2015-11-21 LAB — URINE CULTURE: CULTURE: NO GROWTH

## 2015-12-03 ENCOUNTER — Other Ambulatory Visit: Payer: Self-pay | Admitting: Pain Medicine

## 2015-12-03 ENCOUNTER — Encounter: Payer: Self-pay | Admitting: Pain Medicine

## 2015-12-03 ENCOUNTER — Ambulatory Visit: Payer: Medicare Other | Attending: Pain Medicine | Admitting: Pain Medicine

## 2015-12-03 VITALS — BP 106/56 | HR 50 | Temp 97.9°F | Resp 16 | Ht 69.0 in | Wt 178.0 lb

## 2015-12-03 DIAGNOSIS — M5416 Radiculopathy, lumbar region: Secondary | ICD-10-CM

## 2015-12-03 DIAGNOSIS — M533 Sacrococcygeal disorders, not elsewhere classified: Secondary | ICD-10-CM | POA: Diagnosis not present

## 2015-12-03 DIAGNOSIS — M170 Bilateral primary osteoarthritis of knee: Secondary | ICD-10-CM

## 2015-12-03 DIAGNOSIS — M1712 Unilateral primary osteoarthritis, left knee: Secondary | ICD-10-CM

## 2015-12-03 DIAGNOSIS — M5126 Other intervertebral disc displacement, lumbar region: Secondary | ICD-10-CM | POA: Diagnosis not present

## 2015-12-03 DIAGNOSIS — M25561 Pain in right knee: Secondary | ICD-10-CM | POA: Diagnosis present

## 2015-12-03 DIAGNOSIS — M5136 Other intervertebral disc degeneration, lumbar region: Secondary | ICD-10-CM | POA: Diagnosis not present

## 2015-12-03 DIAGNOSIS — M5481 Occipital neuralgia: Secondary | ICD-10-CM | POA: Insufficient documentation

## 2015-12-03 DIAGNOSIS — G43909 Migraine, unspecified, not intractable, without status migrainosus: Secondary | ICD-10-CM | POA: Diagnosis not present

## 2015-12-03 DIAGNOSIS — M179 Osteoarthritis of knee, unspecified: Secondary | ICD-10-CM | POA: Insufficient documentation

## 2015-12-03 DIAGNOSIS — M25562 Pain in left knee: Secondary | ICD-10-CM | POA: Diagnosis present

## 2015-12-03 DIAGNOSIS — M545 Low back pain: Secondary | ICD-10-CM | POA: Diagnosis present

## 2015-12-03 DIAGNOSIS — M47816 Spondylosis without myelopathy or radiculopathy, lumbar region: Secondary | ICD-10-CM

## 2015-12-03 MED ORDER — CELECOXIB 100 MG PO CAPS: ORAL_CAPSULE | ORAL | Status: DC

## 2015-12-03 MED ORDER — GABAPENTIN 100 MG PO CAPS: ORAL_CAPSULE | ORAL | Status: DC

## 2015-12-03 MED ORDER — FENTANYL 50 MCG/HR TD PT72: MEDICATED_PATCH | TRANSDERMAL | Status: DC

## 2015-12-03 MED ORDER — HYDROCODONE-ACETAMINOPHEN 7.5-325 MG PO TABS: ORAL_TABLET | ORAL | Status: DC

## 2015-12-03 NOTE — Progress Notes (Signed)
   Subjective:    Patient ID: Tanner Baker, male    DOB: 04-12-1951, 65 y.o.   MRN: [id number]  HPI  The patient is a 65 year old gentleman who returns to pain management for further evaluation and treatment of pain involving the lower back lower extremity region and region of the knees. At the present time patient's pain is well controlled present treatment regimen. We will avoid interventional treatment and continue medications consisting of Neurontin and Celebrex hydrocodone acetaminophen and fentanyl patch. The patient is in agreement with suggested treatment plan. The patient is able to perform activities of daily living and obtain restful sleep without any pain interfering with his activities or sleep     Review of Systems     Objective:   Physical Exam   There was tenderness of the splenius capitis and occipitalis musculature region a mild degree there was mild tenderness over the cervical facet cervical paraspinal musculature region. Palpation of the acromioclavicular and glenohumeral joint regions reproduced pain of mild degree and palpation over the thoracic facet thoracic paraspinal musculature region was with minimal tenderness to palpation with no crepitus of the thoracic region noted. It appeared to be with bilaterally equal grip strength and Tinel and Phalen's maneuver were without increased pain of significant degree. The patient was with unremarkable Spurling's maneuver. Palpation over the lumbar paraspinal musculatures and lumbar facet region was attends to palpation of mild degree with mild tenderness of the PSIS and PII S region. Straight leg raising was tolerated to 30 without increased pain with dorsiflexion noted. Knees were with crepitus of the knees with negative anterior and posterior drawer signs without ballottement of the patella. There was minimal tenderness of the greater trochanteric region iliotibial band region. No sensory deficit or dermatomal distribution  detected. Negative clonus negative Homans. Abdomen nontender with no costovertebral tenderness noted.     Assessment & Plan:      Bilateral occipital neuralgia  Migraine headache  Degenerative disc disease lumbar spine  L1-L2 bulging disks, multilevel degenerative changes throughout the lumbar spine  Lumbar facet syndrome  Sacroiliac joint dysfunction  Degenerative joint disease of knee    PLAN   Continue present medication Neurontin Celebrex hydrocodone acetaminophen and fentanyl patch   F/U PCP Dr. Ouida Sills for evaliation of  BP kidney stones  (nephrolithiasis )and general medical  condition  F/U surgical evaluation. May consider pending follow-up evaluations  F/U with dermatologist Dr. Phillip Heal as discussed  F/U neurological evaluation. May consider pending follow-up evaluations  May consider radiofrequency rhizolysis or intraspinal procedures pending response to present treatment and F/U evaluation . We will avoid such procedures at this time   Patient to call [facility] should patient have concerns prior to scheduled return appointment.

## 2015-12-03 NOTE — Progress Notes (Signed)
Patient here for medication management Safety precautions to be maintained throughout the outpatient stay will include: orient to surroundings, keep bed in low position, maintain call bell within reach at all times, provide assistance with transfer out of bed and ambulation.  

## 2015-12-03 NOTE — Patient Instructions (Addendum)
PLAN   Continue present medication Neurontin Celebrex hydrocodone acetaminophen and fentanyl patch   F/U PCP Dr. Ouida Sills for evaliation of  BP kidney stones  (nephrolithiasis )and general medical  condition  F/U surgical evaluation. May consider pending follow-up evaluations  F/U with dermatologist Dr. Phillip Heal as discussed  F/U neurological evaluation. May consider pending follow-up evaluations  May consider radiofrequency rhizolysis or intraspinal procedures pending response to present treatment and F/U evaluation . We will avoid such procedures at this time   Patient to call [facility] should patient have concerns prior to scheduled return appointment.

## 2015-12-05 ENCOUNTER — Ambulatory Visit: Payer: Medicare Other | Admitting: Pain Medicine

## 2016-01-01 ENCOUNTER — Ambulatory Visit: Payer: Medicare Other | Attending: Pain Medicine | Admitting: Pain Medicine

## 2016-01-01 ENCOUNTER — Encounter: Payer: Self-pay | Admitting: Pain Medicine

## 2016-01-01 VITALS — BP 112/68 | HR 49 | Temp 97.9°F | Resp 16 | Ht 69.0 in | Wt 170.0 lb

## 2016-01-01 DIAGNOSIS — M5416 Radiculopathy, lumbar region: Secondary | ICD-10-CM

## 2016-01-01 DIAGNOSIS — M47816 Spondylosis without myelopathy or radiculopathy, lumbar region: Secondary | ICD-10-CM | POA: Diagnosis not present

## 2016-01-01 DIAGNOSIS — G43909 Migraine, unspecified, not intractable, without status migrainosus: Secondary | ICD-10-CM | POA: Insufficient documentation

## 2016-01-01 DIAGNOSIS — M5136 Other intervertebral disc degeneration, lumbar region: Secondary | ICD-10-CM | POA: Insufficient documentation

## 2016-01-01 DIAGNOSIS — M170 Bilateral primary osteoarthritis of knee: Secondary | ICD-10-CM

## 2016-01-01 DIAGNOSIS — M5126 Other intervertebral disc displacement, lumbar region: Secondary | ICD-10-CM | POA: Diagnosis not present

## 2016-01-01 DIAGNOSIS — M51369 Other intervertebral disc degeneration, lumbar region without mention of lumbar back pain or lower extremity pain: Secondary | ICD-10-CM

## 2016-01-01 DIAGNOSIS — N200 Calculus of kidney: Secondary | ICD-10-CM | POA: Diagnosis not present

## 2016-01-01 DIAGNOSIS — M533 Sacrococcygeal disorders, not elsewhere classified: Secondary | ICD-10-CM

## 2016-01-01 DIAGNOSIS — M5481 Occipital neuralgia: Secondary | ICD-10-CM | POA: Insufficient documentation

## 2016-01-01 DIAGNOSIS — M179 Osteoarthritis of knee, unspecified: Secondary | ICD-10-CM | POA: Diagnosis not present

## 2016-01-01 DIAGNOSIS — M545 Low back pain: Secondary | ICD-10-CM | POA: Diagnosis present

## 2016-01-01 DIAGNOSIS — M1712 Unilateral primary osteoarthritis, left knee: Secondary | ICD-10-CM

## 2016-01-01 MED ORDER — FENTANYL 50 MCG/HR TD PT72: MEDICATED_PATCH | TRANSDERMAL | Status: DC

## 2016-01-01 MED ORDER — HYDROCODONE-ACETAMINOPHEN 7.5-325 MG PO TABS: ORAL_TABLET | ORAL | Status: DC

## 2016-01-01 MED ORDER — CELECOXIB 100 MG PO CAPS: ORAL_CAPSULE | ORAL | Status: DC

## 2016-01-01 NOTE — Progress Notes (Signed)
Safety precautions to be maintained throughout the outpatient stay will include: orient to surroundings, keep bed in low position, maintain call bell within reach at all times, provide assistance with transfer out of bed and ambulation.  

## 2016-01-01 NOTE — Patient Instructions (Addendum)
PLAN   Continue present medication Neurontin Celebrex hydrocodone acetaminophen and fentanyl patch   F/U PCP Dr. Ouida Sills for evaliation of  BP kidney stones  (nephrolithiasis )and general medical  Condition  Urological evaluation as discussed and as per Dr. Ouida Sills  F/U surgical evaluation. May consider pending follow-up evaluations  F/U with dermatologist Dr. Phillip Heal as discussed  F/U neurological evaluation. May consider PNCV/EMG studies and other studies pending follow-up evaluations  May consider radiofrequency rhizolysis or intraspinal procedures pending response to present treatment and F/U evaluation . We will avoid such procedures at this time   Patient to call [facility] should patient have concerns prior to scheduled return appointment.

## 2016-01-01 NOTE — Progress Notes (Signed)
Subjective:    Patient ID: Tanner Baker, male    DOB: [birth year], 65 y.o.   MRN: [id number]  HPI  The patient is a 65 year old L1 who returns to pain management for further evaluation and treatment of pain involving the lower back lower extremity region predominantly. The patient presently has had pain due to kidney stones. The patient is to follow-up with Dr. Ouida Sills to consider further treatment and evaluation including urological evaluation... The patient states that the pain involving the knee and lower back region and otherwise is well-controlled at present medication regimen. The patient will follow-up with Dr. Ouida Sills discussed further treatment of his kidney stones and we will remain available to consider patient for modification of treatment regimen as discussed and as explained to patient on today's visit. The patient states that the kidney stones have been the reason for his pain and that the pain has been quite severe at times. We will avoid modifying medications to avoid masking patient's symptoms. Patient is to proceed with evaluation with Dr. Ouida Sills and is to be considered to undergo urological evaluation as well . All agreed to suggested treatment plan     Review of Systems     Objective:   Physical Exam  There was tenderness to palpation of paraspinal muscular treat the cervical region cervical facet region a mild degree with mild tenderness of the splenius capitis and occipitalis muscles regions. Palpation over the region of the thoracic paraspinal musculature region was attends to palpation of moderate degree. There was evidence of moderate muscle spasm involving the mid and lower thoracic paraspinal musculature region with no crepitus of the thoracic region noted. Palpation over the region of the acromioclavicular and glenohumeral joint regions reproduced pain of mild to moderate discomfort. Patient appeared to be with bilaterally equal grip strength with Tinel and  Phalen's maneuver reproducing minimal discomfort. The patient was with unremarkable Spurling's maneuver. Palpation over the thoracic region was attends to palpation the mid and lower thoracic regions especially. Palpation over the lumbar paraspinal must reason lumbar facet region was attends to palpation of mild degree. Lateral bending rotation extension and palpation the lumbar facets reproduce mild to moderate discomfort. Straight leg raise was tolerated to 30 without increased pain with dorsiflexion noted. Knees were with crepitus of the knees with negative anterior and posterior drawer signs without ballottement of the patella. No increased warmth or erythema noted in the region of the knees. No sensory deficit or dermatomal distribution of the lower extremity is noted. There was negative clonus negative Homans. Abdomen was nontender. There was moderate to moderately severe tenderness to palpation costovertebral angle region.      Assessment & Plan:     Bilateral occipital neuralgia  Migraine headache  Degenerative disc disease lumbar spine  L1-L2 bulging disks, multilevel degenerative changes throughout the lumbar spine  Lumbar facet syndrome  Sacroiliac joint dysfunction  Degenerative joint disease of knee      PLAN   Continue present medication Neurontin Celebrex hydrocodone acetaminophen and fentanyl patch   F/U PCP Dr. Ouida Sills for evaliation of  BP kidney stones  (nephrolithiasis )and general medical  Condition  Urological evaluation as discussed and as per Dr. Ouida Sills  F/U surgical evaluation. May consider pending follow-up evaluations  F/U with dermatologist Dr. Phillip Heal as discussed  F/U neurological evaluation. May consider PNCV/EMG studies and other studies pending follow-up evaluations  May consider radiofrequency rhizolysis or intraspinal procedures pending response to present treatment and F/U evaluation . We will avoid such procedures  at this time    Patient to call [facility] should patient have concerns prior to scheduled return appointment.

## 2016-01-05 LAB — TOXASSURE SELECT 13 (MW), URINE

## 2016-01-07 NOTE — Progress Notes (Signed)
Quick Note:  Reviewed. ______ 

## 2016-01-30 ENCOUNTER — Encounter: Payer: Self-pay | Admitting: *Deleted

## 2016-01-31 ENCOUNTER — Encounter: Payer: Self-pay | Admitting: Pain Medicine

## 2016-01-31 ENCOUNTER — Ambulatory Visit: Payer: Medicare Other | Attending: Pain Medicine | Admitting: Pain Medicine

## 2016-01-31 VITALS — BP 108/64 | HR 45 | Temp 97.9°F | Resp 15 | Ht 69.0 in | Wt 172.0 lb

## 2016-01-31 DIAGNOSIS — M5481 Occipital neuralgia: Secondary | ICD-10-CM | POA: Diagnosis not present

## 2016-01-31 DIAGNOSIS — M545 Low back pain: Secondary | ICD-10-CM | POA: Diagnosis present

## 2016-01-31 DIAGNOSIS — M533 Sacrococcygeal disorders, not elsewhere classified: Secondary | ICD-10-CM | POA: Diagnosis not present

## 2016-01-31 DIAGNOSIS — M5136 Other intervertebral disc degeneration, lumbar region: Secondary | ICD-10-CM | POA: Diagnosis not present

## 2016-01-31 DIAGNOSIS — M5416 Radiculopathy, lumbar region: Secondary | ICD-10-CM

## 2016-01-31 DIAGNOSIS — M51369 Other intervertebral disc degeneration, lumbar region without mention of lumbar back pain or lower extremity pain: Secondary | ICD-10-CM

## 2016-01-31 DIAGNOSIS — M179 Osteoarthritis of knee, unspecified: Secondary | ICD-10-CM | POA: Diagnosis not present

## 2016-01-31 DIAGNOSIS — M170 Bilateral primary osteoarthritis of knee: Secondary | ICD-10-CM

## 2016-01-31 DIAGNOSIS — M5126 Other intervertebral disc displacement, lumbar region: Secondary | ICD-10-CM | POA: Diagnosis not present

## 2016-01-31 DIAGNOSIS — G43909 Migraine, unspecified, not intractable, without status migrainosus: Secondary | ICD-10-CM | POA: Diagnosis not present

## 2016-01-31 DIAGNOSIS — M47816 Spondylosis without myelopathy or radiculopathy, lumbar region: Secondary | ICD-10-CM

## 2016-01-31 DIAGNOSIS — M1712 Unilateral primary osteoarthritis, left knee: Secondary | ICD-10-CM

## 2016-01-31 MED ORDER — FENTANYL 50 MCG/HR TD PT72: MEDICATED_PATCH | TRANSDERMAL | Status: DC

## 2016-01-31 MED ORDER — HYDROCODONE-ACETAMINOPHEN 7.5-325 MG PO TABS: ORAL_TABLET | ORAL | Status: DC

## 2016-01-31 MED ORDER — CELECOXIB 100 MG PO CAPS: ORAL_CAPSULE | ORAL | Status: DC

## 2016-01-31 MED ORDER — GABAPENTIN 100 MG PO CAPS: ORAL_CAPSULE | ORAL | Status: DC

## 2016-01-31 NOTE — Patient Instructions (Signed)
PLAN   Continue present medication Neurontin Celebrex hydrocodone acetaminophen and fentanyl patch   F/U PCP Dr. Ouida Sills for evaliation of  BP kidney stones  (nephrolithiasis )and general medical  Condition  Urological evaluation as discussed and as per Dr. Ouida Sills  F/U surgical evaluation. May consider pending follow-up evaluations  F/U with dermatologist Dr. Phillip Heal as discussed  F/U neurological evaluation. May consider PNCV/EMG studies and other studies pending follow-up evaluations  May consider radiofrequency rhizolysis or intraspinal procedures pending response to present treatment and F/U evaluation . We will avoid such procedures at this time   Patient to call [facility] should patient have concerns prior to scheduled return appointment.

## 2016-01-31 NOTE — Progress Notes (Signed)
Safety precautions to be maintained throughout the outpatient stay will include: orient to surroundings, keep bed in low position, maintain call bell within reach at all times, provide assistance with transfer out of bed and ambulation.  

## 2016-01-31 NOTE — Progress Notes (Signed)
Subjective:    Patient ID: Tanner Baker, male    DOB: 1950-10-19, 65 y.o.   MRN: [id number]  HPI  The patient is a 65 year old gentleman who returns to pain management for further evaluation and treatment of pain involving the lower back lower extremity region and knees. The patient states that his pain is fairly well-controlled. Sent ask questions regarding Celebrex. The patient was informed that there had been reluctant to prescribe Celebrex due to the side effects of Celebrex. The patient was informed to follow-up with his primary care physician Dr. Ouida Sills to address Celebrex. We also informed patient that Dr. Ouida Sills would monitor patient's renal function as well as hepatic function and would also evaluate patient for any other side effects due to Celebrex including cardiac side effects as well as irritation of the gastric lining and other side effects which can be due to Celebrex. The patient expressed understanding and will follow-up with Dr. Ouida Sills at this time. The patient asked to have an increase of his fentanyl patch. We informed patient that we will consider increase of fentanyl patch and that we would prefer that patient address the Celebrex issue with Dr. Ouida Sills at this time and we will remain available to modify medications as needed. All agreed to suggested treatment plan.any trauma change in events of daily living the call significant change in symptomatology and stated the pain was fairly well-controlled at this time      Review of Systems     Objective:   Physical Exam  There was tenderness to palpation of the splenius capitis and occipitalis muscles regional mild to moderate degree with minimal to moderate tenderness of the cervical and thoracic facet thoracic paraspinal musculature region. The patient was with bilaterally equal grip strength and Tinel and Phalen's maneuver were without increased pain of significant degree. There was tenderness to palpation of the  acromioclavicular and glenohumeral joint region and patient appeared to be with unremarkable Spurling's maneuver. Palpation of the lumbar paraspinal musculatures and lumbar facet region was associated with mild to moderate discomfort with lateral bending rotation extension and palpation of the lumbar facets reproducing mild to moderate discomfort. There was mild tenderness of the PSIS and PII S region as well as the greater trochanteric region iliotibial band region. The knees were tenderness to palpation in crepitus of the knees without increased warmth erythema in the region of the knees. There was negative anterior and posterior drawer signs without ballottement of the patella. Straight leg raising was tolerated to 30 without increase of pain with dorsiflexion noted. There was negative clonus negative Homans. EHL strength appeared to be slightly decreased. Abdomen nontender and no costovertebral tenderness noted      Assessment & Plan:     Bilateral occipital neuralgia  Migraine headache  Degenerative disc disease lumbar spine  L1-L2 bulging disks, multilevel degenerative changes throughout the lumbar spine  Lumbar facet syndrome  Sacroiliac joint dysfunction  Degenerative joint disease of knee      PLAN   Continue present medication Neurontin Celebrex hydrocodone acetaminophen and fentanyl patch   F/U PCP Dr. Ouida Sills for evaliation of  BP kidney stones  (nephrolithiasis )and general medical  condition. Patient is also to address Celebrex with Dr. Ouida Sills including side effects of Celebrex and monitoring of patient's laboratory results to monitor the effects of Celebrex  Urological evaluation as discussed and as per Dr. Ouida Sills  F/U surgical evaluation. May consider pending follow-up evaluations  F/U with dermatologist Dr. Phillip Heal as discussed  F/U neurological  evaluation. May consider PNCV/EMG studies and other studies pending follow-up evaluations  May consider  radiofrequency rhizolysis or intraspinal procedures pending response to present treatment and F/U evaluation . We will avoid such procedures at this time   Patient to call [facility] should patient have concerns prior to scheduled return appointment.

## 2016-02-04 ENCOUNTER — Ambulatory Visit (INDEPENDENT_AMBULATORY_CARE_PROVIDER_SITE_OTHER): Payer: Medicare Other | Admitting: Urology

## 2016-02-04 ENCOUNTER — Encounter: Payer: Self-pay | Admitting: Urology

## 2016-02-04 VITALS — BP 109/65 | HR 53 | Ht 69.0 in | Wt 176.1 lb

## 2016-02-04 DIAGNOSIS — N201 Calculus of ureter: Secondary | ICD-10-CM

## 2016-02-04 DIAGNOSIS — N132 Hydronephrosis with renal and ureteral calculous obstruction: Secondary | ICD-10-CM | POA: Diagnosis not present

## 2016-02-04 DIAGNOSIS — N401 Enlarged prostate with lower urinary tract symptoms: Secondary | ICD-10-CM

## 2016-02-04 DIAGNOSIS — E876 Hypokalemia: Secondary | ICD-10-CM

## 2016-02-04 DIAGNOSIS — N138 Other obstructive and reflux uropathy: Secondary | ICD-10-CM

## 2016-02-04 LAB — URINALYSIS, COMPLETE
BILIRUBIN UA: NEGATIVE
Glucose, UA: NEGATIVE
Ketones, UA: NEGATIVE
Leukocytes, UA: NEGATIVE
Nitrite, UA: NEGATIVE
PH UA: 5.5 (ref 5.0–7.5)
PROTEIN UA: NEGATIVE
RBC UA: NEGATIVE
Specific Gravity, UA: 1.025 (ref 1.005–1.030)
UUROB: 0.2 mg/dL (ref 0.2–1.0)

## 2016-02-04 LAB — MICROSCOPIC EXAMINATION
BACTERIA UA: NONE SEEN
Epithelial Cells (non renal): NONE SEEN /hpf (ref 0–10)

## 2016-02-04 NOTE — Progress Notes (Signed)
02/04/2016 11:43 AM   Tanner Baker 12/02/1950 [id number]  Referring provider: Kirk Ruths, MD [place], [place] [address]  Chief Complaint  Patient presents with  . Nephrolithiasis    patient thinks he has one now, previous hx referred by Dr. Ouida Sills III    HPI: Patient is a 65 year old Caucasian male who is referred by his primary care physician, Dr. Ouida Sills, for history of nephrolithiasis.  Patient presents today with his wife, [name], complaining of symptoms reminiscent of an urinary stone.  He states two and one half weeks ago he felt that a stone might have passed.  He states he experienced a sharp pain when urinating and it suddenly abated.  In March, he had some gross hematuria associated with the same sensation.    One week ago, he started having dysuria and nocturia and he was placed on Septra DS.  He is not having symptoms at this time.  He did have a CT Renal stone study completed on 11/17/2015, which noted mild left hydronephrosis and hydroureter to the level of the UVJ.  He had a rounded calcification in the area of the UVJ.  He has not seen a distinct fragment.    He is not having fevers, chills, nausea or vomiting.  UA today was unremarkable.    PMH: Past Medical History  Diagnosis Date  . Hypertension   . Cancer ([place])     skin cancer  . Kidney stone   . Sleep apnea   . Migraine   . Chronic pain syndrome   . Traumatic arthritis   . Depression   . DDD (degenerative disc disease), cervical   . Migraine   . Post-traumatic headache   . Heartburn   . Anxiety   . Depression   . Glaucoma     Surgical History: Past Surgical History  Procedure Laterality Date  . Wisdom tooth extraction    . Tonsillectomy    . Arm surg Right 1995  . Cosmetic surgery  1994    all of teeth  . Skin cancer excision      Home Medications:    Medication List       This list is accurate as of: 02/04/16 11:43 AM.  Always  use your most recent med list.               aspirin 81 MG chewable tablet  Chew 81 mg by mouth. Reported on 02/04/2016     b complex vitamins tablet  Take 1 tablet by mouth 2 (two) times daily. Reported on 02/04/2016     busPIRone 15 MG tablet  Commonly known as:  BUSPAR     cefUROXime 250 MG tablet  Commonly known as:  CEFTIN  Take 1 tablet (250 mg total) by mouth 2 (two) times daily with a meal.     celecoxib 100 MG capsule  Commonly known as:  CELEBREX  Limit 1 tablet by mouth per day or twice per day if tolerated     cephALEXin 500 MG capsule  Commonly known as:  KEFLEX  Take 1 capsule (500 mg total) by mouth 2 (two) times daily.     ciprofloxacin 500 MG tablet  Commonly known as:  CIPRO  Take 500 mg by mouth 2 (two) times daily. Reported on 02/04/2016     cyclobenzaprine 10 MG tablet  Commonly known as:  FLEXERIL  Take by mouth. Reported on 02/04/2016     fentaNYL  50 MCG/HR  Commonly known as:  [place] - dosed mcg/hr  Applied 1 patch to skin every 3 days if tolerated     ferrous sulfate 325 (65 FE) MG tablet  Take 325 mg by mouth daily with breakfast.     FLONASE 50 MCG/ACT nasal spray  Generic drug:  fluticasone     gabapentin 100 MG capsule  Commonly known as:  NEURONTIN  Limit 2 - 3 tabs bid to tid if tolerated     gabapentin 100 MG capsule  Commonly known as:  NEURONTIN  Limit  2 - 3  tabs by mouth  bid to tid if tolerated     hydrochlorothiazide 25 MG tablet  Commonly known as:  HYDRODIURIL  Take 25 mg by mouth.     HYDROcodone-acetaminophen 7.5-325 MG tablet  Commonly known as:  NORCO  Limit 1 tablet daily, bid to tid for breakthrough pain if needed and if tolerated while wearing fentanyl patch     ketorolac 10 MG tablet  Commonly known as:  TORADOL  Take 1 tablet (10 mg total) by mouth every 8 (eight) hours as needed.     latanoprost 0.005 % ophthalmic solution  Commonly known as:  XALATAN     levETIRAcetam 500 MG tablet  Commonly known as:   KEPPRA  Take 500 mg by mouth 4 (four) times daily. Reported on 02/04/2016     Melatonin 1 MG Tabs  Take 1 mg by mouth. Reported on 02/04/2016     MULTI-VITAMINS Tabs  Take by mouth. Reported on 02/04/2016     omeprazole 20 MG capsule  Commonly known as:  PRILOSEC  Take 20 mg by mouth 2 (two) times daily before a meal. Reported on 02/04/2016     ondansetron 4 MG tablet  Commonly known as:  ZOFRAN  Take 1 tablet (4 mg total) by mouth every 8 (eight) hours as needed for nausea or vomiting.     PARoxetine 20 MG tablet  Commonly known as:  PAXIL  Take 20 mg by mouth daily.     polyethylene glycol powder powder  Commonly known as:  GLYCOLAX/MIRALAX     propranolol 20 MG tablet  Commonly known as:  INDERAL  Take 60 mg by mouth 2 (two) times daily.     ranitidine 150 MG tablet  Commonly known as:  ZANTAC  Take 150 mg by mouth 2 (two) times daily.     riboflavin 100 MG Tabs tablet  Commonly known as:  VITAMIN B-2  Take 400 mg by mouth. Reported on 02/04/2016     SUMAtriptan 100 MG tablet  Commonly known as:  IMITREX  Take 100 mg by mouth once. May repeat in 2 hours if headache persists or recurs.     tamsulosin 0.4 MG Caps capsule  Commonly known as:  FLOMAX  Take 1 capsule (0.4 mg total) by mouth daily.     traZODone 50 MG tablet  Commonly known as:  DESYREL  Take 100 mg by mouth at bedtime as needed for sleep. Take 1-2 tablets at bedtime as needed     VITAMIN B 12 PO  Take 250 mg by mouth every morning. Reported on 02/04/2016     VITAMIN D-3 PO  Take 1,000 Int'l Units/day by mouth every morning. Reported on 01/01/2016     zolpidem 5 MG tablet  Commonly known as:  AMBIEN  Take 5 mg by mouth. Reported on 02/04/2016        Allergies:  Allergies  Allergen  Reactions  . Doxycycline Rash    Family History: Family History  Problem Relation Age of Onset  . Cancer Mother     melanoma  . Diabetes Father   . Heart disease Father   . Cancer Father     melanoma  . Lung cancer  Mother   . Kidney disease Neg Hx   . Prostate cancer Paternal Uncle   . Kidney Stones Father     Social History:  reports that he has quit smoking. He has never used smokeless tobacco. He reports that he does not drink alcohol or use illicit drugs.  ROS: UROLOGY Frequent Urination?: No Hard to postpone urination?: No Burning/pain with urination?: No Get up at night to urinate?: Yes Leakage of urine?: No Urine stream starts and stops?: No Trouble starting stream?: No Do you have to strain to urinate?: No Blood in urine?: No Urinary tract infection?: Yes Sexually transmitted disease?: No Injury to kidneys or bladder?: No Painful intercourse?: No Weak stream?: No Erection problems?: Yes Penile pain?: No  Gastrointestinal Nausea?: No Vomiting?: No Indigestion/heartburn?: Yes Diarrhea?: No Constipation?: Yes  Constitutional Fever: No Night sweats?: No Weight loss?: No Fatigue?: No  Skin Skin rash/lesions?: No Itching?: No  Eyes Blurred vision?: No Double vision?: No  Ears/Nose/Throat Sore throat?: No Sinus problems?: Yes  Hematologic/Lymphatic Swollen glands?: No Easy bruising?: Yes  Cardiovascular Leg swelling?: No Chest pain?: No  Respiratory Cough?: No Shortness of breath?: No  Endocrine Excessive thirst?: No  Musculoskeletal Back pain?: Yes Joint pain?: Yes  Neurological Headaches?: Yes Dizziness?: No  Psychologic Depression?: No Anxiety?: Yes  Physical Exam: BP 109/65 mmHg  Pulse 53  Ht 5\' 9"  (1.753 m)  Wt 176 lb 1.6 oz (79.878 kg)  BMI 25.99 kg/m2  Constitutional: Well nourished. Alert and oriented, No acute distress. HEENT: [place] AT, moist mucus membranes. Trachea midline, no masses. Cardiovascular: No clubbing, cyanosis, or edema. Respiratory: Normal respiratory effort, no increased work of breathing. GI: Abdomen is soft, non tender, non distended, no abdominal masses. Liver and spleen not palpable.  No hernias appreciated.   Stool sample for occult testing is not indicated.   GU: No CVA tenderness.  No bladder fullness or masses.  Patient with normal phallus.   Urethral meatus is patent.  No penile discharge. No penile lesions or rashes. Scrotum without lesions, cysts, rashes and/or edema.  Testicles are located scrotally bilaterally. No masses are appreciated in the testicles. Left and right epididymis are normal. Rectal: Patient with  normal sphincter tone. Anus and perineum without scarring or rashes. No rectal masses are appreciated. Prostate is approximately 45 grams, no nodules are appreciated. Seminal vesicles are normal. Skin: No rashes, bruises or suspicious lesions. Lymph: No cervical or inguinal adenopathy. Neurologic: Grossly intact, no focal deficits, moving all 4 extremities. Psychiatric: Normal mood and affect.  Laboratory Data: Lab Results  Component Value Date   WBC 7.8 11/17/2015   HGB 14.6 11/17/2015   HCT 41.7 11/17/2015   MCV 90.6 11/17/2015   PLT 235 11/17/2015    Lab Results  Component Value Date   CREATININE 0.96 11/17/2015    Lab Results  Component Value Date   AST 24 11/17/2015   Lab Results  Component Value Date   ALT 17 11/17/2015     Urinalysis Results for orders placed or performed in visit on 02/04/16  Microscopic Examination  Result Value Ref Range   WBC, UA 0-5 0 -  5 /hpf   RBC, UA 0-2 0 -  2 /hpf   Epithelial Cells (non renal) None seen 0 - 10 /hpf   Mucus, UA Present (A) Not Estab.   Bacteria, UA None seen None seen/Few  Urinalysis, Complete  Result Value Ref Range   Specific Gravity, UA 1.025 1.005 - 1.030   pH, UA 5.5 5.0 - 7.5   Color, UA Yellow Yellow   Appearance Ur Clear Clear   Leukocytes, UA Negative Negative   Protein, UA Negative Negative/Trace   Glucose, UA Negative Negative   Ketones, UA Negative Negative   RBC, UA Negative Negative   Bilirubin, UA Negative Negative   Urobilinogen, Ur 0.2 0.2 - 1.0 mg/dL   Nitrite, UA Negative Negative    Microscopic Examination See below:   PSA  Result Value Ref Range   Prostate Specific Ag, Serum 0.7 0.0 - 4.0 ng/mL  Basic metabolic panel  Result Value Ref Range   Glucose 88 65 - 99 mg/dL   BUN 20 8 - 27 mg/dL   Creatinine, Ser 0.98 0.76 - 1.27 mg/dL   GFR calc non Af Amer 82 >59 mL/min/1.73   GFR calc Af Amer 94 >59 mL/min/1.73   BUN/Creatinine Ratio 20 10 - 24   Sodium 142 134 - 144 mmol/L   Potassium 3.4 (L) 3.5 - 5.2 mmol/L   Chloride 95 (L) 96 - 106 mmol/L   CO2 28 18 - 29 mmol/L   Calcium 9.4 8.6 - 10.2 mg/dL    Pertinent Imaging: CLINICAL DATA: Severe flank pain for the past 6 days. Nausea and vomiting. Hematuria.  EXAM: CT ABDOMEN AND PELVIS WITHOUT CONTRAST  TECHNIQUE: Multidetector CT imaging of the abdomen and pelvis was performed following the standard protocol without IV contrast.  COMPARISON: Renal ultrasound dated 11/17/2015.  FINDINGS: Lower chest: 3 x 2 mm nodule in the right middle lobe on image 2. 3 mm subpleural nodule in the left lower lobe on image number 20. 1 mm subpleural nodule in the left lower lobe on image 17. Small right lower lobe bulla.  Hepatobiliary: Unremarkable non contrasted appearance of the liver and gallbladder.  Pancreas: No mass or inflammatory process identified on this un-enhanced exam.  Spleen: Within normal limits in size.  Adrenals/Urinary Tract: 3 mm mid right renal calculus. Small right renal cyst. There is also a lower pole left renal cyst. Mild dilatation of the left renal collecting system and ureter to the level of a 4 mm round calcification adjacent to the ureterovesical junction. It is difficult to determine if this is adjacent to or within the ureter. Otherwise, no ureteral or bladder calculi are seen. Mild left perinephric soft tissue stranding. Normal appearing adrenal glands.  Stomach/Bowel: No gastrointestinal abnormalities are seen. Normal appearing appendix.  Vascular/Lymphatic:  Minimal atheromatous arterial calcifications. Mildly prominent portacaval lymph node with a short axis diameter of 12 mm on image number 25. There are are 2 adjacent mildly prominent lymph nodes more medially, 1 with a short axis diameter of 10 mm on image number 20 and 1 with a short axis diameter of 7 mm on image 18.  Reproductive: Minimally enlarged prostate gland containing minimal coarse calcification.  Other: Small left inguinal hernia an incompletely descended testis inferiorly.  Musculoskeletal: Lumbar and lower thoracic spine degenerative changes. These include facet degenerative changes with associated grade 1 anterolisthesis at the L4-5 level. There are also bilateral pars defects at the L5 level with minimal anterolisthesis. There is an approximately 30% superior endplate compression deformity of the L1 vertebral body with no acute fracture  lines or bony retropulsion. 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  IMPRESSION: 1. Multiple small nodules at the lung bases, as described above. No follow-up needed if patient is low-risk (and has no known or suspected primary neoplasm). Non-contrast chest CT can be considered in 12 months if patient is high-risk. This recommendation follows the consensus statement: Guidelines for Management of Incidental Pulmonary Nodules Detected on CT Images:From the Fleischner Society 2017; published online before print (10.1148/radiol.IJ:2314499). 2. Mild left hydronephrosis and hydroureter to the level of the ureterovesical junction. There is a 4 mm rounded calcification in that location. It is difficult to determine if this is in the ureter or an adjacent phlebolith. 3. 3 mm nonobstructing right renal calculus. 4. Small left inguinal hernia containing an incompletely descended testis inferiorly.   Electronically Signed  By: Claudie Revering M.D.  On: 11/17/2015 12:32   Assessment & Plan:    1. Left ureteral stone:   Unsure  if patient has passed this stone.  RUS will be obtained in one month.  He will follow up for the report.    - Urinalysis, Complete - CULTURE, URINE COMPREHENSIVE  2. Left hydronephrosis:   Patient was found to have left hydronephrosis due to an left ureteral stone.  A RUS will be obtained in one month to ensure the hydronephrosis has resolved.    3. Hypokalemia:   Patient's potassium was found to be 2.9 on 11/17/2015.  He states he was instructed to take potassium, but he states his potassium level has not been rechecked. I will send a potassium level today.  4. BPH with LUTS:   Patient has intermittent nocturia. I will check a PSA today as he had not had a screening and some time. He'll be contacted with those results.   Return for follow up pending labs.  These notes generated with voice recognition software. I apologize for typographical errors.  Zara Council, [facility] [address], [place], [place] [address] [phone]

## 2016-02-05 ENCOUNTER — Telehealth: Payer: Self-pay

## 2016-02-05 LAB — BASIC METABOLIC PANEL
BUN / CREAT RATIO: 20 (ref 10–24)
BUN: 20 mg/dL (ref 8–27)
CO2: 28 mmol/L (ref 18–29)
CREATININE: 0.98 mg/dL (ref 0.76–1.27)
Calcium: 9.4 mg/dL (ref 8.6–10.2)
Chloride: 95 mmol/L — ABNORMAL LOW (ref 96–106)
GFR, EST AFRICAN AMERICAN: 94 mL/min/{1.73_m2} (ref >59)
GFR, EST NON AFRICAN AMERICAN: 82 mL/min/{1.73_m2} (ref >59)
Glucose: 88 mg/dL (ref 65–99)
Potassium: 3.4 mmol/L — ABNORMAL LOW (ref 3.5–5.2)
Sodium: 142 mmol/L (ref 134–144)

## 2016-02-05 LAB — PSA: Prostate Specific Ag, Serum: 0.7 ng/mL (ref 0.0–4.0)

## 2016-02-05 NOTE — Telephone Encounter (Signed)
Spoke with pt in reference to lab results. Pt voiced understanding.  

## 2016-02-05 NOTE — Telephone Encounter (Signed)
-----   Message from Nori Riis, PA-C sent at 02/05/2016  7:45 AM EDT ----- Potassium has improved. PSA is still pending.

## 2016-02-05 NOTE — Telephone Encounter (Signed)
-----   Message from Nori Riis, PA-C sent at 02/05/2016 10:05 AM EDT ----- PSA is normal.

## 2016-02-07 ENCOUNTER — Telehealth: Payer: Self-pay | Admitting: Urology

## 2016-02-07 DIAGNOSIS — N200 Calculus of kidney: Secondary | ICD-10-CM

## 2016-02-07 LAB — CULTURE, URINE COMPREHENSIVE

## 2016-02-07 NOTE — Telephone Encounter (Signed)
Patient will need a RUS in one month to ensure the left kidney has healed.

## 2016-02-08 NOTE — Telephone Encounter (Signed)
Spoke with pt in reference to RUS needed in 48mo. Pt voiced understanding. Orders placed.

## 2016-02-15 ENCOUNTER — Ambulatory Visit
Admission: RE | Admit: 2016-02-15 | Discharge: 2016-02-15 | Disposition: A | Discharge status: Home / Self Care | Payer: Medicare Other | Source: Ambulatory Visit | Attending: Urology | Admitting: Urology

## 2016-02-15 DIAGNOSIS — N329 Bladder disorder, unspecified: Secondary | ICD-10-CM | POA: Diagnosis not present

## 2016-02-15 DIAGNOSIS — N281 Cyst of kidney, acquired: Secondary | ICD-10-CM | POA: Insufficient documentation

## 2016-02-15 DIAGNOSIS — N200 Calculus of kidney: Secondary | ICD-10-CM

## 2016-02-18 ENCOUNTER — Encounter: Payer: Self-pay | Admitting: Pain Medicine

## 2016-02-18 ENCOUNTER — Ambulatory Visit: Payer: Medicare Other | Attending: Pain Medicine | Admitting: Pain Medicine

## 2016-02-18 ENCOUNTER — Telehealth: Payer: Self-pay | Admitting: Urology

## 2016-02-18 VITALS — BP 114/64 | HR 50 | Temp 98.1°F | Resp 16 | Ht 69.0 in | Wt 174.0 lb

## 2016-02-18 DIAGNOSIS — M5126 Other intervertebral disc displacement, lumbar region: Secondary | ICD-10-CM | POA: Insufficient documentation

## 2016-02-18 DIAGNOSIS — M5136 Other intervertebral disc degeneration, lumbar region: Secondary | ICD-10-CM | POA: Diagnosis not present

## 2016-02-18 DIAGNOSIS — M51369 Other intervertebral disc degeneration, lumbar region without mention of lumbar back pain or lower extremity pain: Secondary | ICD-10-CM

## 2016-02-18 DIAGNOSIS — M545 Low back pain: Secondary | ICD-10-CM | POA: Diagnosis present

## 2016-02-18 DIAGNOSIS — M79606 Pain in leg, unspecified: Secondary | ICD-10-CM | POA: Diagnosis present

## 2016-02-18 DIAGNOSIS — M533 Sacrococcygeal disorders, not elsewhere classified: Secondary | ICD-10-CM | POA: Diagnosis not present

## 2016-02-18 DIAGNOSIS — M5416 Radiculopathy, lumbar region: Secondary | ICD-10-CM

## 2016-02-18 DIAGNOSIS — M1712 Unilateral primary osteoarthritis, left knee: Secondary | ICD-10-CM

## 2016-02-18 DIAGNOSIS — M47816 Spondylosis without myelopathy or radiculopathy, lumbar region: Secondary | ICD-10-CM

## 2016-02-18 DIAGNOSIS — M5481 Occipital neuralgia: Secondary | ICD-10-CM | POA: Diagnosis not present

## 2016-02-18 DIAGNOSIS — M170 Bilateral primary osteoarthritis of knee: Secondary | ICD-10-CM

## 2016-02-18 DIAGNOSIS — G43909 Migraine, unspecified, not intractable, without status migrainosus: Secondary | ICD-10-CM | POA: Insufficient documentation

## 2016-02-18 DIAGNOSIS — M1710 Unilateral primary osteoarthritis, unspecified knee: Secondary | ICD-10-CM | POA: Insufficient documentation

## 2016-02-18 MED ORDER — HYDROCODONE-ACETAMINOPHEN 7.5-325 MG PO TABS: ORAL_TABLET | ORAL | Status: DC

## 2016-02-18 MED ORDER — GABAPENTIN 100 MG PO CAPS: ORAL_CAPSULE | ORAL | Status: DC

## 2016-02-18 MED ORDER — FENTANYL 50 MCG/HR TD PT72: MEDICATED_PATCH | TRANSDERMAL | Status: DC

## 2016-02-18 NOTE — Patient Instructions (Addendum)
PLAN   Continue present medication Neurontin hydrocodone acetaminophen and fentanyl patch    NO CELEBREX  F/U PCP Dr. Ouida Sills for evaliation of  BP and general medical  Condition  F/U with kidney physician (nephrologist) for evaluation as discussed and as per Dr. Ouida Sills  F/U surgical evaluation. May consider pending follow-up evaluations  F/U with dermatologist Dr. Phillip Heal as discussed  F/U neurological evaluation. May consider PNCV/EMG studies and other studies pending follow-up evaluations  May consider radiofrequency rhizolysis or intraspinal procedures pending response to present treatment and F/U evaluation . We will avoid such procedures at this time   Patient to call [facility] should patient have concerns prior to scheduled return appointment.

## 2016-02-18 NOTE — Progress Notes (Signed)
Had a renal ultrasound 02/15/16; shows bilateral renal cysts. Seeing [facility].

## 2016-02-18 NOTE — Telephone Encounter (Signed)
Patient has a follow up appt on 03/07/2016 to discuss [place] results.  He is calling the office today to obtain his [place] results.    Should I move his appointment sooner or have him wait until his appointment.

## 2016-02-18 NOTE — Progress Notes (Signed)
   Subjective:    Patient ID: Tanner Baker, male    DOB: 08-16-1951, 65 y.o.   MRN: [id number]  HPI  The patient is a 65 year old gentleman who returns to pain management for further evaluation and treatment of pain involving the lower back and lower extremity region predominantly. The patient has had pain involving the region of the left knee as well as lower back and lower extremity region. At the present time patient undergo follow-up evaluation of his renal status. The patient also will avoid use of Celebrex. We discussed patient's condition and will continue Neurontin hydrocodone acetaminophen and fentanyl patch and will consider patient for additional modifications of treatment regimen including interventional treatment pending response to treatment as well as pending evaluation by nephrologist and pending follow-up evaluation in pain management. The patient was with understanding and agreed with suggested treatment plan   Review of Systems     Objective:   Physical Exam  There was tenderness of the splenius capitis and occipitalis regions of mild degree with mild tenderness over the cervical facet cervical paraspinal musculature regions. Palpation over the region of the thoracic facet thoracic paraspinal musculature region was with mild tenderness to palpation as well. There was mild tenderness of the acromioclavicular and glenohumeral joint regions and patient was with unremarkable Spurling's maneuver. The patient appeared to be with bilaterally equal grip strength. Tinel and Phalen's maneuver were without increased pain of significant degree. Palpation of the lumbar region was attends to palpation of mild to moderate degree with lateral bending rotation extension and palpation of the lumbar facets reproducing mild to moderate discomfort. There was mild tenderness over the PSIS and PII S region is well. There was mild tenderness of the greater trochanteric region iliotibial band region.  Straight leg raising was tolerated to 30 without increased pain with dorsiflexion noted. Knees were tenderness to palpation of mild degree with crepitus of the knees with negative anterior and posterior drawer signs without ballottement of the patella. EHL strength was slightly decreased. There was negative clonus negative Homans. No definite sensory deficit or dermatomal distribution detected. Abdomen nontender with no costovertebral tenderness noted      Assessment & Plan:     Bilateral occipital neuralgia  Migraine headache  Degenerative disc disease lumbar spine  L1-L2 bulging disks, multilevel degenerative changes throughout the lumbar spine  Lumbar facet syndrome  Sacroiliac joint dysfunction  Degenerative joint disease of knee     PLAN   Continue present medication Neurontin hydrocodone acetaminophen and fentanyl patch    NO CELEBREX  F/U PCP Dr. Ouida Sills for evaliation of  BP and general medical  condition  F/U with kidney physician (nephrologist) for evaluation as discussed and as per Dr. Ouida Sills  F/U surgical evaluation. May consider pending follow-up evaluations  F/U with dermatologist Dr. Phillip Heal as discussed  F/U neurological evaluation. May consider PNCV/EMG studies and other studies pending follow-up evaluations  May consider radiofrequency rhizolysis or intraspinal procedures pending response to present treatment and F/U evaluation . We will avoid such procedures at this time   Patient to call [facility] should patient have concerns prior to scheduled return appointment.

## 2016-02-20 ENCOUNTER — Telehealth: Payer: Self-pay

## 2016-02-20 NOTE — Telephone Encounter (Signed)
-----   Message from Nori Riis, PA-C sent at 02/18/2016  3:38 PM EDT ----- Patient will need appointment moved up to talk about his RUS report.

## 2016-02-21 ENCOUNTER — Encounter: Payer: Medicare Other | Admitting: Pain Medicine

## 2016-02-21 NOTE — Telephone Encounter (Signed)
LM for the patient to CB to move appt up   [facility]

## 2016-02-21 NOTE — Telephone Encounter (Signed)
Patient is coming in on the 26th  Tanner Baker

## 2016-02-25 ENCOUNTER — Encounter: Payer: Self-pay | Admitting: Urology

## 2016-02-25 ENCOUNTER — Ambulatory Visit (INDEPENDENT_AMBULATORY_CARE_PROVIDER_SITE_OTHER): Payer: Medicare Other | Admitting: Urology

## 2016-02-25 VITALS — BP 114/67 | HR 47 | Ht 69.0 in | Wt 176.7 lb

## 2016-02-25 DIAGNOSIS — N329 Bladder disorder, unspecified: Secondary | ICD-10-CM | POA: Diagnosis not present

## 2016-02-25 DIAGNOSIS — E876 Hypokalemia: Secondary | ICD-10-CM

## 2016-02-25 DIAGNOSIS — N201 Calculus of ureter: Secondary | ICD-10-CM | POA: Diagnosis not present

## 2016-02-25 DIAGNOSIS — N401 Enlarged prostate with lower urinary tract symptoms: Secondary | ICD-10-CM | POA: Diagnosis not present

## 2016-02-25 DIAGNOSIS — N132 Hydronephrosis with renal and ureteral calculous obstruction: Secondary | ICD-10-CM

## 2016-02-25 DIAGNOSIS — R310 Gross hematuria: Secondary | ICD-10-CM | POA: Diagnosis not present

## 2016-02-25 DIAGNOSIS — N138 Other obstructive and reflux uropathy: Secondary | ICD-10-CM

## 2016-02-25 NOTE — Progress Notes (Signed)
2:27 PM   Tanner Baker July 25, 1951 [id number]  Referring provider: Kirk Ruths, MD [place], [place] [id number]  Chief Complaint  Patient presents with  . Nephrolithiasis    follow up  . Results    RUS    HPI: Patient is a 65 year old Caucasian male who presents today to discuss his renal ultrasound results.  Background history Patient was referred by his primary care physician, Dr. Ouida Sills, for history of nephrolithiasis.  Patient presents today with his wife, [name], complaining of symptoms reminiscent of an urinary stone.  He states two and one half weeks ago he felt that a stone might have passed.  He states he experienced a sharp pain when urinating and it suddenly abated.  In March, he had some gross hematuria associated with the same sensation.  One week ago, he started having dysuria and nocturia and he was placed on Septra DS.  He is not having symptoms at this time.  He did have a CT Renal stone study completed on 11/17/2015, which noted mild left hydronephrosis and hydroureter to the level of the UVJ.  He had a rounded calcification in the area of the UVJ.  He has not seen a distinct fragment.  He was not having fevers, chills, nausea or vomiting.  UA  was unremarkable.    Today, he is still experiencing intermittent nocturia. He has not had any gross hematuria, dysuria or suprapubic pain. He also has not experienced any flank pain. He has not had fevers, chills, nausea or vomiting.  Renal ultrasound performed on 02/15/2016 noted bilateral renal cysts, no hydronephrosis and an indeterminate lesion along the right lateral wall of the urinary bladder.  I have reviewed the images with the patient.  PMH: Past Medical History  Diagnosis Date  . Hypertension   . Cancer ([place])     skin cancer  . Kidney stone   . Sleep apnea   . Migraine   . Chronic pain syndrome   . Traumatic arthritis   . Depression   . DDD (degenerative  disc disease), cervical   . Migraine   . Post-traumatic headache   . Heartburn   . Anxiety   . Depression   . Glaucoma     Surgical History: Past Surgical History  Procedure Laterality Date  . Wisdom tooth extraction    . Tonsillectomy    . Arm surg Right 1995  . Cosmetic surgery  1994    all of teeth  . Skin cancer excision      Home Medications:    Medication List       This list is accurate as of: 02/25/16  2:27 PM.  Always use your most recent med list.               busPIRone 15 MG tablet  Commonly known as:  BUSPAR  Take 15 mg by mouth 3 (three) times daily.     celecoxib 100 MG capsule  Commonly known as:  CELEBREX  Reported on 02/25/2016     fentaNYL 50 MCG/HR  Commonly known as:  DURAGESIC - dosed mcg/hr  Applied 1 patch to skin every 3 days if tolerated     ferrous sulfate 325 (65 FE) MG tablet  Take 325 mg by mouth daily with breakfast.     FLONASE 50 MCG/ACT nasal spray  Generic drug:  fluticasone  Place 1 spray into both nostrils daily as needed.  gabapentin 100 MG capsule  Commonly known as:  NEURONTIN  Limit 2 - 3 tabs bid to tid if tolerated     hydrochlorothiazide 25 MG tablet  Commonly known as:  HYDRODIURIL  Take 25 mg by mouth daily.     HYDROcodone-acetaminophen 7.5-325 MG tablet  Commonly known as:  NORCO  Limit 1 tablet daily, bid to tid for breakthrough pain if needed and if tolerated while wearing fentanyl patch     ketorolac 10 MG tablet  Commonly known as:  TORADOL  Take 1 tablet (10 mg total) by mouth every 8 (eight) hours as needed.     latanoprost 0.005 % ophthalmic solution  Commonly known as:  XALATAN  Place 2 drops into both eyes at bedtime.     ondansetron 4 MG tablet  Commonly known as:  ZOFRAN  Take 1 tablet (4 mg total) by mouth every 8 (eight) hours as needed for nausea or vomiting.     PARoxetine 20 MG tablet  Commonly known as:  PAXIL  Take 20 mg by mouth daily.     polyethylene glycol powder powder   Commonly known as:  GLYCOLAX/MIRALAX  Take by mouth daily.     propranolol 20 MG tablet  Commonly known as:  INDERAL  Take 60 mg by mouth 2 (two) times daily.     ranitidine 150 MG tablet  Commonly known as:  ZANTAC  Take 150 mg by mouth 2 (two) times daily.     SUMAtriptan 100 MG tablet  Commonly known as:  IMITREX  Take 100 mg by mouth once. May repeat in 2 hours if headache persists or recurs.     tamsulosin 0.4 MG Caps capsule  Commonly known as:  FLOMAX  Take 1 capsule (0.4 mg total) by mouth daily.     traZODone 50 MG tablet  Commonly known as:  DESYREL  Take 100 mg by mouth at bedtime as needed for sleep. Take 1-2 tablets at bedtime as needed     VITAMIN B 12 PO  Take 250 mg by mouth every morning. Reported on 02/25/2016     VITAMIN D-3 PO  Take 1,000 Int'l Units/day by mouth every morning. Reported on 01/01/2016        Allergies:  Allergies  Allergen Reactions  . Doxycycline Rash    Family History: Family History  Problem Relation Age of Onset  . Cancer Mother     melanoma  . Diabetes Father   . Heart disease Father   . Cancer Father     melanoma  . Lung cancer Mother   . Kidney disease Neg Hx   . Prostate cancer Paternal Uncle   . Kidney Stones Father     Social History:  reports that he has quit smoking. He has never used smokeless tobacco. He reports that he does not drink alcohol or use illicit drugs.  ROS: UROLOGY Frequent Urination?: No Hard to postpone urination?: No Burning/pain with urination?: No Get up at night to urinate?: Yes Leakage of urine?: No Urine stream starts and stops?: No Trouble starting stream?: No Do you have to strain to urinate?: No Blood in urine?: No Urinary tract infection?: No Sexually transmitted disease?: No Injury to kidneys or bladder?: No Painful intercourse?: No Weak stream?: No Erection problems?: No Penile pain?: No  Gastrointestinal Nausea?: No Vomiting?: No Indigestion/heartburn?:  Yes Diarrhea?: No Constipation?: Yes  Constitutional Fever: No Night sweats?: No Weight loss?: No Fatigue?: No  Skin Skin rash/lesions?: No Itching?: No  Eyes Blurred vision?: No Double vision?: No  Ears/Nose/Throat Sore throat?: No Sinus problems?: Yes  Hematologic/Lymphatic Swollen glands?: No Easy bruising?: Yes  Cardiovascular Leg swelling?: No Chest pain?: No  Respiratory Cough?: No Shortness of breath?: No  Endocrine Excessive thirst?: No  Musculoskeletal Back pain?: Yes Joint pain?: Yes  Neurological Headaches?: Yes Dizziness?: No  Psychologic Depression?: No Anxiety?: Yes  Physical Exam: BP 114/67 mmHg  Pulse 47  Ht 5\' 9"  (1.753 m)  Wt 176 lb 11.2 oz (80.151 kg)  BMI 26.08 kg/m2  Constitutional: Well nourished. Alert and oriented, No acute distress. HEENT: [place] AT, moist mucus membranes. Trachea midline, no masses. Cardiovascular: No clubbing, cyanosis, or edema. Respiratory: Normal respiratory effort, no increased work of breathing. GI: Abdomen is soft, non tender, non distended, no abdominal masses. Liver and spleen not palpable.  No hernias appreciated.  Stool sample for occult testing is not indicated.   GU: No CVA tenderness.  No bladder fullness or masses.  Patient with normal phallus.   Urethral meatus is patent.  No penile discharge. No penile lesions or rashes. Scrotum without lesions, cysts, rashes and/or edema.  Testicles are located scrotally bilaterally. No masses are appreciated in the testicles. Left and right epididymis are normal. Rectal: Patient with  normal sphincter tone. Anus and perineum without scarring or rashes. No rectal masses are appreciated. Prostate is approximately 45 grams, no nodules are appreciated. Seminal vesicles are normal. Skin: No rashes, bruises or suspicious lesions. Lymph: No cervical or inguinal adenopathy. Neurologic: Grossly intact, no focal deficits, moving all 4 extremities. Psychiatric: Normal  mood and affect.  Laboratory Data: Lab Results  Component Value Date   WBC 7.8 11/17/2015   HGB 14.6 11/17/2015   HCT 41.7 11/17/2015   MCV 90.6 11/17/2015   PLT 235 11/17/2015    Lab Results  Component Value Date   CREATININE 0.98 02/04/2016    Lab Results  Component Value Date   AST 24 11/17/2015   Lab Results  Component Value Date   ALT 17 11/17/2015   PSA history  0.7 ng/mL on 02/04/2016   Pertinent Imaging: CLINICAL DATA: Kidney stones  EXAM: RENAL / URINARY TRACT ULTRASOUND COMPLETE  COMPARISON: None.  FINDINGS: Right Kidney:  Length: 10.8 cm. Upper pole cyst measures 1.5 x 1.6 x 1.8 cm. Echogenicity within normal limits. No mass or hydronephrosis visualized.  Left Kidney:  Length: 12.4 cm. Inferior pole cyst measures 2.4 x 1.9 x 1.5 cm. Within the midpole there is a cyst measuring 1.5 x 1.4 x 1.7 cm. Echogenicity within normal limits. No mass or hydronephrosis visualized.  Bladder:  Solid-appearing mass defect along the right lateral wall of the bladder measures 1.3 x 1.3 x 1.9 cm.  IMPRESSION: 1. Bilateral renal cysts. 2. No hydronephrosis. 3. Indeterminate lesion along the right lateral wall of the urinary bladder. Cannot rule out urothelial lesion. Recommend further evaluation with hematuria protocol CT or MRI versus direct visualization.   Electronically Signed  By: Kerby Moors M.D.  On: 02/15/2016 08:14    Assessment & Plan:    1. Left ureteral stone:   RUS did not demonstrate hydronephrosis.  He will be scheduled for a CT Urogram.    2. Left hydronephrosis:  Resolved.    3. Hypokalemia:   Patient's potassium was 3.4 on 02/04/2016.  He is to follow up with his PCP.    4. BPH with LUTS:   Patient has intermittent nocturia. Suggest yearly screening.    5. Bladder lesion:   Schedule  CT Urogram and cystoscopy.    6. Gross hematuria:    I explained to the patient that there are a number of causes that can be  associated with blood in the urine, such as stones, BPH, UTI's, damage to the urinary tract and/or cancer.  At this time, I felt that the patient warranted further urologic evaluation.   The AUA guidelines state that a CT urogram is the preferred imaging study to evaluate hematuria.  I explained to the patient that a contrast material will be injected into a vein and that in rare instances, an allergic reaction can result and may even life threatening   The patient denies any allergies to contrast, iodine and/or seafood and is not taking metformin.  Following the imaging study,  I've recommended a cystoscopy. I described how this is performed, typically in an office setting with a flexible cystoscope. We described the risks, benefits, and possible side effects, the most common of which is a minor amount of blood in the urine and/or burning which usually resolves in 24 to 48 hours.    The patient had the opportunity to ask questions which were answered. Based upon this discussion, the patient is willing to proceed. Therefore, I've ordered: a CT Urogram and cystoscopy.  He will return following all of the above for discussion of the results.      Return for CT Urogram report and cystoscopy for hematuria and indeterment lesion of the right lateral wall of  .  These notes generated with voice recognition software. I apologize for typographical errors.  Zara Council, [facility] [address], [place], [place] [address] [phone]

## 2016-02-26 LAB — BUN+CREAT
BUN / CREAT RATIO: 18 (ref 10–24)
BUN: 18 mg/dL (ref 8–27)
Creatinine, Ser: 1.01 mg/dL (ref 0.76–1.27)
GFR, EST AFRICAN AMERICAN: 91 mL/min/{1.73_m2} (ref >59)
GFR, EST NON AFRICAN AMERICAN: 79 mL/min/{1.73_m2} (ref >59)

## 2016-02-28 ENCOUNTER — Ambulatory Visit: Payer: Medicare Other | Admitting: Pain Medicine

## 2016-03-05 ENCOUNTER — Telehealth: Payer: Self-pay | Admitting: Radiology

## 2016-03-05 ENCOUNTER — Ambulatory Visit
Admission: RE | Admit: 2016-03-05 | Discharge: 2016-03-05 | Disposition: A | Discharge status: Home / Self Care | Payer: Medicare Other | Source: Ambulatory Visit | Attending: Urology | Admitting: Urology

## 2016-03-05 DIAGNOSIS — I700 Atherosclerosis of aorta: Secondary | ICD-10-CM | POA: Insufficient documentation

## 2016-03-05 DIAGNOSIS — Z85820 Personal history of malignant melanoma of skin: Secondary | ICD-10-CM | POA: Diagnosis not present

## 2016-03-05 DIAGNOSIS — N202 Calculus of kidney with calculus of ureter: Secondary | ICD-10-CM | POA: Diagnosis not present

## 2016-03-05 DIAGNOSIS — R310 Gross hematuria: Secondary | ICD-10-CM | POA: Insufficient documentation

## 2016-03-05 DIAGNOSIS — Q638 Other specified congenital malformations of kidney: Secondary | ICD-10-CM | POA: Diagnosis not present

## 2016-03-05 DIAGNOSIS — N289 Disorder of kidney and ureter, unspecified: Secondary | ICD-10-CM | POA: Diagnosis not present

## 2016-03-05 MED ORDER — IOPAMIDOL (ISOVUE-300) INJECTION 61%
150.0000 mL | Freq: Once | INTRAVENOUS | Status: AC | PRN
  Administered 2016-03-05: 125 mL via INTRAVENOUS

## 2016-03-05 NOTE — Telephone Encounter (Signed)
Pt requests a call back regarding CT results. He is scheduled for cysto & CT results appt on 03/21/16 with Dr Pilar Jarvis. May call pt at either number: Home: [phone] Mobile: [phone]

## 2016-03-05 NOTE — Telephone Encounter (Signed)
Spoke with patient concerning his CT results.  I have advised the patient that he will need more studies.  He needs to keep his cystoscopy appointment.

## 2016-03-07 ENCOUNTER — Ambulatory Visit: Payer: Medicare Other | Admitting: Urology

## 2016-03-19 ENCOUNTER — Encounter: Payer: Self-pay | Admitting: Pain Medicine

## 2016-03-19 ENCOUNTER — Ambulatory Visit: Payer: Medicare Other | Attending: Pain Medicine | Admitting: Pain Medicine

## 2016-03-19 VITALS — BP 135/65 | HR 42 | Temp 98.1°F | Resp 16 | Ht 69.0 in | Wt 172.0 lb

## 2016-03-19 DIAGNOSIS — M51369 Other intervertebral disc degeneration, lumbar region without mention of lumbar back pain or lower extremity pain: Secondary | ICD-10-CM

## 2016-03-19 DIAGNOSIS — M47816 Spondylosis without myelopathy or radiculopathy, lumbar region: Secondary | ICD-10-CM

## 2016-03-19 DIAGNOSIS — M5126 Other intervertebral disc displacement, lumbar region: Secondary | ICD-10-CM | POA: Insufficient documentation

## 2016-03-19 DIAGNOSIS — M170 Bilateral primary osteoarthritis of knee: Secondary | ICD-10-CM

## 2016-03-19 DIAGNOSIS — G43909 Migraine, unspecified, not intractable, without status migrainosus: Secondary | ICD-10-CM | POA: Diagnosis not present

## 2016-03-19 DIAGNOSIS — M1712 Unilateral primary osteoarthritis, left knee: Secondary | ICD-10-CM

## 2016-03-19 DIAGNOSIS — M545 Low back pain: Secondary | ICD-10-CM | POA: Diagnosis present

## 2016-03-19 DIAGNOSIS — M5481 Occipital neuralgia: Secondary | ICD-10-CM | POA: Insufficient documentation

## 2016-03-19 DIAGNOSIS — M5136 Other intervertebral disc degeneration, lumbar region: Secondary | ICD-10-CM | POA: Diagnosis not present

## 2016-03-19 DIAGNOSIS — M179 Osteoarthritis of knee, unspecified: Secondary | ICD-10-CM | POA: Diagnosis not present

## 2016-03-19 DIAGNOSIS — M5416 Radiculopathy, lumbar region: Secondary | ICD-10-CM

## 2016-03-19 DIAGNOSIS — M533 Sacrococcygeal disorders, not elsewhere classified: Secondary | ICD-10-CM

## 2016-03-19 DIAGNOSIS — M79606 Pain in leg, unspecified: Secondary | ICD-10-CM | POA: Diagnosis present

## 2016-03-19 MED ORDER — GABAPENTIN 100 MG PO CAPS: ORAL_CAPSULE | ORAL | Status: DC

## 2016-03-19 MED ORDER — HYDROCODONE-ACETAMINOPHEN 7.5-325 MG PO TABS: ORAL_TABLET | ORAL | Status: DC

## 2016-03-19 MED ORDER — FENTANYL 50 MCG/HR TD PT72: MEDICATED_PATCH | TRANSDERMAL | Status: DC

## 2016-03-19 NOTE — Progress Notes (Signed)
Subjective:    Patient ID: Zalyn Sannicolas, male    DOB: 1950-09-13, 65 y.o.   MRN: [id number]  HPI  The patient is a 65 year old gentleman who returns to pain management for further evaluation and treatment of pain involving the lower back and lower extremity region predominantly the patient has had pain involving the knees as well. The patient will undergo further evaluation of his general medical condition. There is concern regarding underlying carcinoma which may be contributing to some of patient's symptoms. There've been concern regarding patient's renal function.. The patient and his wife report that there has been some concern regarding masses involving the abdominal region with concern regarding a latency. We discussed patient's condition at the present time we will continue presently prescribed medications. The patient will call pain management prior to scheduled return appointment should there be significant change in condition.  Review of Systems     Objective:   Physical Exam  There was tenderness of the splenius capitis and occipitalis region palpation which reproduces minimal discomfort. There was minimal tenderness of the cervical and thoracic facet region. Palpation of the acromioclavicular and glenohumeral joint regions were without increased pain of significant degree. The patient appeared to be with bilaterally equal grip strength and was with out increase of pain with Tinel and Phalen's maneuver. There was unremarkable Spurling's maneuver and patient was able to perform drop test without difficulty. Palpation of the thoracic region was without crepitus of the thoracic region. Palpation over the lumbar region was attends to palpation of mild degree with lateral bending rotation extension and palpation over the lumbar facets reproducing minimal discomfort. Straight leg raise was tolerated to 30 without an increase of pain with dorsiflexion noted. The knees were with tenderness to  palpation in crepitus of the knees with negative anterior and posterior drawer signs without ballottement of the patella. There was no increased warmth and erythema in the region of the knees. EHL strength appeared to be slightly decreased. No sensory deficit of dermatomal distribution detected. Palpation over the PSIS and PII S region reproduces moderate discomfort. There was mild tenderness of the greater trochanteric region iliotibial band region. There was negative clonus negative Homans The abdomen was without excessive tends to palpation and no costovertebral tenderness was noted.      Assessment & Plan:     Bilateral occipital neuralgia  Migraine headache  Degenerative disc disease lumbar spine  L1-L2 bulging disks, multilevel degenerative changes throughout the lumbar spine  Lumbar facet syndrome  Sacroiliac joint dysfunction  Degenerative joint disease of knee      PLAN   Continue present medication Neurontin hydrocodone acetaminophen and fentanyl patch    NO CELEBREX CAUTION Neurontin is eliminated by the kidney. Please be aware of potential for accumulation of Neurontin which can cause drowsiness confusion respiratory depression and other side effects. Please discussed the use of Neurontin with Dr. Ouida Sills and with your kidney physician (nephrologist)  F/U PCP Dr. Ouida Sills for evaliation of  BP renal condition and general medical condition  F/U with kidney physician (nephrologist) for evaluation as discussed and as per Dr. Ouida Sills  F/U surgical evaluation. May consider pending follow-up evaluations  F/U with dermatologist Dr. Phillip Heal as discussed  F/U neurological evaluation. May consider PNCV/EMG studies and other studies pending follow-up evaluations  May consider radiofrequency rhizolysis or intraspinal procedures pending response to present treatment and F/U evaluation . We will avoid such procedures at this time   Patient to call [facility] Management Center  should patient  have concerns prior to scheduled return appointment.

## 2016-03-19 NOTE — Progress Notes (Signed)
Safety precautions to be maintained throughout the outpatient stay will include: orient to surroundings, keep bed in low position, maintain call bell within reach at all times, provide assistance with transfer out of bed and ambulation.  

## 2016-03-19 NOTE — Patient Instructions (Addendum)
PLAN   Continue present medication Neurontin hydrocodone acetaminophen and fentanyl patch    NO CELEBREX CAUTION Neurontin is eliminated by the kidney. Please be aware of potential for accumulation of Neurontin which can cause drowsiness confusion respiratory depression and other side effects. Please discussed the use of Neurontin with Dr. Ouida Sills and with your kidney physician (nephrologist)  F/U PCP Dr. Ouida Sills for evaliation of  BP renal condition and general medical condition  F/U with kidney physician (nephrologist) for evaluation as discussed and as per Dr. Ouida Sills  F/U surgical evaluation. May consider pending follow-up evaluations  F/U with dermatologist Dr. Phillip Heal as discussed  F/U neurological evaluation. May consider PNCV/EMG studies and other studies pending follow-up evaluations  May consider radiofrequency rhizolysis or intraspinal procedures pending response to present treatment and F/U evaluation . We will avoid such procedures at this time   Patient to call Pain Management Center should patient have concerns prior to scheduled return appointment.

## 2016-03-21 ENCOUNTER — Encounter: Payer: Self-pay | Admitting: Urology

## 2016-03-21 ENCOUNTER — Ambulatory Visit (INDEPENDENT_AMBULATORY_CARE_PROVIDER_SITE_OTHER): Payer: Medicare Other | Admitting: Urology

## 2016-03-21 VITALS — BP 130/75 | HR 46 | Ht 69.0 in | Wt 175.1 lb

## 2016-03-21 DIAGNOSIS — N289 Disorder of kidney and ureter, unspecified: Secondary | ICD-10-CM | POA: Diagnosis not present

## 2016-03-21 DIAGNOSIS — G894 Chronic pain syndrome: Secondary | ICD-10-CM | POA: Insufficient documentation

## 2016-03-21 DIAGNOSIS — Q5310 Unspecified undescended testicle, unilateral: Secondary | ICD-10-CM

## 2016-03-21 DIAGNOSIS — M9979 Connective tissue and disc stenosis of intervertebral foramina of abdomen and other regions: Secondary | ICD-10-CM | POA: Insufficient documentation

## 2016-03-21 DIAGNOSIS — G43909 Migraine, unspecified, not intractable, without status migrainosus: Secondary | ICD-10-CM | POA: Insufficient documentation

## 2016-03-21 DIAGNOSIS — R310 Gross hematuria: Secondary | ICD-10-CM | POA: Diagnosis not present

## 2016-03-21 DIAGNOSIS — M1250 Traumatic arthropathy, unspecified site: Secondary | ICD-10-CM | POA: Insufficient documentation

## 2016-03-21 DIAGNOSIS — N200 Calculus of kidney: Secondary | ICD-10-CM | POA: Diagnosis not present

## 2016-03-21 DIAGNOSIS — I10 Essential (primary) hypertension: Secondary | ICD-10-CM | POA: Insufficient documentation

## 2016-03-21 DIAGNOSIS — R911 Solitary pulmonary nodule: Secondary | ICD-10-CM | POA: Diagnosis not present

## 2016-03-21 DIAGNOSIS — Z125 Encounter for screening for malignant neoplasm of prostate: Secondary | ICD-10-CM | POA: Diagnosis not present

## 2016-03-21 LAB — URINALYSIS, COMPLETE
Bilirubin, UA: NEGATIVE
GLUCOSE, UA: NEGATIVE
KETONES UA: NEGATIVE
LEUKOCYTES UA: NEGATIVE
NITRITE UA: NEGATIVE
Protein, UA: NEGATIVE
Urobilinogen, Ur: 0.2 mg/dL (ref 0.2–1.0)
pH, UA: 5 (ref 5.0–7.5)

## 2016-03-21 LAB — MICROSCOPIC EXAMINATION

## 2016-03-21 MED ORDER — KETOROLAC TROMETHAMINE 10 MG PO TABS: 10.0000 mg | ORAL_TABLET | Freq: Four times a day (QID) | ORAL | Status: DC | PRN

## 2016-03-21 MED ORDER — LIDOCAINE HCL 2 % EX GEL
1.0000 "application " | Freq: Once | CUTANEOUS | Status: AC
  Administered 2016-03-21: 1 via URETHRAL

## 2016-03-21 MED ORDER — CIPROFLOXACIN HCL 500 MG PO TABS
500.0000 mg | ORAL_TABLET | Freq: Once | ORAL | Status: AC
  Administered 2016-03-21: 500 mg via ORAL

## 2016-03-21 NOTE — Progress Notes (Signed)
03/21/2016 9:12 AM   Tanner Baker 07-29-1951 [id number]  Referring provider: Kirk Ruths, MD [facility] [address] [place], [place] [address]  Chief Complaint  Patient presents with  . Cysto    HPI: The patient is an 65 year old gentleman with a history of nephrolithiasis and gross hematuria presents today for completion of his gross hematuria workup.    CT had numerous incidental findings. He has a 3 mm stone in the right side was nonobstructing. He also passed a distal left ureteral stone since the previous CT scan. There is a right renal lesion with minimal complexity were surveillance ultrasound was MRI is recommended. He also has a right base lung nodule in the setting of history of melanoma were repeat CT of the chest was recommended in 6 months.  PMH: Past Medical History  Diagnosis Date  . Hypertension   . Cancer ([place])     skin cancer  . Kidney stone   . Sleep apnea   . Migraine   . Chronic pain syndrome   . Traumatic arthritis   . Depression   . DDD (degenerative disc disease), cervical   . Migraine   . Post-traumatic headache   . Heartburn   . Anxiety   . Depression   . Glaucoma   . Kidney stones     Surgical History: Past Surgical History  Procedure Laterality Date  . Wisdom tooth extraction    . Tonsillectomy    . Arm surg Right 1995  . Cosmetic surgery  1994    all of teeth  . Skin cancer excision      Home Medications:    Medication List       This list is accurate as of: 03/21/16  9:12 AM.  Always use your most recent med list.               busPIRone 15 MG tablet  Commonly known as:  BUSPAR  Take 15 mg by mouth 3 (three) times daily.     fentaNYL 50 MCG/HR  Commonly known as:  DURAGESIC - dosed mcg/hr  Applied 1 patch to skin every 3 days if tolerated     FLONASE 50 MCG/ACT nasal spray  Generic drug:  fluticasone  Place 1 spray into both nostrils daily as needed. Reported on 03/21/2016     gabapentin 100 MG capsule  Commonly known as:  NEURONTIN  Limit 2 - 3 tabs bid to tid if tolerated     hydrochlorothiazide 25 MG tablet  Commonly known as:  HYDRODIURIL  Take 25 mg by mouth daily.     HYDROcodone-acetaminophen 7.5-325 MG tablet  Commonly known as:  NORCO  Limit 1 tablet daily, bid to tid for breakthrough pain if needed and if tolerated while wearing fentanyl patch     ketorolac 10 MG tablet  Commonly known as:  TORADOL  Take 1 tablet (10 mg total) by mouth every 8 (eight) hours as needed.     ketorolac 10 MG tablet  Commonly known as:  TORADOL  Take 1 tablet (10 mg total) by mouth every 6 (six) hours as needed.     latanoprost 0.005 % ophthalmic solution  Commonly known as:  XALATAN  Place 2 drops into both eyes at bedtime.     ondansetron 4 MG tablet  Commonly known as:  ZOFRAN  Take 1 tablet (4 mg total) by mouth every 8 (eight) hours as needed for nausea or vomiting.  PARoxetine 20 MG tablet  Commonly known as:  PAXIL  Take 20 mg by mouth daily. Reported on 03/21/2016     polyethylene glycol powder powder  Commonly known as:  GLYCOLAX/MIRALAX  Take by mouth daily.     propranolol 20 MG tablet  Commonly known as:  INDERAL  Take 60 mg by mouth 2 (two) times daily.     ranitidine 150 MG tablet  Commonly known as:  ZANTAC  Take 150 mg by mouth 2 (two) times daily.     SUMAtriptan 100 MG tablet  Commonly known as:  IMITREX  Take 100 mg by mouth once. May repeat in 2 hours if headache persists or recurs.     tamsulosin 0.4 MG Caps capsule  Commonly known as:  FLOMAX  Take 1 capsule (0.4 mg total) by mouth daily.     traZODone 50 MG tablet  Commonly known as:  DESYREL  Take 100 mg by mouth at bedtime as needed for sleep. Take 1-2 tablets at bedtime as needed     VITAMIN D-3 PO  Take 1,000 Int'l Units/day by mouth every morning. Reported on 01/01/2016        Allergies:  Allergies  Allergen Reactions  . Doxycycline Rash    Family  History: Family History  Problem Relation Age of Onset  . Cancer Mother     melanoma  . Diabetes Father   . Heart disease Father   . Cancer Father     melanoma  . Lung cancer Mother   . Kidney disease Neg Hx   . Prostate cancer Paternal Uncle   . Kidney Stones Father     Social History:  reports that he has quit smoking. He has never used smokeless tobacco. He reports that he does not drink alcohol or use illicit drugs.  ROS:                                        Physical Exam: BP 130/75 mmHg  Pulse 46  Ht 5\' 9"  (1.753 m)  Wt 175 lb 1.6 oz (79.425 kg)  BMI 25.85 kg/m2  Constitutional:  Alert and oriented, No acute distress. HEENT: [place] AT, moist mucus membranes.  Trachea midline, no masses. Cardiovascular: No clubbing, cyanosis, or edema. Respiratory: Normal respiratory effort, no increased work of breathing. GI: Abdomen is soft, nontender, nondistended, no abdominal masses GU: No CVA tenderness. Left testicle is located in the upper portion of the left hemiscrotum. It is able to be brought down into the left scrotum and has no masses. Right testicle is unremarkable. Skin: No rashes, bruises or suspicious lesions. Lymph: No cervical or inguinal adenopathy. Neurologic: Grossly intact, no focal deficits, moving all 4 extremities. Psychiatric: Normal mood and affect.  Laboratory Data: Lab Results  Component Value Date   WBC 7.8 11/17/2015   HGB 14.6 11/17/2015   HCT 41.7 11/17/2015   MCV 90.6 11/17/2015   PLT 235 11/17/2015    Lab Results  Component Value Date   CREATININE 1.01 02/25/2016    No results found for: PSA  No results found for: TESTOSTERONE  No results found for: HGBA1C  Urinalysis    Component Value Date/Time   COLORURINE AMBER* 11/17/2015 0720   APPEARANCEUR Clear 02/04/2016 1117   APPEARANCEUR CLEAR* 11/17/2015 0720   LABSPEC 1.019 11/17/2015 0720   PHURINE 7.0 11/17/2015 0720   GLUCOSEU Negative 02/04/2016 1117  HGBUR NEGATIVE 11/17/2015 0720   BILIRUBINUR Negative 02/04/2016 1117   BILIRUBINUR NEGATIVE 11/17/2015 0720   KETONESUR 1+* 11/17/2015 0720   PROTEINUR Negative 02/04/2016 1117   PROTEINUR 30* 11/17/2015 0720   NITRITE Negative 02/04/2016 1117   NITRITE POSITIVE* 11/17/2015 0720   LEUKOCYTESUR Negative 02/04/2016 1117   LEUKOCYTESUR NEGATIVE 11/17/2015 0720    Pertinent Imaging:  CLINICAL DATA: History kidney stones. Gross hematuria and hemoptysis in March. History of melanoma.  EXAM: CT ABDOMEN AND PELVIS WITHOUT AND WITH CONTRAST  TECHNIQUE: Multidetector CT imaging of the abdomen and pelvis was performed following the standard protocol before and following the bolus administration of intravenous contrast.  CONTRAST: 186mL ISOVUE-300 IOPAMIDOL (ISOVUE-300) INJECTION 61%  COMPARISON: 02/15/2016 renal ultrasound. 11/17/2015 stone study.  FINDINGS: Lower chest: 3 mm right middle lobe pulmonary nodule on image 4/ series 6 is not significantly changed. Normal heart size without pericardial or pleural effusion.  Hepatobiliary: Focal steatosis adjacent the falciform ligament. Normal gallbladder, without biliary ductal dilatation.  Pancreas: Mild pancreatic atrophy, without duct dilatation or mass.  Spleen: Normal in size, without focal abnormality.  Adrenals/Urinary Tract: Normal adrenal glands. 3 mm right renal collecting system calculus. Resolution of left-sided hydroureteronephrosis with passage of distal left ureteric stone. No bladder calculi.  14 mm interpolar right renal lesion is fluid density, including image 23/series 4. This suggestion of subtle complexity within, including on transverse image 23/series 4 and image 19/series 604 coronal.  Lower pole left renal lesion measures 2.2 cm and 11 HU, consistent with a cyst. There are too small to characterize lesions in the left kidney, which are most likely cysts.  Moderate renal collecting  system and good ureteric opacification, without filling defect. 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 Normal insertion of both left ureters.  No enhancing bladder mass. Non-opacified urine identified within the posterior bladder. Given this factor, no bladder filling defect seen. No correlate for the ultrasound abnormality which was likely artifactual.  Stomach/Bowel: Normal stomach, without wall thickening. Colonic stool burden suggests constipation. Normal small bowel. Normal terminal ileum and appendix.  Vascular/Lymphatic: Aortic and branch vessel atherosclerosis. No abdominopelvic adenopathy.  Reproductive: Normal prostate. Probable nondistended left testicle, as before. Tiny right head still.  Other: No significant free fluid.  Musculoskeletal: Bilateral L5 pars defects. Trace L4-5 anterolisthesis. Mild superior endplate compression deformity at L1 is similar.  IMPRESSION: 1. Interval passage of obstructive distal left ureteric stone. Nonobstructive right nephrolithiasis remains. 2. No correlate for the ultrasound abnormality within the bladder. 3. A right renal lesion which is fluid density but may have minimal complexity Posteriorly. Potential clinical strategies include definitive characterization with pre and post contrast abdominal MRI versus ultrasound surveillance in 6 months to confirm size stability. 4. Right lung base nodule. Given the history of melanoma, this should be considered for follow-up with chest CT at approximately 6 months. 5. Nondistended left testicle. 6. Aortic atherosclerosis. 7. Duplicated left renal collecting system.     Cystoscopy Procedure Note  Patient identification was confirmed, informed consent was obtained, and patient was prepped using Betadine solution.  Lidocaine jelly was administered per urethral meatus.    Preoperative abx where received prior to procedure.     Pre-Procedure: - Inspection reveals a  normal caliber ureteral meatus.  Procedure: The flexible cystoscope was introduced without difficulty - No urethral strictures/lesions are present. - Enlarged prostate  - Normal bladder neck - Bilateral ureteral orifices identified - Bladder mucosa  reveals no ulcers, tumors, or lesions - No bladder stones -  No trabeculation  Retroflexion shows no intravesical lobe   Post-Procedure: - Patient tolerated the procedure well     Assessment & Plan:    1. Right nonobstructing renal stone Stone is small so no intervention required at this time. The patient has requested a prescription for Toradol in the event that he starts passing the stone. A prescription for 5 days of Toradol was called into his pharmacy.  2. Right renal lesion The patient will either need an MRI versus renal ultrasound per radiology report. Based on my review of the images, this is more than likely benign. I think most reasonable option would be to obtain a renal ultrasound in 6 months for surveillance.  3. Gross hematuria Negative except for above  4. Right lung nodule Per radiology recommendations the patient will need a CT scan of his chest in 6 months. I discussed this with the patient as well as a copy of the report. He will follow-up in the need for repeat CT scan of his chest in 6 months with his primary care provider.  5. Left undescended testicle.  No masses palpable on left testes when brought into scrotum. No further work up  6. Prostate cancer screening Patient be due for repeat DRE and PSA in July 2018.   Return in about 6 months (around 09/21/2016) for with renal u/s prior.  Nickie Retort, MD  [facility] [address] [place],  [address] [phone]

## 2016-04-16 ENCOUNTER — Encounter: Payer: Self-pay | Admitting: Pain Medicine

## 2016-04-16 ENCOUNTER — Ambulatory Visit: Payer: Medicare Other | Attending: Pain Medicine | Admitting: Pain Medicine

## 2016-04-16 VITALS — BP 131/71 | HR 47 | Temp 98.0°F | Resp 14 | Ht 69.0 in | Wt 172.0 lb

## 2016-04-16 DIAGNOSIS — M179 Osteoarthritis of knee, unspecified: Secondary | ICD-10-CM | POA: Diagnosis not present

## 2016-04-16 DIAGNOSIS — M170 Bilateral primary osteoarthritis of knee: Secondary | ICD-10-CM

## 2016-04-16 DIAGNOSIS — M47816 Spondylosis without myelopathy or radiculopathy, lumbar region: Secondary | ICD-10-CM | POA: Diagnosis not present

## 2016-04-16 DIAGNOSIS — M533 Sacrococcygeal disorders, not elsewhere classified: Secondary | ICD-10-CM | POA: Diagnosis not present

## 2016-04-16 DIAGNOSIS — M5136 Other intervertebral disc degeneration, lumbar region: Secondary | ICD-10-CM | POA: Diagnosis not present

## 2016-04-16 DIAGNOSIS — G43909 Migraine, unspecified, not intractable, without status migrainosus: Secondary | ICD-10-CM | POA: Diagnosis not present

## 2016-04-16 DIAGNOSIS — M62830 Muscle spasm of back: Secondary | ICD-10-CM | POA: Insufficient documentation

## 2016-04-16 DIAGNOSIS — M5126 Other intervertebral disc displacement, lumbar region: Secondary | ICD-10-CM | POA: Insufficient documentation

## 2016-04-16 DIAGNOSIS — M5481 Occipital neuralgia: Secondary | ICD-10-CM | POA: Insufficient documentation

## 2016-04-16 DIAGNOSIS — M545 Low back pain: Secondary | ICD-10-CM | POA: Diagnosis present

## 2016-04-16 DIAGNOSIS — M79606 Pain in leg, unspecified: Secondary | ICD-10-CM | POA: Diagnosis present

## 2016-04-16 DIAGNOSIS — M5416 Radiculopathy, lumbar region: Secondary | ICD-10-CM

## 2016-04-16 MED ORDER — FENTANYL 50 MCG/HR TD PT72: MEDICATED_PATCH | TRANSDERMAL | 0 refills | Status: DC

## 2016-04-16 MED ORDER — HYDROCODONE-ACETAMINOPHEN 7.5-325 MG PO TABS: ORAL_TABLET | ORAL | 0 refills | Status: DC

## 2016-04-16 MED ORDER — GABAPENTIN 100 MG PO CAPS: ORAL_CAPSULE | ORAL | 0 refills | Status: DC

## 2016-04-16 NOTE — Progress Notes (Signed)
     The patient is a 65 year old gentleman who returns to pain management for further evaluation and treatment of pain involving the lower back lower extremity region predominantly with pain which involves the knees especially the left knee as well. Patient states the pain is fairly well-controlled present treatment regimen. Patient denies any trauma change in events of daily living the call significant change in symptomatology the patient is undergone oncological evaluation without evidence of malignancy. The patient is quite relieved to know that the findings were all negative for malignancy. We will continue present medications including Neurontin hydrocodone acetaminophen and fentanyl patch. The patient was with understanding and agreement suggested treatment plan.    Physical examination   There was tenderness of the splenius capitis and occipitalis region a mild degree with mild tenderness over the cervical facet and thoracic facet region. Palpation of the acromioclavicular and glenohumeral joint regions reproduced pain of mild degree and patient may perform drop test with minimal difficulty. Palpation over the thoracic region with no crepitus of the thoracic region. There was mild muscle spasm of the lower thoracic paraspinal musculature region noted. Palpation over the lumbar facet region lumbar paraspinal musculature region was attends to palpation of mild to moderate degree with lateral bending rotation extension and palpation over the lumbar facets reproducing mild to moderate discomfort. Palpation of the greater trochanteric region iliotibial band region was with mild tenderness to palpation as well as mild tenderness to palpation was noted in the region of the PSIS and PII S regions. The knee was with minimal tenderness to palpation with range of motion maneuvers of the knee with negative anterior and posterior drawer signs without ballottement of the patella. No increased warmth erythema  noted in the region of the knee. There was crepitus of the knees noted. Straight leg raising was tolerates approximately 30 without a definite increased pain with dorsiflexion noted. There was no sensory deficit or dermatomal distribution detected. There was negative clonus negative Homans. Abdomen was nontender with no costovertebral tenderness noted    Assessment   Bilateral occipital neuralgia  Migraine headache  Degenerative disc disease lumbar spine  L1-L2 bulging disks, multilevel degenerative changes throughout the lumbar spine  Lumbar facet syndrome  Sacroiliac joint dysfunction  Degenerative joint disease of knee    PLAN   Continue present medications   Neurontin hydrocodone acetaminophen and fentanyl patch    NO CELEBREX CAUTION Neurontin is eliminated by the kidney. Please be aware of potential for accumulation of Neurontin which can cause drowsiness confusion respiratory depression and other side effects. Follow up with Dr. Ouida Sills and with your kidney physician (nephrologist) regarding the use of Neurontin as previously discussed  F/U PCP Dr. Ouida Sills for evaliation of  BP renal condition and general medical condition  F/U with kidney physician (nephrologist) for evaluation as discussed and as per Dr. Ouida Sills  F/U surgical evaluation. May consider pending follow-up evaluations  F/U with dermatologist Dr. Phillip Heal  F/U neurological evaluation. May consider PNCV/EMG studies and other studies pending follow-up evaluations  May consider radiofrequency rhizolysis or intraspinal procedures pending response to present treatment and F/U evaluation . We will avoid such procedures at this time   Patient to call Pain Management Center should patient have concerns prior to scheduled return appointment.

## 2016-04-16 NOTE — Patient Instructions (Signed)
PLAN   Continue present medications   Neurontin hydrocodone acetaminophen and fentanyl patch    NO CELEBREX CAUTION Neurontin is eliminated by the kidney. Please be aware of potential for accumulation of Neurontin which can cause drowsiness confusion respiratory depression and other side effects. Follow up with Dr. Ouida Sills and with your kidney physician (nephrologist) regarding the use of Neurontin as previously discussed  F/U PCP Dr. Ouida Sills for evaliation of  BP renal condition and general medical condition  F/U with kidney physician (nephrologist) for evaluation as discussed and as per Dr. Ouida Sills  F/U surgical evaluation. May consider pending follow-up evaluations  F/U with dermatologist Dr. Phillip Heal  F/U neurological evaluation. May consider PNCV/EMG studies and other studies pending follow-up evaluations  May consider radiofrequency rhizolysis or intraspinal procedures pending response to present treatment and F/U evaluation . We will avoid such procedures at this time   Patient to call Pain Management Center should patient have concerns prior to scheduled return appointment.

## 2016-04-30 ENCOUNTER — Telehealth: Payer: Self-pay | Admitting: *Deleted

## 2016-05-07 ENCOUNTER — Encounter: Payer: Self-pay | Admitting: Pain Medicine

## 2016-05-07 ENCOUNTER — Ambulatory Visit: Payer: Medicare Other | Attending: Pain Medicine | Admitting: Pain Medicine

## 2016-05-07 VITALS — BP 122/66 | HR 46 | Temp 98.3°F | Resp 14 | Ht 69.0 in | Wt 172.0 lb

## 2016-05-07 DIAGNOSIS — M79605 Pain in left leg: Secondary | ICD-10-CM | POA: Diagnosis present

## 2016-05-07 DIAGNOSIS — M47816 Spondylosis without myelopathy or radiculopathy, lumbar region: Secondary | ICD-10-CM | POA: Diagnosis not present

## 2016-05-07 DIAGNOSIS — M5136 Other intervertebral disc degeneration, lumbar region: Secondary | ICD-10-CM | POA: Diagnosis not present

## 2016-05-07 DIAGNOSIS — M533 Sacrococcygeal disorders, not elsewhere classified: Secondary | ICD-10-CM | POA: Insufficient documentation

## 2016-05-07 DIAGNOSIS — M5416 Radiculopathy, lumbar region: Secondary | ICD-10-CM

## 2016-05-07 DIAGNOSIS — M5481 Occipital neuralgia: Secondary | ICD-10-CM | POA: Insufficient documentation

## 2016-05-07 DIAGNOSIS — M545 Low back pain: Secondary | ICD-10-CM | POA: Diagnosis present

## 2016-05-07 DIAGNOSIS — M170 Bilateral primary osteoarthritis of knee: Secondary | ICD-10-CM

## 2016-05-07 DIAGNOSIS — M79604 Pain in right leg: Secondary | ICD-10-CM | POA: Diagnosis present

## 2016-05-07 DIAGNOSIS — G43909 Migraine, unspecified, not intractable, without status migrainosus: Secondary | ICD-10-CM | POA: Insufficient documentation

## 2016-05-07 DIAGNOSIS — M179 Osteoarthritis of knee, unspecified: Secondary | ICD-10-CM | POA: Diagnosis not present

## 2016-05-07 MED ORDER — FENTANYL 50 MCG/HR TD PT72: MEDICATED_PATCH | TRANSDERMAL | 0 refills | Status: DC

## 2016-05-07 MED ORDER — GABAPENTIN 100 MG PO CAPS: ORAL_CAPSULE | ORAL | 0 refills | Status: DC

## 2016-05-07 MED ORDER — HYDROCODONE-ACETAMINOPHEN 7.5-325 MG PO TABS: ORAL_TABLET | ORAL | 0 refills | Status: DC

## 2016-05-07 NOTE — Progress Notes (Signed)
     The patient is a 65 year old woman who returns to pain management for further evaluation and treatment of pain involving the lower back lower extremity region and knees. At the present time patient with pain fairly well-controlled.. The patient denies headaches of significant pain of the upper mid lower back region and is without complaint of pain of the knees of any significant degree The patient states that he is able to perform activities of daily living without suggestion severely disabling pain. We discussed patient's condition and we will continue medications consisting of Neurontin hydrocodone acetaminophen and fentanyl patch. We remain available to consider modification of treatment pending follow-up evaluation. All agreed to suggested treatment plan.     Physical examination  There was mild tenderness of the splenius capitis and occipitalis region. Palpation of the cervical facet cervical paraspinal musculature region reproduced mild discomfort. There was mild tenderness of the thoracic facet thoracic paraspinal musculature region without crepitus of the thoracic region noted. Palpation of the acromioclavicular and glenohumeral joint region reproduced mild to moderate discomfort. The patient appeared to be with bilaterally equal grip strength without significant increased pain with Tinel and Phalen's maneuver. There was no crepitus of the thoracic region noted. Palpation over the lumbar paraspinal musculature region lumbar facet region was with moderate tenderness to palpation. Palpation of the PSIS and PII S region reproduces minimal discomfort. Straight leg raise was tolerated to 30 without an increase of pain with dorsiflexion noted. Lateral bending and extension and palpation of the lumbar facets reproduced minimal discomfort. There was mild tenderness of the knees with negative anterior and posterior drawer signs without ballottement of the patella. There was no increased warmth  erythema in the region of the knees. There was negative clonus negative Homans. Abdomen nontender with no costovertebral tenderness noted      Assessment   Bilateral occipital neuralgia  Migraine headache  Degenerative disc disease lumbar spine  L1-L2 bulging disks, multilevel degenerative changes throughout the lumbar spine  Lumbar facet syndrome  Sacroiliac joint dysfunction  Degenerative joint disease of knee       PLAN    Continue present medications   Neurontin hydrocodone acetaminophen and fentanyl patch    NO CELEBREX CAUTION Neurontin is eliminated by the kidney. Please be aware of potential for accumulation of Neurontin which can cause drowsiness confusion respiratory depression and other side effects. Follow up with Dr. Ouida Sills and with your kidney physician (nephrologist) regarding the use of Neurontin as previously discussed  F/U PCP Dr. Ouida Sills for evaliation of  BP renal condition and general medical condition as previously discussed  F/U with kidney physician (nephrologist) for evaluation as discussed and as per Dr. Ouida Sills  F/U surgical evaluation. May consider pending follow-up evaluations  F/U with dermatologist Dr. Phillip Heal  F/U neurological evaluation. May consider PNCV/EMG studies and other studies pending follow-up evaluations  May consider radiofrequency rhizolysis or intraspinal procedures pending response to present treatment and F/U evaluation . We will avoid such procedures at this time   Patient to call Pain Management Center should patient have concerns prior to scheduled return appointment.

## 2016-05-07 NOTE — Progress Notes (Signed)
Safety precautions to be maintained throughout the outpatient stay will include: orient to surroundings, keep bed in low position, maintain call bell within reach at all times, provide assistance with transfer out of bed and ambulation.  

## 2016-05-07 NOTE — Patient Instructions (Addendum)
PLAN   Continue present medications   Neurontin hydrocodone acetaminophen and fentanyl patch    NO CELEBREX CAUTION Neurontin is eliminated by the kidney. Please be aware of potential for accumulation of Neurontin which can cause drowsiness confusion respiratory depression and other side effects. Follow up with Dr. Ouida Sills and with your kidney physician (nephrologist) regarding the use of Neurontin as previously discussed  F/U PCP Dr. Ouida Sills for evaliation of  BP renal condition and general medical condition as previously discussed  F/U with kidney physician (nephrologist) for evaluation as discussed and as per Dr. Ouida Sills  F/U surgical evaluation. May consider pending follow-up evaluations  F/U with dermatologist Dr. Phillip Heal  F/U neurological evaluation. May consider PNCV/EMG studies and other studies pending follow-up evaluations  May consider radiofrequency rhizolysis or intraspinal procedures pending response to present treatment and F/U evaluation . We will avoid such procedures at this time   Patient to call Pain Management Center should patient have concerns prior to scheduled return appointment.

## 2016-05-14 ENCOUNTER — Ambulatory Visit: Payer: Medicare Other | Admitting: Pain Medicine

## 2016-06-06 ENCOUNTER — Other Ambulatory Visit: Payer: Self-pay | Admitting: Pain Medicine

## 2016-09-15 ENCOUNTER — Ambulatory Visit
Admission: RE | Admit: 2016-09-15 | Discharge: 2016-09-15 | Disposition: A | Discharge status: Home / Self Care | Payer: Medicare Other | Source: Ambulatory Visit | Attending: Urology | Admitting: Urology

## 2016-09-15 DIAGNOSIS — N3289 Other specified disorders of bladder: Secondary | ICD-10-CM | POA: Insufficient documentation

## 2016-09-15 DIAGNOSIS — N289 Disorder of kidney and ureter, unspecified: Secondary | ICD-10-CM

## 2016-09-15 DIAGNOSIS — N200 Calculus of kidney: Secondary | ICD-10-CM | POA: Diagnosis not present

## 2016-09-15 DIAGNOSIS — N281 Cyst of kidney, acquired: Secondary | ICD-10-CM | POA: Diagnosis present

## 2016-09-18 ENCOUNTER — Ambulatory Visit: Payer: Medicare Other

## 2016-09-19 ENCOUNTER — Ambulatory Visit: Payer: Medicare Other

## 2016-10-02 ENCOUNTER — Encounter: Payer: Self-pay | Admitting: Urology

## 2016-10-02 ENCOUNTER — Ambulatory Visit: Payer: Medicare Other | Admitting: Urology

## 2016-10-02 VITALS — BP 115/69 | HR 59 | Ht 69.0 in | Wt 193.0 lb

## 2016-10-02 DIAGNOSIS — N400 Enlarged prostate without lower urinary tract symptoms: Secondary | ICD-10-CM

## 2016-10-02 DIAGNOSIS — N281 Cyst of kidney, acquired: Secondary | ICD-10-CM | POA: Diagnosis not present

## 2016-10-02 DIAGNOSIS — N200 Calculus of kidney: Secondary | ICD-10-CM

## 2016-10-02 DIAGNOSIS — N329 Bladder disorder, unspecified: Secondary | ICD-10-CM | POA: Diagnosis not present

## 2016-10-02 MED ORDER — LIDOCAINE HCL 2 % EX GEL
1.0000 "application " | Freq: Once | CUTANEOUS | Status: AC
  Administered 2016-10-02: 1 via URETHRAL

## 2016-10-02 MED ORDER — CIPROFLOXACIN HCL 500 MG PO TABS
500.0000 mg | ORAL_TABLET | Freq: Once | ORAL | Status: AC
  Administered 2016-10-02: 500 mg via ORAL

## 2016-10-02 NOTE — Progress Notes (Signed)
10/02/2016 11:01 AM   Tanner Baker 12-21-1950 [id number]  Referring provider: Kirk Ruths, MD [facility] [place], [place] [address]  Chief Complaint  Patient presents with  . Follow-up    Renal US results    HPI: The patient is a 66 year old gentleman presents today for follow-up.  1. Right renal lesion   The patient had a right renal lesion found during a hematuria workup. Follow-up renal ultrasound at this time shows this to be a simple cyst with no evidence of malignancy.  2. History of gross hematuria Workup in July 2017 revealed a 3 mm right renal stone. He also passed a distal left ureteral stone that was seen on the CT. He had a right renal lesion as discussed above. Cystoscopy was negative.  3. Right lateral bladder wall mass This was seen in January 2018 renal ultrasound. He did have cystoscopy 6 months ago that did not really reveal any tumor.   PMH: Past Medical History:  Diagnosis Date  . Anxiety   . Cancer ([place])    skin cancer  . Chronic pain syndrome   . DDD (degenerative disc disease), cervical   . Depression   . Depression   . Glaucoma   . Heartburn   . Hypertension   . Kidney stone   . Kidney stones   . Migraine   . Migraine   . Post-traumatic headache   . Sleep apnea   . Traumatic arthritis     Surgical History: Past Surgical History:  Procedure Laterality Date  . arm surg Right 1995  . COSMETIC SURGERY  1994   all of teeth  . SKIN CANCER EXCISION    . TONSILLECTOMY    . WISDOM TOOTH EXTRACTION      Home Medications:  Allergies as of 10/02/2016      Reactions   Doxycycline Rash      Medication List       Accurate as of 10/02/16 11:01 AM. Always use your most recent med list.          busPIRone 15 MG tablet Commonly known as:  BUSPAR Take 15 mg by mouth 3 (three) times daily.   fentaNYL 50 MCG/HR Commonly known as:  DURAGESIC - dosed mcg/hr Applied 1 patch to skin every 3 days if  tolerated   FLONASE 50 MCG/ACT nasal spray Generic drug:  fluticasone Place 1 spray into both nostrils daily as needed. Reported on 03/21/2016   gabapentin 100 MG capsule Commonly known as:  NEURONTIN Limit 2 - 3 tabs bid to tid if tolerated   hydrochlorothiazide 25 MG tablet Commonly known as:  HYDRODIURIL Take 25 mg by mouth daily.   HYDROcodone-acetaminophen 7.5-325 MG tablet Commonly known as:  NORCO Limit 1 tablet daily, bid to tid for breakthrough pain if needed and if tolerated while wearing fentanyl patch   ketorolac 10 MG tablet Commonly known as:  TORADOL Take 1 tablet (10 mg total) by mouth every 8 (eight) hours as needed.   latanoprost 0.005 % ophthalmic solution Commonly known as:  XALATAN Place 2 drops into both eyes at bedtime.   ondansetron 4 MG tablet Commonly known as:  ZOFRAN Take 1 tablet (4 mg total) by mouth every 8 (eight) hours as needed for nausea or vomiting.   PARoxetine 20 MG tablet Commonly known as:  PAXIL Take 20 mg by mouth daily. Reported on 03/21/2016   polyethylene glycol powder powder Commonly known as:  GLYCOLAX/MIRALAX Take by  mouth daily.   propranolol 20 MG tablet Commonly known as:  INDERAL Take 60 mg by mouth 2 (two) times daily.   ranitidine 150 MG tablet Commonly known as:  ZANTAC Take 150 mg by mouth 2 (two) times daily.   SUMAtriptan 100 MG tablet Commonly known as:  IMITREX Take 100 mg by mouth once. May repeat in 2 hours if headache persists or recurs.   tamsulosin 0.4 MG Caps capsule Commonly known as:  FLOMAX Take 1 capsule (0.4 mg total) by mouth daily.   traZODone 50 MG tablet Commonly known as:  DESYREL Take 100 mg by mouth at bedtime as needed for sleep. Take 1-2 tablets at bedtime as needed   vitamin C 100 MG tablet Take 100 mg by mouth daily.   VITAMIN D-3 PO Take 1,000 Int'l Units/day by mouth every morning. Reported on 01/01/2016   vitamin E 1000 UNIT capsule Take 1,000 Units by mouth daily.        Allergies:  Allergies  Allergen Reactions  . Doxycycline Rash    Family History: Family History  Problem Relation Age of Onset  . Cancer Mother     melanoma  . Diabetes Father   . Heart disease Father   . Cancer Father     melanoma  . Lung cancer Mother   . Kidney disease Neg Hx   . Prostate cancer Paternal Uncle   . Kidney Stones Father     Social History:  reports that he has quit smoking. He has never used smokeless tobacco. He reports that he does not drink alcohol or use drugs.  ROS: UROLOGY Frequent Urination?: No Hard to postpone urination?: No Burning/pain with urination?: No Get up at night to urinate?: No Leakage of urine?: No Urine stream starts and stops?: No Trouble starting stream?: No Do you have to strain to urinate?: No Blood in urine?: No Urinary tract infection?: No Sexually transmitted disease?: No Injury to kidneys or bladder?: No Painful intercourse?: No Weak stream?: No Erection problems?: No Penile pain?: No  Gastrointestinal Nausea?: No Vomiting?: No Indigestion/heartburn?: No Diarrhea?: No Constipation?: No  Constitutional Fever: No Night sweats?: No Weight loss?: No Fatigue?: No  Skin Skin rash/lesions?: No Itching?: No  Eyes Blurred vision?: No Double vision?: No  Ears/Nose/Throat Sore throat?: No Sinus problems?: No  Hematologic/Lymphatic Swollen glands?: No Easy bruising?: No  Cardiovascular Leg swelling?: No Chest pain?: No  Respiratory Cough?: No Shortness of breath?: No  Endocrine Excessive thirst?: No  Musculoskeletal Back pain?: Yes Joint pain?: Yes  Neurological Headaches?: Yes Dizziness?: No  Psychologic Depression?: No Anxiety?: No  Physical Exam: BP 115/69 (BP Location: Right Arm, Patient Position: Sitting, Cuff Size: Large)   Pulse (!) 59   Ht 5\' 9"  (1.753 m)   Wt 193 lb (87.5 kg)   BMI 28.50 kg/m   Constitutional:  Alert and oriented, No acute distress. HEENT: [place] AT,  moist mucus membranes.  Trachea midline, no masses. Cardiovascular: No clubbing, cyanosis, or edema. Respiratory: Normal respiratory effort, no increased work of breathing. GI: Abdomen is soft, nontender, nondistended, no abdominal masses GU: No CVA tenderness.  Skin: No rashes, bruises or suspicious lesions. Lymph: No cervical or inguinal adenopathy. Neurologic: Grossly intact, no focal deficits, moving all 4 extremities. Psychiatric: Normal mood and affect.  Laboratory Data: Lab Results  Component Value Date   WBC 7.8 11/17/2015   HGB 14.6 11/17/2015   HCT 41.7 11/17/2015   MCV 90.6 11/17/2015   PLT 235 11/17/2015  Lab Results  Component Value Date   CREATININE 1.01 02/25/2016    No results found for: PSA  No results found for: TESTOSTERONE  No results found for: HGBA1C  Urinalysis    Component Value Date/Time   COLORURINE AMBER (A) 11/17/2015 0720   APPEARANCEUR Clear 03/21/2016 0855   LABSPEC 1.019 11/17/2015 0720   PHURINE 7.0 11/17/2015 0720   GLUCOSEU Negative 03/21/2016 0855   HGBUR NEGATIVE 11/17/2015 0720   BILIRUBINUR Negative 03/21/2016 0855   KETONESUR 1+ (A) 11/17/2015 0720   PROTEINUR Negative 03/21/2016 0855   PROTEINUR 30 (A) 11/17/2015 0720   NITRITE Negative 03/21/2016 0855   NITRITE POSITIVE (A) 11/17/2015 0720   LEUKOCYTESUR Negative 03/21/2016 0855    Pertinent Imaging: Renal u/s reviewed as above   Cystoscopy Procedure Note  Patient identification was confirmed, informed consent was obtained, and patient was prepped using Betadine solution.  Lidocaine jelly was administered per urethral meatus.    Preoperative abx where received prior to procedure.     Pre-Procedure: - Inspection reveals a normal caliber ureteral meatus.  Procedure: The flexible cystoscope was introduced without difficulty - No urethral strictures/lesions are present. - Enlarged prostate  - Normal bladder neck - Bilateral ureteral orifices identified -  Bladder mucosa  reveals no ulcers, tumors, or lesions - No bladder stones - No trabeculation  Retroflexion shows small intravesical lobe.   Post-Procedure: - Patient tolerated the procedure well    Assessment & Plan:    1. Right nonobstructing renal stone Stone is small so no intervention required at this time. The patient has requested a prescription for Toradol in the event that he starts passing the stone. A prescription for 5 days of Toradol was called into his pharmacy.  2. Right renal lesion No sign of malignancy on renal ultrasound  3. Gross hematuria Negative except for above  4. Left undescended testicle.  No masses palpable on left testes when brought into scrotum. No further work up  5. Prostate cancer screening Patient be due for repeat DRE and PSA in July 2018.  6. Bladder lesion on ultrasound No lesion on cystoscopy today  Return in about 6 months (around 04/01/2017) for PSA.  Nickie Retort, MD  [facility] [address], [place], [place] [address] [phone]

## 2016-10-06 ENCOUNTER — Other Ambulatory Visit: Payer: Self-pay | Admitting: Pain Medicine

## 2017-03-30 ENCOUNTER — Other Ambulatory Visit: Payer: Medicare Other

## 2017-03-30 DIAGNOSIS — N400 Enlarged prostate without lower urinary tract symptoms: Secondary | ICD-10-CM

## 2017-03-31 LAB — PSA TOTAL (REFLEX TO FREE): Prostate Specific Ag, Serum: 0.5 ng/mL (ref 0.0–4.0)

## 2017-04-02 ENCOUNTER — Ambulatory Visit (INDEPENDENT_AMBULATORY_CARE_PROVIDER_SITE_OTHER): Payer: Medicare Other | Admitting: Urology

## 2017-04-02 ENCOUNTER — Encounter: Payer: Self-pay | Admitting: Urology

## 2017-04-02 VITALS — BP 115/67 | HR 52 | Ht 69.0 in | Wt 193.6 lb

## 2017-04-02 DIAGNOSIS — Z125 Encounter for screening for malignant neoplasm of prostate: Secondary | ICD-10-CM

## 2017-04-02 DIAGNOSIS — N200 Calculus of kidney: Secondary | ICD-10-CM | POA: Diagnosis not present

## 2017-04-02 MED ORDER — ONDANSETRON HCL 4 MG PO TABS: 4.0000 mg | ORAL_TABLET | Freq: Three times a day (TID) | ORAL | 0 refills | Status: DC | PRN

## 2017-04-02 NOTE — Progress Notes (Signed)
04/02/2017 9:27 AM   Tanner Baker [birth year] [id number]  Referring provider: Kirk Ruths, MD [facility] [place], [place] [address]  Chief Complaint  Patient presents with  . Benign Prostatic Hypertrophy    HPI: The patient is a 66 year old gentleman presents today for follow-up. He has a history of gross hematuria with a negative workup. Workup didn't reveal a benign renal cyst as well as what appeared to be a bladder mass. He also had a nonobstructing 3 mm right renal stone. Just past the previous stone as well. Cystoscopy was negative in. He also has a history of a left undescended testicle that is palpable and benign near the inguinal canal. He presents today for prostate cancer screening follow-up. PSA was 0.5 in July 2018. He otherwise is voiding well. No more hematuria. Has not passed any kidney stones. No urinary tract infections. No complaints at this time.  The patient is on chronic Zofran for nausea. He is requesting a prescription for this at today's visit.  PMH: Past Medical History:  Diagnosis Date  . Anxiety   . Cancer ([place])    skin cancer  . Chronic pain syndrome   . DDD (degenerative disc disease), cervical   . Depression   . Depression   . Glaucoma   . Heartburn   . Hypertension   . Kidney stone   . Kidney stones   . Migraine   . Migraine   . Post-traumatic headache   . Sleep apnea   . Traumatic arthritis     Surgical History: Past Surgical History:  Procedure Laterality Date  . arm surg Right 1995  . COSMETIC SURGERY  1994   all of teeth  . SKIN CANCER EXCISION    . TONSILLECTOMY    . WISDOM TOOTH EXTRACTION      Home Medications:  Allergies as of 04/02/2017      Reactions   Doxycycline Rash      Medication List       Accurate as of 04/02/17  9:27 AM. Always use your most recent med list.          busPIRone 15 MG tablet Commonly known as:  BUSPAR Take 15 mg by mouth 3 (three) times daily.   fentaNYL 50 MCG/HR Commonly known as:  DURAGESIC - dosed mcg/hr Applied 1 patch to skin every 3 days if tolerated   FLONASE 50 MCG/ACT nasal spray Generic drug:  fluticasone Place 1 spray into both nostrils daily as needed. Reported on 03/21/2016   gabapentin 100 MG capsule Commonly known as:  NEURONTIN Limit 2 - 3 tabs bid to tid if tolerated   hydrochlorothiazide 25 MG tablet Commonly known as:  HYDRODIURIL Take 25 mg by mouth daily.   HYDROcodone-acetaminophen 7.5-325 MG tablet Commonly known as:  NORCO Limit 1 tablet daily, bid to tid for breakthrough pain if needed and if tolerated while wearing fentanyl patch   ketorolac 10 MG tablet Commonly known as:  TORADOL Take 1 tablet (10 mg total) by mouth every 8 (eight) hours as needed.   latanoprost 0.005 % ophthalmic solution Commonly known as:  XALATAN Place 2 drops into both eyes at bedtime.   ondansetron 4 MG tablet Commonly known as:  ZOFRAN Take 1 tablet (4 mg total) by mouth every 8 (eight) hours as needed for nausea or vomiting.   ondansetron 4 MG tablet Commonly known as:  ZOFRAN Take 1 tablet (4 mg total) by mouth every 8 (eight) hours  as needed for nausea or vomiting.   PARoxetine 20 MG tablet Commonly known as:  PAXIL Take 20 mg by mouth daily. Reported on 03/21/2016   polyethylene glycol powder powder Commonly known as:  GLYCOLAX/MIRALAX Take by mouth daily.   propranolol 20 MG tablet Commonly known as:  INDERAL Take 60 mg by mouth 2 (two) times daily.   ranitidine 150 MG tablet Commonly known as:  ZANTAC Take 150 mg by mouth 2 (two) times daily.   SUMAtriptan 100 MG tablet Commonly known as:  IMITREX Take 100 mg by mouth once. May repeat in 2 hours if headache persists or recurs.   tamsulosin 0.4 MG Caps capsule Commonly known as:  FLOMAX Take 1 capsule (0.4 mg total) by mouth daily.   traZODone 50 MG tablet Commonly known as:  DESYREL Take 100 mg by mouth at bedtime as needed for sleep.  Take 1-2 tablets at bedtime as needed   vitamin C 100 MG tablet Take 100 mg by mouth daily.   VITAMIN D-3 PO Take 1,000 Int'l Units/day by mouth every morning. Reported on 01/01/2016   vitamin E 1000 UNIT capsule Take 1,000 Units by mouth daily.       Allergies:  Allergies  Allergen Reactions  . Doxycycline Rash    Family History: Family History  Problem Relation Age of Onset  . Cancer Mother        melanoma  . Diabetes Father   . Heart disease Father   . Cancer Father        melanoma  . Lung cancer Mother   . Kidney disease Neg Hx   . Prostate cancer Paternal Uncle   . Kidney Stones Father     Social History:  reports that he has quit smoking. He has never used smokeless tobacco. He reports that he does not drink alcohol or use drugs.  ROS: UROLOGY Frequent Urination?: No Hard to postpone urination?: No Burning/pain with urination?: No Get up at night to urinate?: No Leakage of urine?: No Urine stream starts and stops?: No Trouble starting stream?: No Do you have to strain to urinate?: No Blood in urine?: No Urinary tract infection?: No Sexually transmitted disease?: No Injury to kidneys or bladder?: No Painful intercourse?: No Weak stream?: No Erection problems?: No Penile pain?: No  Gastrointestinal Nausea?: No Vomiting?: No Indigestion/heartburn?: Yes Diarrhea?: No Constipation?: Yes  Constitutional Fever: No Night sweats?: No Weight loss?: No Fatigue?: No  Skin Skin rash/lesions?: No Itching?: No  Eyes Blurred vision?: No Double vision?: No  Ears/Nose/Throat Sore throat?: No Sinus problems?: No  Hematologic/Lymphatic Swollen glands?: No Easy bruising?: No  Cardiovascular Leg swelling?: No Chest pain?: No  Respiratory Cough?: No Shortness of breath?: No  Endocrine Excessive thirst?: No  Musculoskeletal Back pain?: Yes Joint pain?: No  Neurological Headaches?: Yes Dizziness?: No  Psychologic Depression?:  No Anxiety?: No  Physical Exam: BP 115/67 (BP Location: Left Arm, Patient Position: Sitting, Cuff Size: Normal)   Pulse (!) 52   Ht 5\' 9"  (1.753 m)   Wt 193 lb 9.6 oz (87.8 kg)   BMI 28.59 kg/m   Constitutional:  Alert and oriented, No acute distress. HEENT: [place] AT, moist mucus membranes.  Trachea midline, no masses. Cardiovascular: No clubbing, cyanosis, or edema. Respiratory: Normal respiratory effort, no increased work of breathing. GI: Abdomen is soft, nontender, nondistended, no abdominal masses GU: No CVA tenderness. DRE: 2+ smooth benign. Skin: No rashes, bruises or suspicious lesions. Lymph: No cervical or inguinal adenopathy. Neurologic: Grossly  intact, no focal deficits, moving all 4 extremities. Psychiatric: Normal mood and affect.  Laboratory Data: Lab Results  Component Value Date   WBC 7.8 11/17/2015   HGB 14.6 11/17/2015   HCT 41.7 11/17/2015   MCV 90.6 11/17/2015   PLT 235 11/17/2015    Lab Results  Component Value Date   CREATININE 1.01 02/25/2016    No results found for: PSA  No results found for: TESTOSTERONE  No results found for: HGBA1C  Urinalysis    Component Value Date/Time   COLORURINE AMBER (A) 11/17/2015 0720   APPEARANCEUR Clear 03/21/2016 0855   LABSPEC 1.019 11/17/2015 0720   PHURINE 7.0 11/17/2015 0720   GLUCOSEU Negative 03/21/2016 0855   HGBUR NEGATIVE 11/17/2015 0720   BILIRUBINUR Negative 03/21/2016 0855   KETONESUR 1+ (A) 11/17/2015 0720   PROTEINUR Negative 03/21/2016 0855   PROTEINUR 30 (A) 11/17/2015 0720   NITRITE Negative 03/21/2016 0855   NITRITE POSITIVE (A) 11/17/2015 0720   LEUKOCYTESUR Negative 03/21/2016 0855    Assessment & Plan:    1. Prostate cancer screening Up-to-date. Follow-up in one year with repeat PSA prior.  2. Right nonobstructing renal stone Stone is small, so no intervention required at this time.  3. Gross hematuria -negative workup. Resolved. Recheck urinalysis in one year.  4.  Nausea I have offered to fill the patient's Zofran one time. Since this is a chronic problem, he will need to have further refills from his primary care provider.  Return in about 1 year (around 04/02/2018) for PSA prior.  Nickie Retort, MD  [facility] [address], [place], [place] [address] [phone]

## 2017-04-12 IMAGING — CT CT ABD-PEL WO/W CM
3 of 10 series · 12 of 46 positions shown, 18 images · IV contrast (iopamidol)
Comparison: 02/15/2016 renal ultrasound.  11/17/2015 stone study.

CLINICAL DATA: History kidney stones. Gross hematuria and
hemoptysis in [REDACTED]. History of melanoma.

EXAM:
CT ABDOMEN AND PELVIS WITHOUT AND WITH CONTRAST
TECHNIQUE: Multidetector CT imaging of the abdomen and pelvis was performed
following the standard protocol before and following the bolus
administration of intravenous contrast.
CONTRAST:  125mL A3IYAI-8OO IOPAMIDOL (A3IYAI-8OO) INJECTION 61%

[Series 4: hematuria < 45 with 100s · axial · 0.68mm/px · z∈[-932,-572]mm · 8 of 94 slices shown, 13 images]
[im 11/94  soft-tissue]
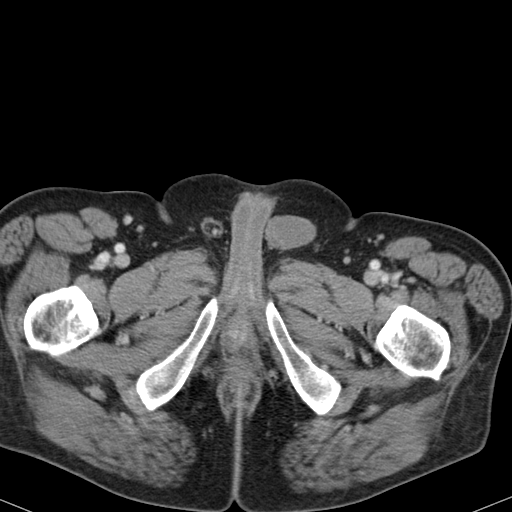
[im 11/94  bone]
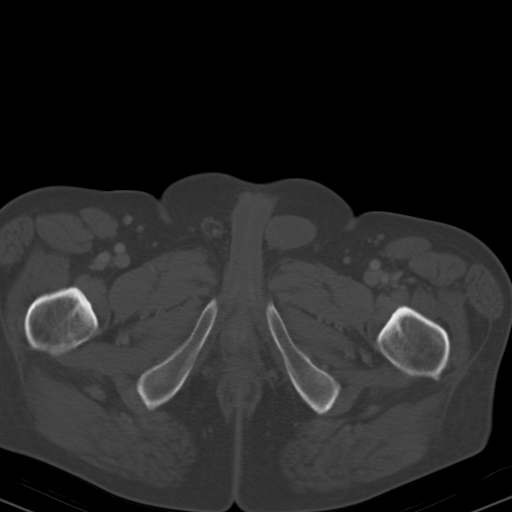
[im 21/94  soft-tissue]
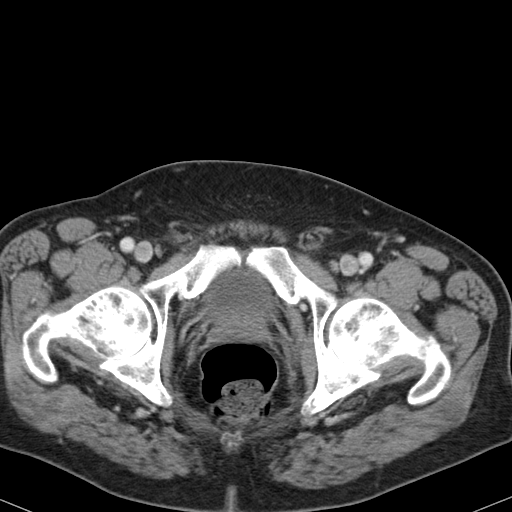
[im 32/94  soft-tissue]
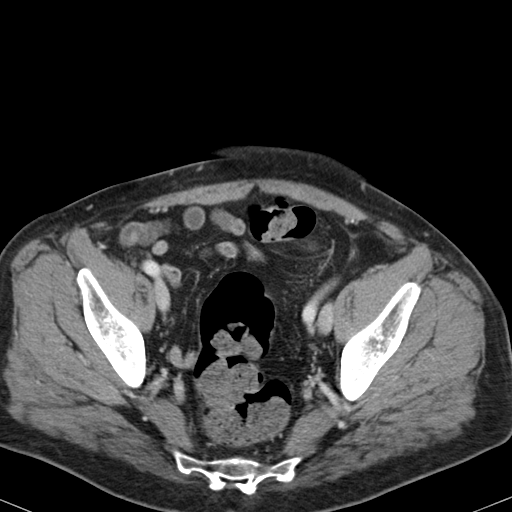
[im 42/94  soft-tissue]
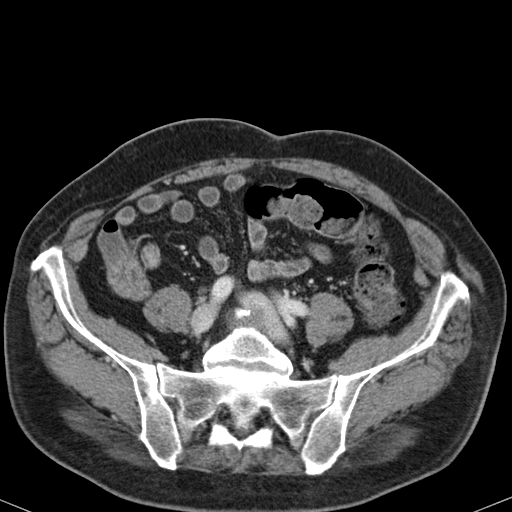
[im 52/94  soft-tissue]
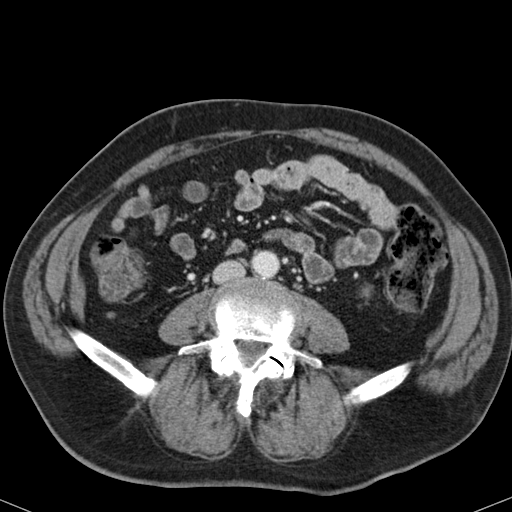
[im 52/94  lung]
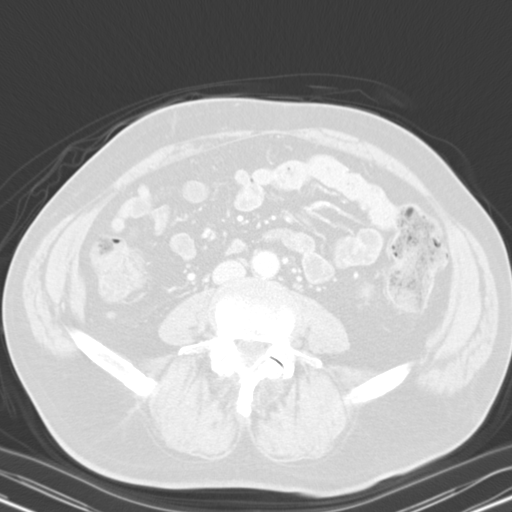
[im 63/94  soft-tissue]
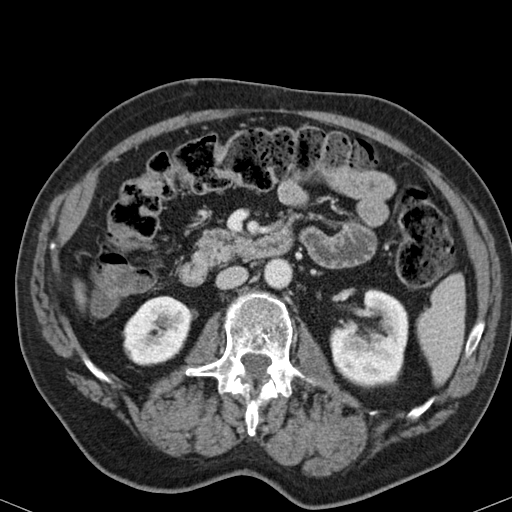
[im 63/94  lung]
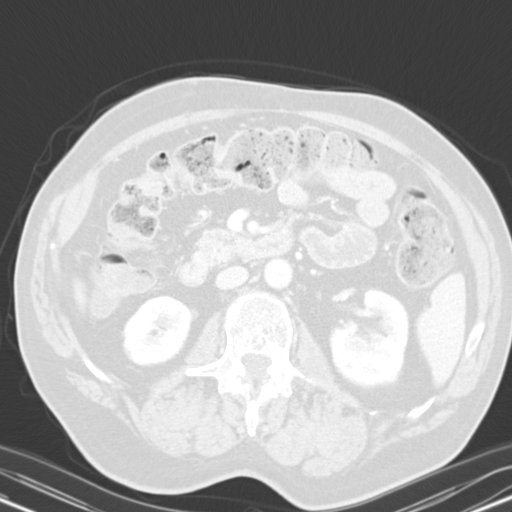
[im 73/94  soft-tissue]
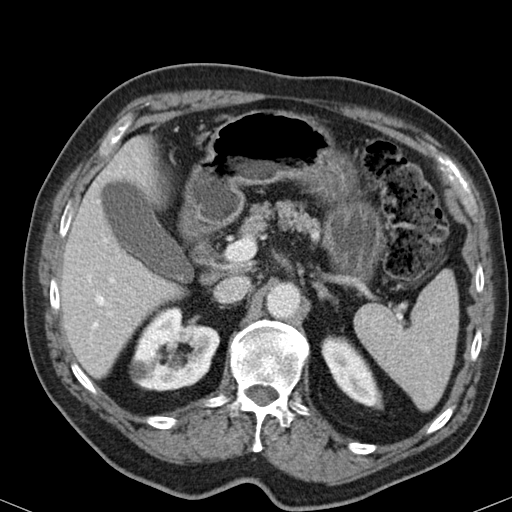
[im 73/94  lung]
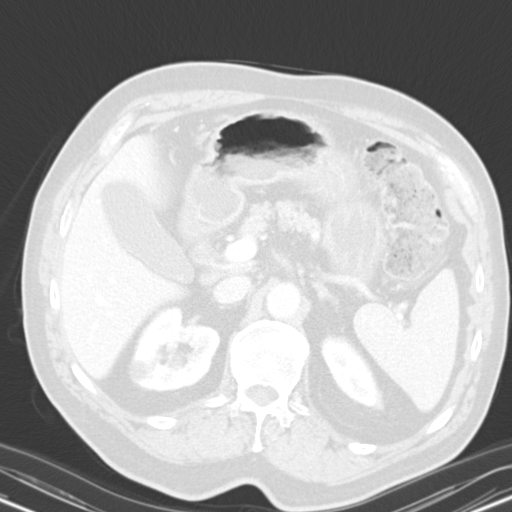
[im 83/94  soft-tissue]
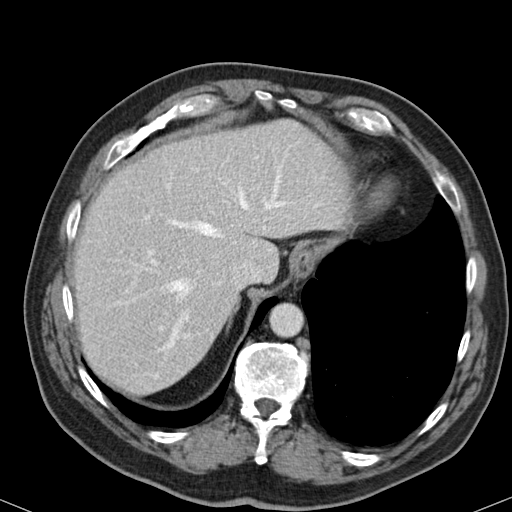
[im 83/94  lung]
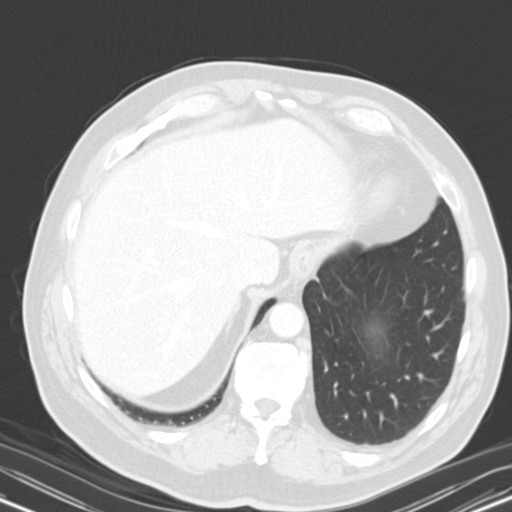

[Series 8: hematuria > 45 10 min delay · axial · delayed · 0.68mm/px · z∈[-876,-821]mm · 2 of 98 slices shown]
[im 11/98  soft-tissue]
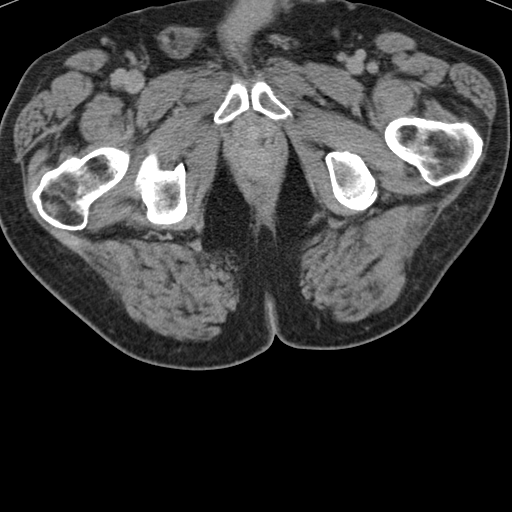
[im 22/98  soft-tissue]
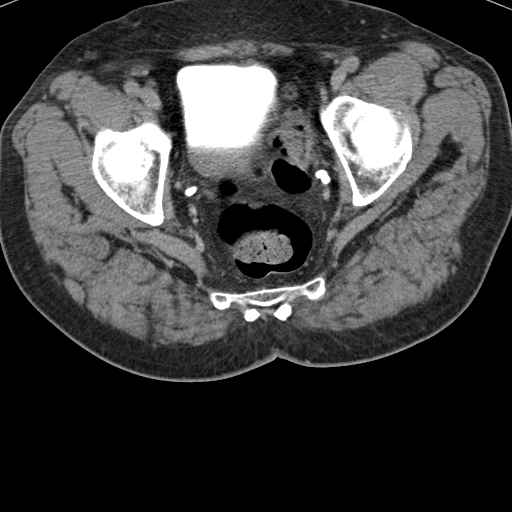

[Series 602: coronal wo · coronal · 0.91mm/px · 2 of 129 slices shown, 3 images]
[im 43/129  soft-tissue]
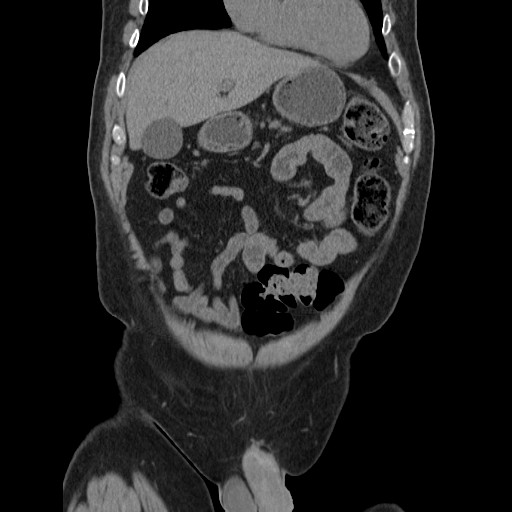
[im 43/129  bone]
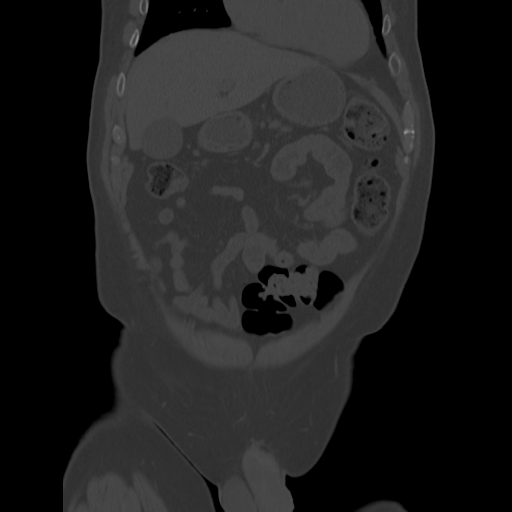
[im 86/129  soft-tissue]
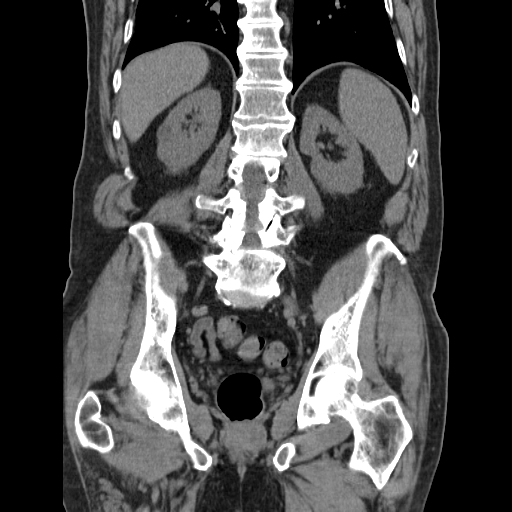

[12 of 46 positions shown; findings below may reference images not displayed]

FINDINGS: Lower chest: 3 mm right middle lobe pulmonary nodule on image 4/
series 6 is not significantly changed. Normal heart size without
pericardial or pleural effusion.

Hepatobiliary: Focal steatosis adjacent the falciform ligament.
Normal gallbladder, without biliary ductal dilatation.

Pancreas: Mild pancreatic atrophy, without duct dilatation or mass.

Spleen: Normal in size, without focal abnormality.

Adrenals/Urinary Tract: Normal adrenal glands. 3 mm right renal
collecting system calculus. Resolution of left-sided
hydroureteronephrosis with passage of distal left ureteric stone. No
bladder calculi.

14 mm interpolar right renal lesion is fluid density, including
image 23/series 4. This suggestion of subtle complexity within,
including on transverse image 23/series 4 and image 19/series 604
coronal.

Lower pole left renal lesion measures 2.2 cm and 11 HU, consistent
with a cyst. There are too small to characterize lesions in the left
kidney, which are most likely cysts.

Moderate renal collecting system and good ureteric opacification,
without filling defect. The left renal collecting system is
duplicated. Normal insertion of both left ureters.

No enhancing bladder mass. Non-opacified urine identified within the
posterior bladder. Given this factor, no bladder filling defect
seen. No correlate for the ultrasound abnormality which was likely
artifactual.

Stomach/Bowel: Normal stomach, without wall thickening. Colonic
stool burden suggests constipation. Normal small bowel. Normal
terminal ileum and appendix.

Vascular/Lymphatic: Aortic and branch vessel atherosclerosis. No
abdominopelvic adenopathy.

Reproductive: Normal prostate. Probable nondistended left testicle,
as before. Tiny right head still.

Other: No significant free fluid.

Musculoskeletal: Bilateral L5 pars defects. Trace L4-5
anterolisthesis. Mild superior endplate compression deformity at L1
is similar.
IMPRESSION: 1. Interval passage of obstructive distal left ureteric stone.
Nonobstructive right nephrolithiasis remains.
2. No correlate for the ultrasound abnormality within the bladder.
3. A right renal lesion which is fluid density but may have minimal
complexity Posteriorly. Potential clinical strategies include
definitive characterization with pre and post contrast abdominal MRI
versus ultrasound surveillance in 6 months to confirm size
stability.
4. Right lung base nodule. Given the history of melanoma, this
should be considered for follow-up with chest CT at approximately 6
months.
5. Nondistended left testicle.
6.  Aortic atherosclerosis.
7. Duplicated left renal collecting system.

## 2017-05-07 ENCOUNTER — Telehealth: Payer: Self-pay | Admitting: Urology

## 2017-05-07 NOTE — Telephone Encounter (Signed)
Pt called back and hasn't heard anything.  He would like to be called on his cell# [phone].

## 2017-05-07 NOTE — Telephone Encounter (Signed)
Pt called office asking if Dr. Pilar Jarvis would call him in an antibiotic for what he thinks may be a prostate infection that is starting to flare up. Not passing blood. He would like for this medication to be called in at the [facility] in [place].

## 2017-05-07 NOTE — Telephone Encounter (Signed)
Please advise 

## 2017-05-08 ENCOUNTER — Ambulatory Visit (INDEPENDENT_AMBULATORY_CARE_PROVIDER_SITE_OTHER): Payer: Medicare Other

## 2017-05-08 VITALS — BP 145/76 | HR 56 | Ht 69.0 in | Wt 180.0 lb

## 2017-05-08 DIAGNOSIS — R300 Dysuria: Secondary | ICD-10-CM | POA: Diagnosis not present

## 2017-05-08 LAB — URINALYSIS, COMPLETE
BILIRUBIN UA: NEGATIVE
GLUCOSE, UA: NEGATIVE
KETONES UA: NEGATIVE
Leukocytes, UA: NEGATIVE
NITRITE UA: NEGATIVE
Protein, UA: NEGATIVE
RBC UA: NEGATIVE
Specific Gravity, UA: 1.025 (ref 1.005–1.030)
UUROB: 0.2 mg/dL (ref 0.2–1.0)
pH, UA: 6.5 (ref 5.0–7.5)

## 2017-05-08 NOTE — Progress Notes (Signed)
Pt presents today with c/o dysuria and back pain. Urine was sent for u/a and cx.   Blood pressure (!) 145/76, pulse (!) 56, height 5\' 9"  (1.753 m), weight 180 lb (81.6 kg).

## 2017-05-08 NOTE — Telephone Encounter (Signed)
Pt was added to nurse schedule today.

## 2017-05-11 ENCOUNTER — Telehealth: Payer: Self-pay | Admitting: Urology

## 2017-05-11 MED ORDER — TAMSULOSIN HCL 0.4 MG PO CAPS: 0.4000 mg | ORAL_CAPSULE | Freq: Every day | ORAL | 11 refills | Status: DC

## 2017-05-11 NOTE — Telephone Encounter (Signed)
Pt would like a refill on Flomax

## 2017-05-12 LAB — CULTURE, URINE COMPREHENSIVE

## 2017-05-13 ENCOUNTER — Other Ambulatory Visit: Payer: Self-pay

## 2017-05-13 MED ORDER — TAMSULOSIN HCL 0.4 MG PO CAPS: 0.4000 mg | ORAL_CAPSULE | Freq: Every day | ORAL | 11 refills | Status: DC

## 2017-12-02 NOTE — Congregational Nurse Program (Signed)
Congregational Nurse Program Note  Date of Encounter: 12/02/2017  Past Medical History: Past Medical History:  Diagnosis Date  . Anxiety   . Cancer ([place])    skin cancer  . Chronic pain syndrome   . DDD (degenerative disc disease), cervical   . Depression   . Depression   . Glaucoma   . Heartburn   . Hypertension   . Kidney stone   . Kidney stones   . Migraine   . Migraine   . Post-traumatic headache   . Sleep apnea   . Traumatic arthritis     Encounter Details: CNP Questionnaire - 12/02/17 1138      Questionnaire   Patient Status  Not Applicable    Race  White or Caucasian    Location Patient Served At  [employer], [place]  No food insecurities    Housing/Utilities  Yes, have permanent housing    Transportation  No transportation needs    Interpersonal Safety  Yes, feel physically and emotionally safe where you currently live    Medication  No medication insecurities    Medical Provider  Yes    Referrals  Not Applicable    ED Visit Averted  Not Applicable    Life-Saving Intervention Made  Not Applicable     Seen at [employer] . [address]/P [address], P [place], [place] Program [phone]

## 2018-01-01 IMAGING — US US RENAL
1 series · 14 of 25 positions shown · non-contrast
Comparison: None.

CLINICAL DATA: Kidney stones

EXAM:
RENAL / URINARY TRACT ULTRASOUND COMPLETE

[Series 1: us renal · 0.23mm/px · 14 of 58 slices shown]
[im 1/58]
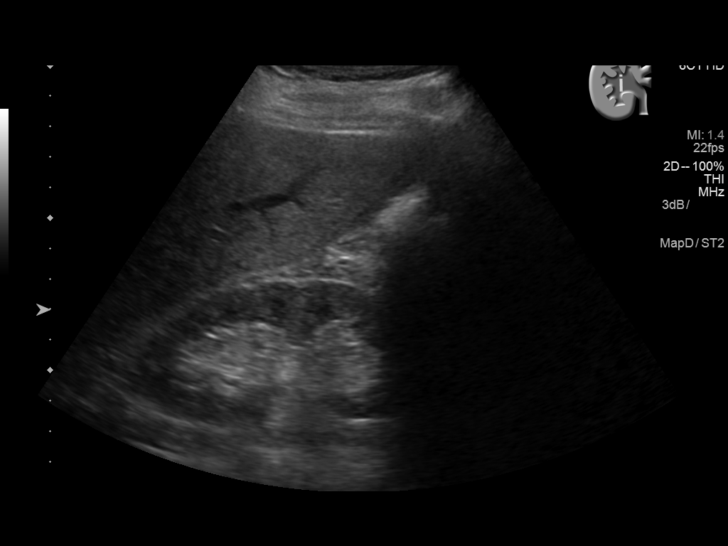
[im 5/58]
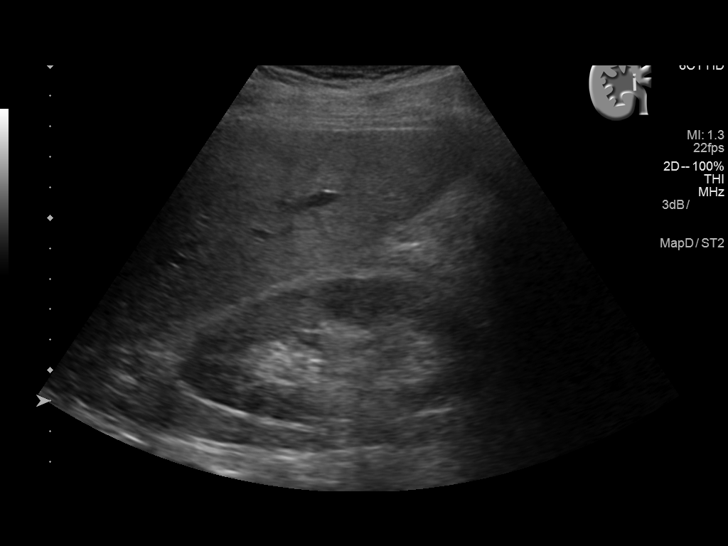
[im 10/58]
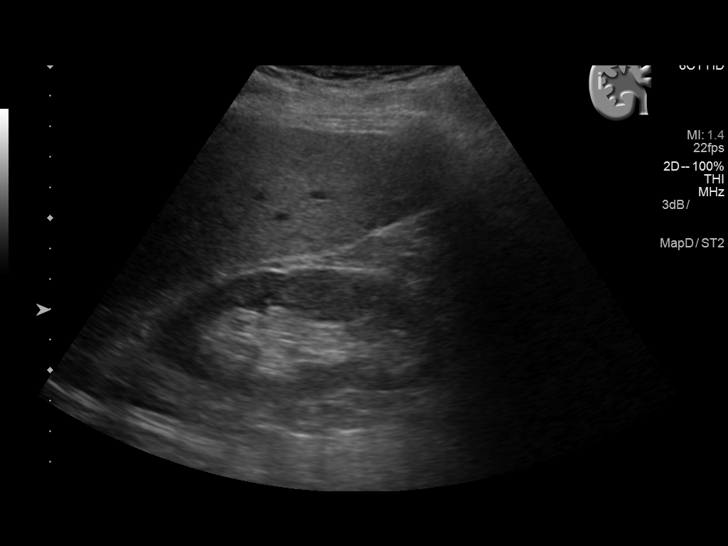
[im 15/58]
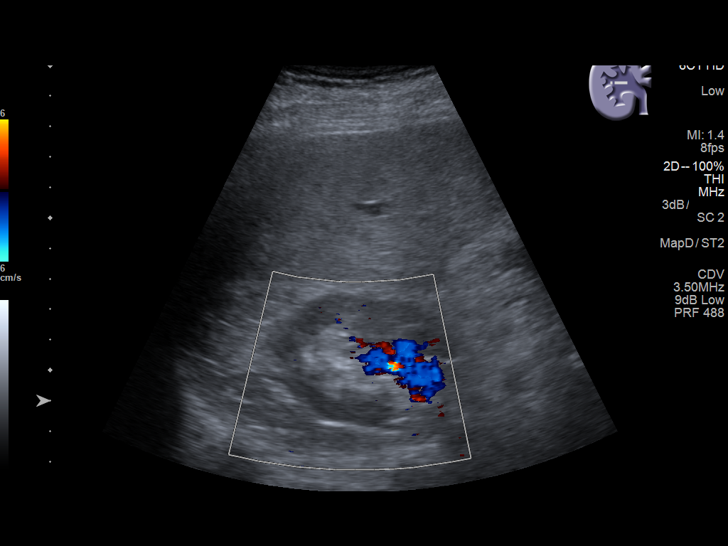
[im 20/58]
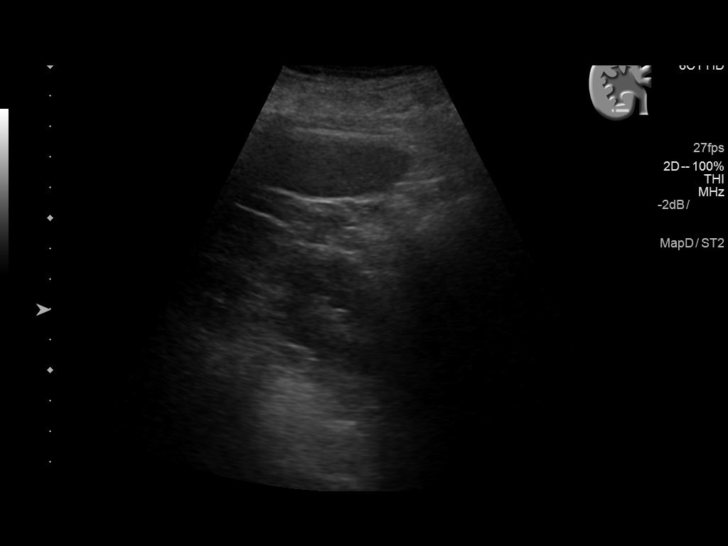
[im 22/58]
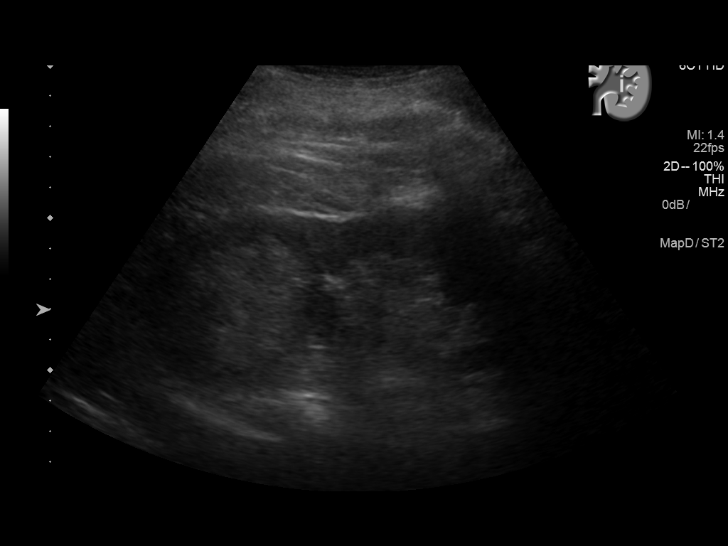
[im 27/58]
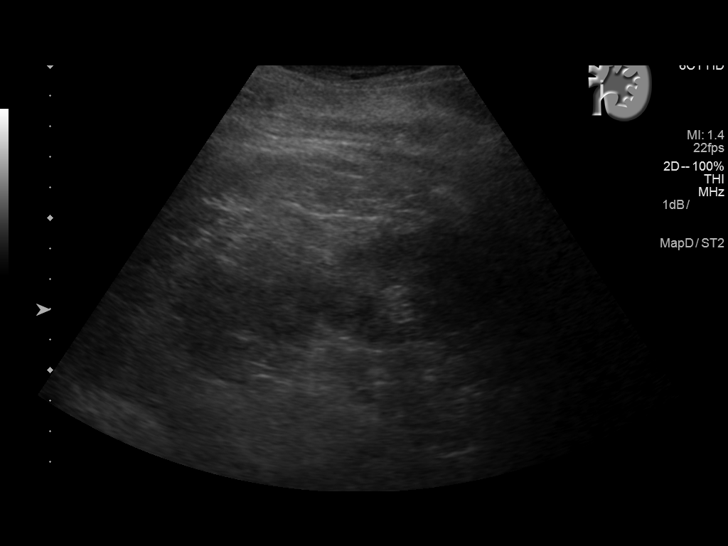
[im 31/58]
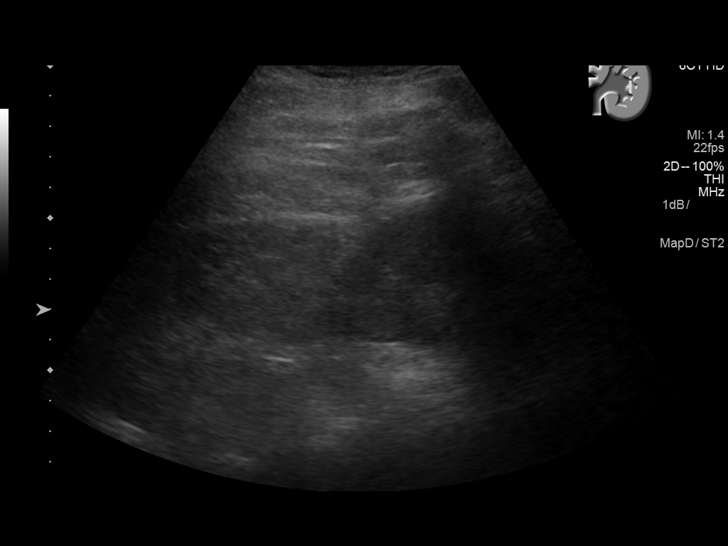
[im 36/58]
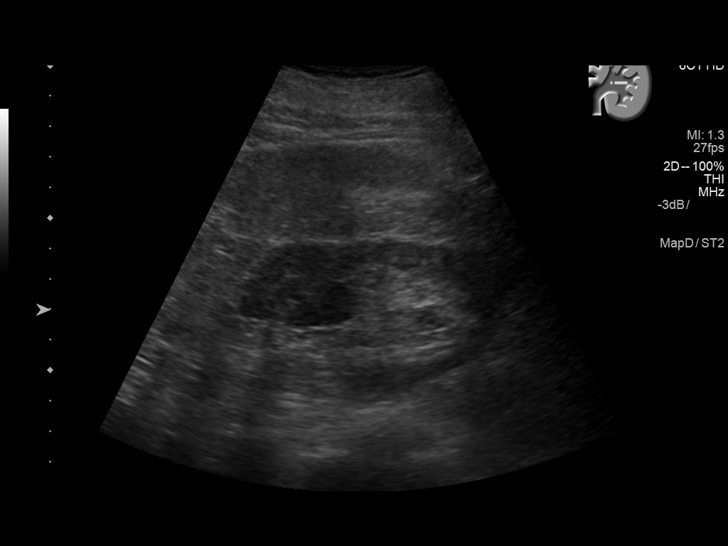
[im 39/58]
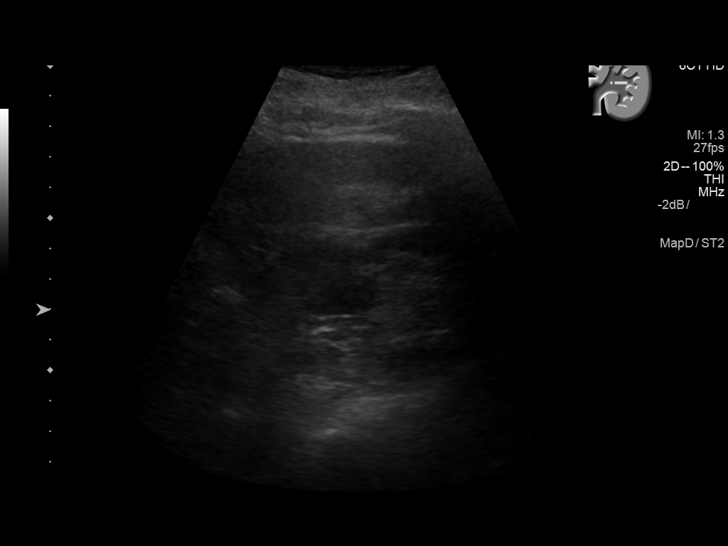
[im 43/58]
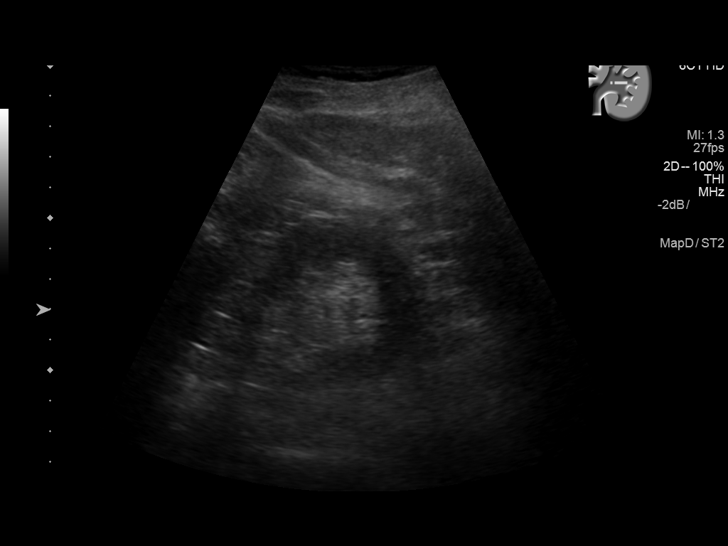
[im 48/58]
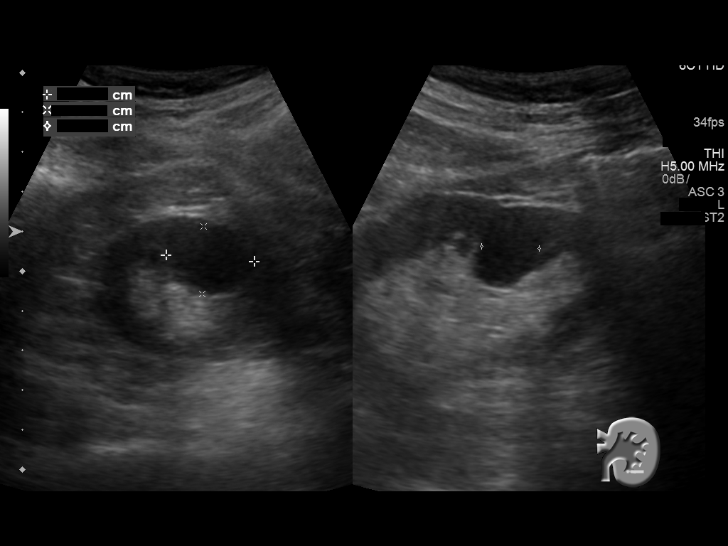
[im 53/58]
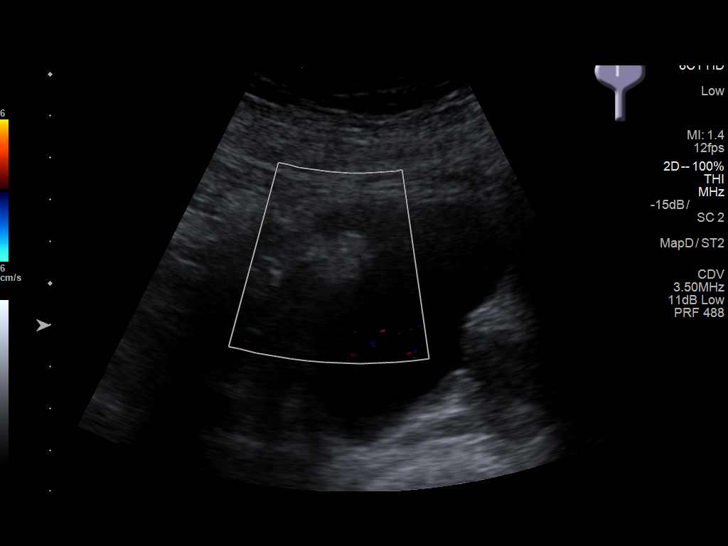
[im 58/58]
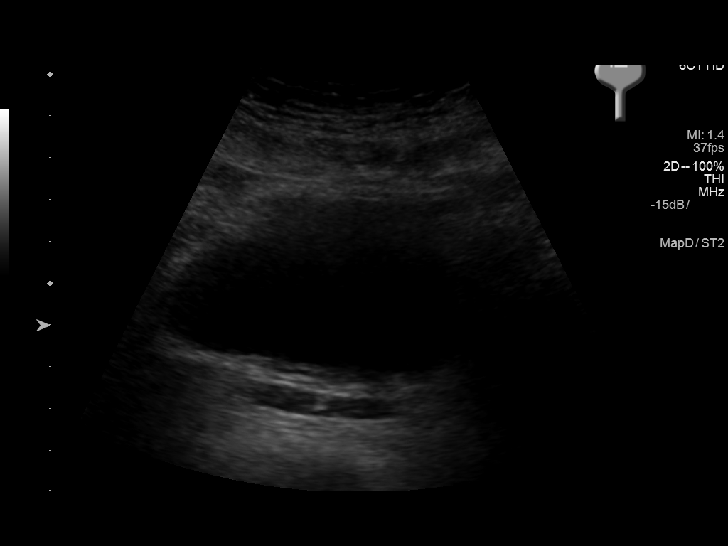

[14 of 25 positions shown; findings below may reference images not displayed]

FINDINGS: Right Kidney:

Length: 10.8 cm. Upper pole cyst measures 1.5 x 1.6 x 1.8 cm.
Echogenicity within normal limits. No mass or hydronephrosis
visualized.

Left Kidney:

Length: 12.4 cm. Inferior pole cyst measures 2.4 x 1.9 x 1.5 cm.
Within the midpole there is a cyst measuring 1.5 x 1.4 x 1.7 cm.
Echogenicity within normal limits. No mass or hydronephrosis
visualized.

Bladder:

Solid-appearing mass defect along the right lateral wall of the
bladder measures 1.3 x 1.3 x 1.9 cm.
IMPRESSION: 1. Bilateral renal cysts.
2. No hydronephrosis.
3. Indeterminate lesion along the right lateral wall of the urinary
bladder. Cannot rule out urothelial lesion. Recommend further
evaluation with hematuria protocol CT or MRI versus direct
visualization.

## 2018-01-07 NOTE — Congregational Nurse Program (Signed)
Congregational Nurse Program Note  Date of Encounter: 12/15/2017  Past Medical History: Past Medical History:  Diagnosis Date  . Anxiety   . Cancer ([place])    skin cancer  . Chronic pain syndrome   . DDD (degenerative disc disease), cervical   . Depression   . Depression   . Glaucoma   . Heartburn   . Hypertension   . Kidney stone   . Kidney stones   . Migraine   . Migraine   . Post-traumatic headache   . Sleep apnea   . Traumatic arthritis     Encounter Details: Seen at [employer] 128/81; P44 ( stated this is his normal) Erma Heritage RN, [facility] [phone]

## 2018-03-30 ENCOUNTER — Other Ambulatory Visit: Payer: Medicare Other

## 2018-03-30 DIAGNOSIS — Z125 Encounter for screening for malignant neoplasm of prostate: Secondary | ICD-10-CM

## 2018-03-31 LAB — PSA: Prostate Specific Ag, Serum: 0.6 ng/mL (ref 0.0–4.0)

## 2018-04-01 ENCOUNTER — Ambulatory Visit: Payer: Medicare Other

## 2018-04-16 ENCOUNTER — Other Ambulatory Visit: Payer: Self-pay

## 2018-04-16 ENCOUNTER — Encounter: Payer: Self-pay | Admitting: Urology

## 2018-04-16 ENCOUNTER — Ambulatory Visit: Payer: Medicare Other | Admitting: Urology

## 2018-04-16 VITALS — BP 120/66 | HR 50 | Ht 69.0 in | Wt 175.8 lb

## 2018-04-16 DIAGNOSIS — Z125 Encounter for screening for malignant neoplasm of prostate: Secondary | ICD-10-CM | POA: Diagnosis not present

## 2018-04-16 DIAGNOSIS — N200 Calculus of kidney: Secondary | ICD-10-CM

## 2018-04-16 NOTE — Patient Instructions (Signed)
Dietary Guidelines to Help Prevent Kidney Stones Kidney stones are deposits of minerals and salts that form inside your kidneys. Your risk of developing kidney stones may be greater depending on your diet, your lifestyle, the medicines you take, and whether you have certain medical conditions. Most people can reduce their chances of developing kidney stones by following the instructions below. Depending on your overall health and the type of kidney stones you tend to develop, your dietitian may give you more specific instructions. What are tips for following this plan? Reading food labels  Choose foods with "no salt added" or "low-salt" labels. Limit your sodium intake to less than 1500 mg per day.  Choose foods with calcium for each meal and snack. Try to eat about 300 mg of calcium at each meal. Foods that contain 200-500 mg of calcium per serving include: ? 8 oz (237 ml) of milk, fortified nondairy milk, and fortified fruit juice. ? 8 oz (237 ml) of kefir, yogurt, and soy yogurt. ? 4 oz (118 ml) of tofu. ? 1 oz of cheese. ? 1 cup (300 g) of dried figs. ? 1 cup (91 g) of cooked broccoli. ? 1-3 oz can of sardines or mackerel.  Most people need 1000 to 1500 mg of calcium each day. Talk to your dietitian about how much calcium is recommended for you. Shopping  Buy plenty of fresh fruits and vegetables. Most people do not need to avoid fruits and vegetables, even if they contain nutrients that may contribute to kidney stones.  When shopping for convenience foods, choose: ? Whole pieces of fruit. ? Premade salads with dressing on the side. ? Low-fat fruit and yogurt smoothies.  Avoid buying frozen meals or prepared deli foods.  Look for foods with live cultures, such as yogurt and kefir. Cooking  Do not add salt to food when cooking. Place a salt shaker on the table and allow each person to add his or her own salt to taste.  Use vegetable protein, such as beans, textured vegetable  protein (TVP), or tofu instead of meat in pasta, casseroles, and soups. Meal planning  Eat less salt, if told by your dietitian. To do this: ? Avoid eating processed or premade food. ? Avoid eating fast food.  Eat less animal protein, including cheese, meat, poultry, or fish, if told by your dietitian. To do this: ? Limit the number of times you have meat, poultry, fish, or cheese each week. Eat a diet free of meat at least 2 days a week. ? Eat only one serving each day of meat, poultry, fish, or seafood. ? When you prepare animal protein, cut pieces into small portion sizes. For most meat and fish, one serving is about the size of one deck of cards.  Eat at least 5 servings of fresh fruits and vegetables each day. To do this: ? Keep fruits and vegetables on hand for snacks. ? Eat 1 piece of fruit or a handful of berries with breakfast. ? Have a salad and fruit at lunch. ? Have two kinds of vegetables at dinner.  Limit foods that are high in a substance called oxalate. These include: ? Spinach. ? Rhubarb. ? Beets. ? Potato chips and french fries. ? Nuts.  If you regularly take a diuretic medicine, make sure to eat at least 1-2 fruits or vegetables high in potassium each day. These include: ? Avocado. ? Banana. ? Orange, prune, carrot, or tomato juice. ? Baked potato. ? Cabbage. ? Beans and split peas.   General instructions  Drink enough fluid to keep your urine clear or pale yellow. This is the most important thing you can do.  Talk to your health care provider and dietitian about taking daily supplements. Depending on your health and the cause of your kidney stones, you may be advised: ? Not to take supplements with vitamin C. ? To take a calcium supplement. ? To take a daily probiotic supplement. ? To take other supplements such as magnesium, fish oil, or vitamin B6.  Take all medicines and supplements as told by your health care provider.  Limit alcohol intake to no more  than 1 drink a day for nonpregnant women and 2 drinks a day for men. One drink equals 12 oz of beer, 5 oz of wine, or 1 oz of hard liquor.  Lose weight if told by your health care provider. Work with your dietitian to find strategies and an eating plan that works best for you. What foods are not recommended? Limit your intake of the following foods, or as told by your dietitian. Talk to your dietitian about specific foods you should avoid based on the type of kidney stones and your overall health. Grains Breads. Bagels. Rolls. Baked goods. Salted crackers. Cereal. Pasta. Vegetables Spinach. Rhubarb. Beets. Canned vegetables. Pickles. Olives. Meats and other protein foods Nuts. Nut butters. Large portions of meat, poultry, or fish. Salted or cured meats. Deli meats. Hot dogs. Sausages. Dairy Cheese. Beverages Regular soft drinks. Regular vegetable juice. Seasonings and other foods Seasoning blends with salt. Salad dressings. Canned soups. Soy sauce. Ketchup. Barbecue sauce. Canned pasta sauce. Casseroles. Pizza. Lasagna. Frozen meals. Potato chips. French fries. Summary  You can reduce your risk of kidney stones by making changes to your diet.  The most important thing you can do is drink enough fluid. You should drink enough fluid to keep your urine clear or pale yellow.  Ask your health care provider or dietitian how much protein from animal sources you should eat each day, and also how much salt and calcium you should have each day. This information is not intended to replace advice given to you by your health care provider. Make sure you discuss any questions you have with your health care provider. Document Released: 12/13/2010 Document Revised: 07/29/2016 Document Reviewed: 07/29/2016 Elsevier Interactive Patient Education  2018 Elsevier Inc.  

## 2018-04-16 NOTE — Progress Notes (Signed)
   04/16/2018 9:12 AM   [name] 01/16/1951 [id number]  Reason for visit: Follow up hx of nephrolithiasis, hematuria, LUTS, and PCa screening  HPI: I am seeing Mr. Alyea in urology clinic today for follow-up of the above.  He has been doing very well since we saw him last year, and his urinary symptoms are well controlled on Flomax.  He also had a negative hematuria work-up in July 2017.  He has past 3 to 4 stones in his lifetime, and never required surgery or stones.  On CT in July 2017 he had a nonobstructing right 3 mm midpole stone.  He denies any family history of prostate cancer.  He denies weak urinary stream, split stream, or dribbling.   ROS: Please see flowsheet from today's date for complete review of systems.  Physical Exam: BP 120/66   Pulse (!) 50   Ht 5\' 9"  (1.753 m)   Wt 175 lb 12.8 oz (79.7 kg)   BMI 25.96 kg/m   Constitutional:  Alert and oriented, No acute distress. Respiratory: Normal respiratory effort, no increased work of breathing. GI: Abdomen is soft, nontender, nondistended, no abdominal masses GU: No CVA tenderness, phallus without lesions, mild meatal stenosis.  Testicles descended bilaterally, right 30 cc no masses or lesions, left atrophic, 10 cc, and slightly high riding. DRE: 30 g gland, smooth, no nodules Skin: No rashes, bruises or suspicious lesions. Neurologic: Grossly intact, no focal deficits, moving all 4 extremities. Psychiatric: Normal mood and affect  Laboratory Data: PSA 0.6 (0.5)  Pertinent Imaging:  I have personally reviewed the CT urogram in July 2017: Notable for 3 mm right midpole nonobstructing stone  Assessment & Plan:   In summary, Mr. Hettinger is a 67 year old male we are following for mild lower urinary tract symptoms, history of nephrolithiasis, history of hematuria, and prostate cancer screening.  His urinary symptoms are currently well controlled on Flomax.  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  He has had no further episodes of gross hematuria.  Regarding his nonobstructive 3 mm right mid pole stone, we will plan to get a KUB in 1 year to evaluate for any enlargement.  Finally, in terms of prostate cancer screening his PSA remains very low at 0.6 and we can continue to screen on a yearly basis through age 84.  DRE is benign.  Return in about 1 year (around 04/17/2019) for RTC with me, KUB and PSA prior, IPSS.  Billey Co, [facility] [address] [place], [place] [address] [phone]

## 2019-04-15 ENCOUNTER — Other Ambulatory Visit: Payer: Medicare Other

## 2019-04-15 ENCOUNTER — Encounter: Payer: Self-pay | Admitting: Urology

## 2019-04-18 ENCOUNTER — Ambulatory Visit: Payer: Medicare Other | Admitting: Urology

## 2019-04-18 ENCOUNTER — Encounter: Payer: Self-pay | Admitting: Urology

## 2019-04-22 ENCOUNTER — Other Ambulatory Visit: Payer: Self-pay

## 2019-04-22 ENCOUNTER — Other Ambulatory Visit: Payer: Medicare Other

## 2019-04-22 DIAGNOSIS — Z125 Encounter for screening for malignant neoplasm of prostate: Secondary | ICD-10-CM

## 2019-04-23 LAB — PSA: Prostate Specific Ag, Serum: 0.6 ng/mL (ref 0.0–4.0)

## 2019-04-27 ENCOUNTER — Encounter: Payer: Self-pay | Admitting: Urology

## 2019-04-27 ENCOUNTER — Other Ambulatory Visit: Payer: Self-pay

## 2019-04-27 ENCOUNTER — Ambulatory Visit
Admission: RE | Admit: 2019-04-27 | Discharge: 2019-04-27 | Disposition: A | Discharge status: Home / Self Care | Payer: Medicare Other | Attending: Urology | Admitting: Urology

## 2019-04-27 ENCOUNTER — Ambulatory Visit
Admission: RE | Admit: 2019-04-27 | Discharge: 2019-04-27 | Disposition: A | Discharge status: Home / Self Care | Payer: Medicare Other | Source: Ambulatory Visit | Attending: Urology | Admitting: Urology

## 2019-04-27 ENCOUNTER — Ambulatory Visit: Payer: Medicare Other | Admitting: Urology

## 2019-04-27 VITALS — BP 116/63 | HR 52 | Ht 69.0 in | Wt 187.0 lb

## 2019-04-27 DIAGNOSIS — N200 Calculus of kidney: Secondary | ICD-10-CM | POA: Diagnosis present

## 2019-04-27 DIAGNOSIS — N401 Enlarged prostate with lower urinary tract symptoms: Secondary | ICD-10-CM

## 2019-04-27 DIAGNOSIS — N138 Other obstructive and reflux uropathy: Secondary | ICD-10-CM

## 2019-04-27 DIAGNOSIS — Z125 Encounter for screening for malignant neoplasm of prostate: Secondary | ICD-10-CM

## 2019-04-27 MED ORDER — TAMSULOSIN HCL 0.4 MG PO CAPS: 0.4000 mg | ORAL_CAPSULE | Freq: Every day | ORAL | 11 refills | Status: DC

## 2019-04-27 NOTE — Patient Instructions (Signed)
Prostate Cancer Screening  The prostate is a walnut-sized gland that is located below the bladder and in front of the rectum in males. The function of the prostate (prostate gland) is to add fluid to semen during ejaculation. Prostate cancer is the second most common type of cancer in men. A screening test for cancer is a test that is done before cancer symptoms start. Screening can help to identify cancer at an early stage, when the cancer can be treated more easily. The recommended prostate cancer screening test is a blood test called the prostate-specific antigen (PSA) test. PSA is a protein that is made in the prostate. As you age, your prostate naturally produces more PSA. Abnormally high PSA levels may be caused by:  Prostate cancer.  An enlarged prostate that is not caused by cancer (benign prostatic hyperplasia, BPH). This condition is very common in older men.  A prostate gland infection (prostatitis).  Medicines to assist with hair growth, such as finasteride. Depending on the PSA results, you may need more tests, such as:  A physical exam to check the size of your prostate gland.  Blood and imaging tests.  A procedure to remove tissue samples from your prostate gland for testing (biopsy). Who should have screening? Screening recommendations vary based on age.  If you are younger than age 40, screening is not recommended.  If you are age 40-54 and you have no risk factors, screening is not recommended.  If you are younger than age 55, ask your health care provider if you need screening if you have one of these risk factors: ? Being of African-American descent. ? Having a family history of prostate cancer.  If you are age 55-69, talk with your health care provider about your need for screening and how often screening should be done.  If you are older than age 70, screening is not recommended. This is because the risks that screening can cause are greater than the benefits  that it may provide (risks outweigh the benefits). If you are at high risk for prostate cancer, your health care provider may recommend that you have screenings more often or start screening at a younger age. You may be at high risk if you:  Are older than age 55.  Are African-American.  Have a father, brother, or uncle who has been diagnosed with prostate cancer. The risk may be higher if your family member's cancer occurred at an early age. What are the benefits of screening? There is a small chance that screening may lower your risk of dying from prostate cancer. The chance is small because prostate cancer is typically a slow-growing cancer, and most men with prostate cancer die from a different cause. What are the risks of screening? The main risk of prostate cancer screening is diagnosing and treating prostate cancer that would never have caused any symptoms or problems (overdiagnosis and overtreatment). PSA screening cannot tell you if your PSA is high due to cancer or a different cause. A prostate biopsy is the only procedure to diagnose prostate cancer. Even the results of a biopsy may not tell you if your cancer needs to be treated. Slow-growing prostate cancer may not need any treatment other than monitoring, so diagnosing and treating it may cause unnecessary stress or other side effects. A prostate biopsy may also cause:  Infection or fever.  A false negative. This is a result that shows that you do not have prostate cancer when you actually do have prostate cancer. Questions   to ask your health care provider  When should I start prostate cancer screening?  What is my risk for prostate cancer?  How often do I need screening?  What type of screening tests do I need?  How do I get my test results?  What do my results mean?  Do I need treatment? Contact a health care provider if:  You have difficulty urinating.  You have pain when you urinate or ejaculate.  You have  blood in your urine or semen.  You have pain in your back or in the area of your prostate.  You have trouble getting or maintaining an erection (erectile dysfunction, ED). Summary  Prostate cancer is a common type of cancer in men. The prostate (prostate gland) is located below the bladder and in front of the rectum. This gland adds fluid to semen during ejaculation.  Prostate cancer screening may identify cancer at an early stage, when the cancer can be treated more easily.  The prostate-specific antigen (PSA) test is the recommended screening test for prostate cancer.  Discuss the risks and benefits of prostate cancer screening with your health care provider. If you are age 70 or older, screening is likely to lead to more risks than benefits (risks outweigh the benefits). This information is not intended to replace advice given to you by your health care provider. Make sure you discuss any questions you have with your health care provider. Document Released: 05/29/2017 Document Revised: 07/31/2017 Document Reviewed: 05/29/2017 Elsevier Patient Education  2020 Elsevier Inc.   Dietary Guidelines to Help Prevent Kidney Stones Kidney stones are deposits of minerals and salts that form inside your kidneys. Your risk of developing kidney stones may be greater depending on your diet, your lifestyle, the medicines you take, and whether you have certain medical conditions. Most people can reduce their chances of developing kidney stones by following the instructions below. Depending on your overall health and the type of kidney stones you tend to develop, your dietitian may give you more specific instructions. What are tips for following this plan? Reading food labels  Choose foods with "no salt added" or "low-salt" labels. Limit your sodium intake to less than 1500 mg per day.  Choose foods with calcium for each meal and snack. Try to eat about 300 mg of calcium at each meal. Foods that contain  200-500 mg of calcium per serving include: ? 8 oz (237 ml) of milk, fortified nondairy milk, and fortified fruit juice. ? 8 oz (237 ml) of kefir, yogurt, and soy yogurt. ? 4 oz (118 ml) of tofu. ? 1 oz of cheese. ? 1 cup (300 g) of dried figs. ? 1 cup (91 g) of cooked broccoli. ? 1-3 oz can of sardines or mackerel.  Most people need 1000 to 1500 mg of calcium each day. Talk to your dietitian about how much calcium is recommended for you. Shopping  Buy plenty of fresh fruits and vegetables. Most people do not need to avoid fruits and vegetables, even if they contain nutrients that may contribute to kidney stones.  When shopping for convenience foods, choose: ? Whole pieces of fruit. ? Premade salads with dressing on the side. ? Low-fat fruit and yogurt smoothies.  Avoid buying frozen meals or prepared deli foods.  Look for foods with live cultures, such as yogurt and kefir. Cooking  Do not add salt to food when cooking. Place a salt shaker on the table and allow each person to add his or her own   salt to taste.  Use vegetable protein, such as beans, textured vegetable protein (TVP), or tofu instead of meat in pasta, casseroles, and soups. Meal planning   Eat less salt, if told by your dietitian. To do this: ? Avoid eating processed or premade food. ? Avoid eating fast food.  Eat less animal protein, including cheese, meat, poultry, or fish, if told by your dietitian. To do this: ? Limit the number of times you have meat, poultry, fish, or cheese each week. Eat a diet free of meat at least 2 days a week. ? Eat only one serving each day of meat, poultry, fish, or seafood. ? When you prepare animal protein, cut pieces into small portion sizes. For most meat and fish, one serving is about the size of one deck of cards.  Eat at least 5 servings of fresh fruits and vegetables each day. To do this: ? Keep fruits and vegetables on hand for snacks. ? Eat 1 piece of fruit or a handful of  berries with breakfast. ? Have a salad and fruit at lunch. ? Have two kinds of vegetables at dinner.  Limit foods that are high in a substance called oxalate. These include: ? Spinach. ? Rhubarb. ? Beets. ? Potato chips and french fries. ? Nuts.  If you regularly take a diuretic medicine, make sure to eat at least 1-2 fruits or vegetables high in potassium each day. These include: ? Avocado. ? Banana. ? Orange, prune, carrot, or tomato juice. ? Baked potato. ? Cabbage. ? Beans and split peas. General instructions   Drink enough fluid to keep your urine clear or pale yellow. This is the most important thing you can do.  Talk to your health care provider and dietitian about taking daily supplements. Depending on your health and the cause of your kidney stones, you may be advised: ? Not to take supplements with vitamin C. ? To take a calcium supplement. ? To take a daily probiotic supplement. ? To take other supplements such as magnesium, fish oil, or vitamin B6.  Take all medicines and supplements as told by your health care provider.  Limit alcohol intake to no more than 1 drink a day for nonpregnant women and 2 drinks a day for men. One drink equals 12 oz of beer, 5 oz of wine, or 1 oz of hard liquor.  Lose weight if told by your health care provider. Work with your dietitian to find strategies and an eating plan that works best for you. What foods are not recommended? Limit your intake of the following foods, or as told by your dietitian. Talk to your dietitian about specific foods you should avoid based on the type of kidney stones and your overall health. Grains Breads. Bagels. Rolls. Baked goods. Salted crackers. Cereal. Pasta. Vegetables Spinach. Rhubarb. Beets. Canned vegetables. Pickles. Olives. Meats and other protein foods Nuts. Nut butters. Large portions of meat, poultry, or fish. Salted or cured meats. Deli meats. Hot dogs. Sausages. Dairy Cheese. Beverages  Regular soft drinks. Regular vegetable juice. Seasonings and other foods Seasoning blends with salt. Salad dressings. Canned soups. Soy sauce. Ketchup. Barbecue sauce. Canned pasta sauce. Casseroles. Pizza. Lasagna. Frozen meals. Potato chips. French fries. Summary  You can reduce your risk of kidney stones by making changes to your diet.  The most important thing you can do is drink enough fluid. You should drink enough fluid to keep your urine clear or pale yellow.  Ask your health care provider or dietitian how   much protein from animal sources you should eat each day, and also how much salt and calcium you should have each day. This information is not intended to replace advice given to you by your health care provider. Make sure you discuss any questions you have with your health care provider. Document Released: 12/13/2010 Document Revised: 12/08/2018 Document Reviewed: 07/29/2016 Elsevier Patient Education  2020 Elsevier Inc.  

## 2019-04-27 NOTE — Progress Notes (Signed)
   04/27/2019 1:50 PM   Tanner Baker 10/21/1950 [id number]  Reason for visit: Follow up hx of nephrolithiasis, hematuria, LUTS, and PCa screening  HPI: I am seeing Tanner Baker in urology clinic today for follow-up of the above.  He has been doing very well since we saw him last year, and his urinary symptoms are well controlled on Flomax.  He also had a negative hematuria work-up in July 2017.  He has past 3 to 4 stones in his lifetime, and never required surgery for stones.  On CT in July 2017 he had a nonobstructing right 3 mm midpole stone.  He denies any family history of prostate cancer.  He denies weak urinary stream, split stream, or dribbling. He denies any stone episodes, hematuria, or worsening urinary symptoms since our last visit.  PSA stable at 0.6(0.6)   ROS: Please see flowsheet from today's date for complete review of systems.  Laboratory Data: PSA 0.6 (0.6)  Pertinent Imaging: KUB today pending  Assessment & Plan:   In summary, Tanner Baker is a 68 year old male we are following for mild lower urinary tract symptoms, history of nephrolithiasis, history of hematuria, and prostate cancer screening.  His urinary symptoms are currently well controlled on Flomax.  PSA is stable today at 0.6, will continue yearly PSA screening.  We reviewed the AUA guidelines that recommend screening at age 18.  We will follow-up his KUB results from today and call with results.  RTC 1 year for symptom check, KUB, and PSA prior  A total of 15 minutes were spent face-to-face with the patient, greater than 50% was spent in patient education, counseling, and coordination of care regarding BPH, PSA screening, nephrolithiasis.   Billey Co, [facility] [address], [place], [place] [address] [phone]

## 2019-04-28 ENCOUNTER — Telehealth: Payer: Self-pay

## 2019-04-28 NOTE — Telephone Encounter (Signed)
-----   Message from Billey Co, MD sent at 04/28/2019  8:28 AM EDT ----- No stones seen on KUB, keep follow up as scheduled  Nickolas Madrid, MD 04/28/2019

## 2019-04-28 NOTE — Telephone Encounter (Signed)
Left pt a detailed mess notifying patient on vmail per DPR

## 2019-09-05 ENCOUNTER — Ambulatory Visit: Payer: Medicare Other | Admitting: Pain Medicine

## 2020-04-26 ENCOUNTER — Encounter: Payer: Self-pay | Admitting: Urology

## 2020-04-26 ENCOUNTER — Ambulatory Visit
Admission: RE | Admit: 2020-04-26 | Discharge: 2020-04-26 | Disposition: A | Discharge status: Home / Self Care | Payer: Medicare Other | Source: Ambulatory Visit | Attending: Urology | Admitting: Urology

## 2020-04-26 ENCOUNTER — Other Ambulatory Visit: Payer: Self-pay

## 2020-04-26 ENCOUNTER — Ambulatory Visit (INDEPENDENT_AMBULATORY_CARE_PROVIDER_SITE_OTHER): Payer: Medicare Other | Admitting: Urology

## 2020-04-26 ENCOUNTER — Ambulatory Visit
Admission: RE | Admit: 2020-04-26 | Discharge: 2020-04-26 | Disposition: A | Discharge status: Home / Self Care | Payer: Medicare Other | Attending: Urology | Admitting: Urology

## 2020-04-26 VITALS — BP 132/78 | HR 56 | Ht 69.0 in | Wt 182.0 lb

## 2020-04-26 DIAGNOSIS — Z125 Encounter for screening for malignant neoplasm of prostate: Secondary | ICD-10-CM

## 2020-04-26 DIAGNOSIS — N401 Enlarged prostate with lower urinary tract symptoms: Secondary | ICD-10-CM

## 2020-04-26 DIAGNOSIS — N200 Calculus of kidney: Secondary | ICD-10-CM | POA: Diagnosis not present

## 2020-04-26 DIAGNOSIS — N138 Other obstructive and reflux uropathy: Secondary | ICD-10-CM | POA: Diagnosis not present

## 2020-04-26 DIAGNOSIS — Z87442 Personal history of urinary calculi: Secondary | ICD-10-CM | POA: Diagnosis present

## 2020-04-26 NOTE — Patient Instructions (Signed)
Dietary Guidelines to Help Prevent Kidney Stones Kidney stones are deposits of minerals and salts that form inside your kidneys. Your risk of developing kidney stones may be greater depending on your diet, your lifestyle, the medicines you take, and whether you have certain medical conditions. Most people can reduce their chances of developing kidney stones by following the instructions below. Depending on your overall health and the type of kidney stones you tend to develop, your dietitian may give you more specific instructions. What are tips for following this plan? Reading food labels  Choose foods with "no salt added" or "low-salt" labels. Limit your sodium intake to less than 1500 mg per day.  Choose foods with calcium for each meal and snack. Try to eat about 300 mg of calcium at each meal. Foods that contain 200-500 mg of calcium per serving include: ? 8 oz (237 ml) of milk, fortified nondairy milk, and fortified fruit juice. ? 8 oz (237 ml) of kefir, yogurt, and soy yogurt. ? 4 oz (118 ml) of tofu. ? 1 oz of cheese. ? 1 cup (300 g) of dried figs. ? 1 cup (91 g) of cooked broccoli. ? 1-3 oz can of sardines or mackerel.  Most people need 1000 to 1500 mg of calcium each day. Talk to your dietitian about how much calcium is recommended for you. Shopping  Buy plenty of fresh fruits and vegetables. Most people do not need to avoid fruits and vegetables, even if they contain nutrients that may contribute to kidney stones.  When shopping for convenience foods, choose: ? Whole pieces of fruit. ? Premade salads with dressing on the side. ? Low-fat fruit and yogurt smoothies.  Avoid buying frozen meals or prepared deli foods.  Look for foods with live cultures, such as yogurt and kefir. Cooking  Do not add salt to food when cooking. Place a salt shaker on the table and allow each person to add his or her own salt to taste.  Use vegetable protein, such as beans, textured vegetable  protein (TVP), or tofu instead of meat in pasta, casseroles, and soups. Meal planning   Eat less salt, if told by your dietitian. To do this: ? Avoid eating processed or premade food. ? Avoid eating fast food.  Eat less animal protein, including cheese, meat, poultry, or fish, if told by your dietitian. To do this: ? Limit the number of times you have meat, poultry, fish, or cheese each week. Eat a diet free of meat at least 2 days a week. ? Eat only one serving each day of meat, poultry, fish, or seafood. ? When you prepare animal protein, cut pieces into small portion sizes. For most meat and fish, one serving is about the size of one deck of cards.  Eat at least 5 servings of fresh fruits and vegetables each day. To do this: ? Keep fruits and vegetables on hand for snacks. ? Eat 1 piece of fruit or a handful of berries with breakfast. ? Have a salad and fruit at lunch. ? Have two kinds of vegetables at dinner.  Limit foods that are high in a substance called oxalate. These include: ? Spinach. ? Rhubarb. ? Beets. ? Potato chips and french fries. ? Nuts.  If you regularly take a diuretic medicine, make sure to eat at least 1-2 fruits or vegetables high in potassium each day. These include: ? Avocado. ? Banana. ? Orange, prune, carrot, or tomato juice. ? Baked potato. ? Cabbage. ? Beans and split   peas. General instructions   Drink enough fluid to keep your urine clear or pale yellow. This is the most important thing you can do.  Talk to your health care provider and dietitian about taking daily supplements. Depending on your health and the cause of your kidney stones, you may be advised: ? Not to take supplements with vitamin C. ? To take a calcium supplement. ? To take a daily probiotic supplement. ? To take other supplements such as magnesium, fish oil, or vitamin B6.  Take all medicines and supplements as told by your health care provider.  Limit alcohol intake to no  more than 1 drink a day for nonpregnant women and 2 drinks a day for men. One drink equals 12 oz of beer, 5 oz of wine, or 1 oz of hard liquor.  Lose weight if told by your health care provider. Work with your dietitian to find strategies and an eating plan that works best for you. What foods are not recommended? Limit your intake of the following foods, or as told by your dietitian. Talk to your dietitian about specific foods you should avoid based on the type of kidney stones and your overall health. Grains Breads. Bagels. Rolls. Baked goods. Salted crackers. Cereal. Pasta. Vegetables Spinach. Rhubarb. Beets. Canned vegetables. Pickles. Olives. Meats and other protein foods Nuts. Nut butters. Large portions of meat, poultry, or fish. Salted or cured meats. Deli meats. Hot dogs. Sausages. Dairy Cheese. Beverages Regular soft drinks. Regular vegetable juice. Seasonings and other foods Seasoning blends with salt. Salad dressings. Canned soups. Soy sauce. Ketchup. Barbecue sauce. Canned pasta sauce. Casseroles. Pizza. Lasagna. Frozen meals. Potato chips. French fries. Summary  You can reduce your risk of kidney stones by making changes to your diet.  The most important thing you can do is drink enough fluid. You should drink enough fluid to keep your urine clear or pale yellow.  Ask your health care provider or dietitian how much protein from animal sources you should eat each day, and also how much salt and calcium you should have each day. This information is not intended to replace advice given to you by your health care provider. Make sure you discuss any questions you have with your health care provider. Document Revised: 12/08/2018 Document Reviewed: 07/29/2016 Elsevier Patient Education  2020 [employer] Inc[employer]  

## 2020-04-26 NOTE — Progress Notes (Signed)
   04/26/2020 10:37 AM   Tanner Baker 1951/07/07 [id number]  Reason for visit: Follow up nephrolithiasis, BPH, PSA screening  HPI: I saw Tanner Baker back in urology clinic today for follow-up of the above issues.  He is a 69 year old healthy male with a history of spontaneously passed stones, hematuria with negative work-up, BPH well controlled on Flomax, and PSA screening with baseline PSA of 0.6.  He denies any problems over the last year.  I personally reviewed his KUB today that shows a stable 3 mm nonobstructing right renal stone.  He denies any flank pain or hematuria.  His urinary symptoms are very well controlled on Flomax and he denies any urinary complaints today.  We discussed general stone prevention strategies including adequate hydration with goal of producing 2.5 L of urine daily, increasing citric acid intake, increasing calcium intake during high oxalate meals, minimizing animal protein, and decreasing salt intake. Information about dietary recommendations given today.   RTC 1 year with KUB, PSA, IPSS prior   Billey Co, MD  [facility] [address], [place], [place] [address] [phone]

## 2020-11-26 ENCOUNTER — Other Ambulatory Visit: Payer: Self-pay

## 2020-11-26 ENCOUNTER — Encounter: Payer: Self-pay | Admitting: Medical Oncology

## 2020-11-26 ENCOUNTER — Inpatient Hospital Stay
Admission: EM | Admit: 2020-11-26 | Discharge: 2020-11-29 | DRG: 481 | Disposition: A | Discharge status: Home Health | Payer: Medicare Other | Attending: Student | Admitting: Student

## 2020-11-26 ENCOUNTER — Emergency Department: Payer: Medicare Other

## 2020-11-26 DIAGNOSIS — Z833 Family history of diabetes mellitus: Secondary | ICD-10-CM

## 2020-11-26 DIAGNOSIS — S72001A Fracture of unspecified part of neck of right femur, initial encounter for closed fracture: Secondary | ICD-10-CM

## 2020-11-26 DIAGNOSIS — F32A Depression, unspecified: Secondary | ICD-10-CM | POA: Diagnosis present

## 2020-11-26 DIAGNOSIS — Z85828 Personal history of other malignant neoplasm of skin: Secondary | ICD-10-CM

## 2020-11-26 DIAGNOSIS — G4733 Obstructive sleep apnea (adult) (pediatric): Secondary | ICD-10-CM | POA: Diagnosis present

## 2020-11-26 DIAGNOSIS — H409 Unspecified glaucoma: Secondary | ICD-10-CM | POA: Diagnosis present

## 2020-11-26 DIAGNOSIS — F419 Anxiety disorder, unspecified: Secondary | ICD-10-CM | POA: Diagnosis present

## 2020-11-26 DIAGNOSIS — Z8249 Family history of ischemic heart disease and other diseases of the circulatory system: Secondary | ICD-10-CM

## 2020-11-26 DIAGNOSIS — G4700 Insomnia, unspecified: Secondary | ICD-10-CM | POA: Diagnosis present

## 2020-11-26 DIAGNOSIS — Z801 Family history of malignant neoplasm of trachea, bronchus and lung: Secondary | ICD-10-CM

## 2020-11-26 DIAGNOSIS — M5416 Radiculopathy, lumbar region: Secondary | ICD-10-CM | POA: Diagnosis present

## 2020-11-26 DIAGNOSIS — W19XXXA Unspecified fall, initial encounter: Secondary | ICD-10-CM

## 2020-11-26 DIAGNOSIS — Z808 Family history of malignant neoplasm of other organs or systems: Secondary | ICD-10-CM

## 2020-11-26 DIAGNOSIS — D62 Acute posthemorrhagic anemia: Secondary | ICD-10-CM | POA: Diagnosis not present

## 2020-11-26 DIAGNOSIS — G894 Chronic pain syndrome: Secondary | ICD-10-CM | POA: Diagnosis present

## 2020-11-26 DIAGNOSIS — Z79899 Other long term (current) drug therapy: Secondary | ICD-10-CM

## 2020-11-26 DIAGNOSIS — M1710 Unilateral primary osteoarthritis, unspecified knee: Secondary | ICD-10-CM | POA: Diagnosis present

## 2020-11-26 DIAGNOSIS — M25551 Pain in right hip: Secondary | ICD-10-CM | POA: Diagnosis present

## 2020-11-26 DIAGNOSIS — W1830XA Fall on same level, unspecified, initial encounter: Secondary | ICD-10-CM | POA: Diagnosis present

## 2020-11-26 DIAGNOSIS — S72141A Displaced intertrochanteric fracture of right femur, initial encounter for closed fracture: Principal | ICD-10-CM | POA: Diagnosis present

## 2020-11-26 DIAGNOSIS — N400 Enlarged prostate without lower urinary tract symptoms: Secondary | ICD-10-CM | POA: Diagnosis present

## 2020-11-26 DIAGNOSIS — G43909 Migraine, unspecified, not intractable, without status migrainosus: Secondary | ICD-10-CM | POA: Diagnosis present

## 2020-11-26 DIAGNOSIS — Z8042 Family history of malignant neoplasm of prostate: Secondary | ICD-10-CM

## 2020-11-26 DIAGNOSIS — I10 Essential (primary) hypertension: Secondary | ICD-10-CM | POA: Diagnosis present

## 2020-11-26 DIAGNOSIS — Z419 Encounter for procedure for purposes other than remedying health state, unspecified: Secondary | ICD-10-CM

## 2020-11-26 DIAGNOSIS — S7291XA Unspecified fracture of right femur, initial encounter for closed fracture: Secondary | ICD-10-CM | POA: Diagnosis present

## 2020-11-26 DIAGNOSIS — Z87891 Personal history of nicotine dependence: Secondary | ICD-10-CM

## 2020-11-26 DIAGNOSIS — M5030 Other cervical disc degeneration, unspecified cervical region: Secondary | ICD-10-CM | POA: Diagnosis present

## 2020-11-26 DIAGNOSIS — F3340 Major depressive disorder, recurrent, in remission, unspecified: Secondary | ICD-10-CM

## 2020-11-26 DIAGNOSIS — Z20822 Contact with and (suspected) exposure to covid-19: Secondary | ICD-10-CM | POA: Diagnosis present

## 2020-11-26 DIAGNOSIS — G44329 Chronic post-traumatic headache, not intractable: Secondary | ICD-10-CM

## 2020-11-26 DIAGNOSIS — M179 Osteoarthritis of knee, unspecified: Secondary | ICD-10-CM | POA: Diagnosis present

## 2020-11-26 LAB — CBC WITH DIFFERENTIAL/PLATELET
Abs Immature Granulocytes: 0.06 10*3/uL (ref 0.00–0.07)
Basophils Absolute: 0.1 10*3/uL (ref 0.0–0.1)
Basophils Relative: 0 %
Eosinophils Absolute: 0.2 10*3/uL (ref 0.0–0.5)
Eosinophils Relative: 1 %
HCT: 37.7 % — ABNORMAL LOW (ref 39.0–52.0)
Hemoglobin: 12.6 g/dL — ABNORMAL LOW (ref 13.0–17.0)
Immature Granulocytes: 1 %
Lymphocytes Relative: 14 %
Lymphs Abs: 1.6 10*3/uL (ref 0.7–4.0)
MCH: 30.7 pg (ref 26.0–34.0)
MCHC: 33.4 g/dL (ref 30.0–36.0)
MCV: 91.7 fL (ref 80.0–100.0)
Monocytes Absolute: 0.9 10*3/uL (ref 0.1–1.0)
Monocytes Relative: 8 %
Neutro Abs: 8.7 10*3/uL — ABNORMAL HIGH (ref 1.7–7.7)
Neutrophils Relative %: 76 %
Platelets: 179 10*3/uL (ref 150–400)
RBC: 4.11 MIL/uL — ABNORMAL LOW (ref 4.22–5.81)
RDW: 13.3 % (ref 11.5–15.5)
WBC: 11.5 10*3/uL — ABNORMAL HIGH (ref 4.0–10.5)
nRBC: 0 % (ref 0.0–0.2)

## 2020-11-26 LAB — COMPREHENSIVE METABOLIC PANEL
ALT: 15 U/L (ref 0–44)
AST: 21 U/L (ref 15–41)
Albumin: 4.1 g/dL (ref 3.5–5.0)
Alkaline Phosphatase: 50 U/L (ref 38–126)
Anion gap: 7 (ref 5–15)
BUN: 15 mg/dL (ref 8–23)
CO2: 23 mmol/L (ref 22–32)
Calcium: 8.9 mg/dL (ref 8.9–10.3)
Chloride: 110 mmol/L (ref 98–111)
Creatinine, Ser: 1.06 mg/dL (ref 0.61–1.24)
GFR, Estimated: >60 mL/min (ref >60)
Glucose, Bld: 101 mg/dL — ABNORMAL HIGH (ref 70–99)
Potassium: 3.9 mmol/L (ref 3.5–5.1)
Sodium: 140 mmol/L (ref 135–145)
Total Bilirubin: 0.9 mg/dL (ref 0.3–1.2)
Total Protein: 7.1 g/dL (ref 6.5–8.1)

## 2020-11-26 LAB — RESP PANEL BY RT-PCR (FLU A&B, COVID) ARPGX2
Influenza A by PCR: NEGATIVE
Influenza B by PCR: NEGATIVE
SARS Coronavirus 2 by RT PCR: NEGATIVE

## 2020-11-26 LAB — PROTIME-INR
INR: 1.1 (ref 0.8–1.2)
Prothrombin Time: 13.8 seconds (ref 11.4–15.2)

## 2020-11-26 LAB — TYPE AND SCREEN
ABO/RH(D): A POS
Antibody Screen: NEGATIVE

## 2020-11-26 MED ORDER — ASCORBIC ACID 500 MG PO TABS
500.0000 mg | ORAL_TABLET | Freq: Every day | ORAL | Status: DC
  Administered 2020-11-29: 500 mg via ORAL
  Filled 2020-11-26: qty 1

## 2020-11-26 MED ORDER — FENTANYL CITRATE (PF) 100 MCG/2ML IJ SOLN
50.0000 ug | Freq: Once | INTRAMUSCULAR | Status: AC
  Administered 2020-11-26: 50 ug via INTRAVENOUS
  Filled 2020-11-26: qty 2

## 2020-11-26 MED ORDER — SUMATRIPTAN SUCCINATE 50 MG PO TABS
100.0000 mg | ORAL_TABLET | ORAL | Status: DC | PRN
  Filled 2020-11-26: qty 2

## 2020-11-26 MED ORDER — BUSPIRONE HCL 10 MG PO TABS
15.0000 mg | ORAL_TABLET | Freq: Three times a day (TID) | ORAL | Status: DC
  Administered 2020-11-26 – 2020-11-29 (×6): 15 mg via ORAL
  Filled 2020-11-26: qty 2
  Filled 2020-11-26: qty 3
  Filled 2020-11-26: qty 2
  Filled 2020-11-26: qty 3
  Filled 2020-11-26: qty 2
  Filled 2020-11-26: qty 3
  Filled 2020-11-26: qty 2
  Filled 2020-11-26: qty 3
  Filled 2020-11-26: qty 2

## 2020-11-26 MED ORDER — PROPRANOLOL HCL 20 MG PO TABS
40.0000 mg | ORAL_TABLET | Freq: Two times a day (BID) | ORAL | Status: DC
  Administered 2020-11-26 – 2020-11-29 (×5): 40 mg via ORAL
  Filled 2020-11-26 (×5): qty 2

## 2020-11-26 MED ORDER — LACTATED RINGERS IV BOLUS
500.0000 mL | Freq: Once | INTRAVENOUS | Status: AC
  Administered 2020-11-26: 500 mL via INTRAVENOUS

## 2020-11-26 MED ORDER — TAMSULOSIN HCL 0.4 MG PO CAPS
0.4000 mg | ORAL_CAPSULE | Freq: Every day | ORAL | Status: DC
  Administered 2020-11-29: 0.4 mg via ORAL
  Filled 2020-11-26: qty 1

## 2020-11-26 MED ORDER — HYDROCHLOROTHIAZIDE 25 MG PO TABS
25.0000 mg | ORAL_TABLET | Freq: Every day | ORAL | Status: DC
  Administered 2020-11-29: 25 mg via ORAL
  Filled 2020-11-26: qty 1

## 2020-11-26 MED ORDER — TRAZODONE HCL 100 MG PO TABS
100.0000 mg | ORAL_TABLET | Freq: Every evening | ORAL | Status: DC | PRN
  Administered 2020-11-26 – 2020-11-28 (×2): 100 mg via ORAL
  Filled 2020-11-26 (×2): qty 1

## 2020-11-26 MED ORDER — ACETAMINOPHEN 325 MG PO TABS: 650.0000 mg | ORAL_TABLET | Freq: Four times a day (QID) | ORAL | Status: DC | PRN

## 2020-11-26 MED ORDER — FLUTICASONE PROPIONATE 50 MCG/ACT NA SUSP: 1.0000 | Freq: Every day | NASAL | Status: DC

## 2020-11-26 MED ORDER — FENTANYL CITRATE (PF) 100 MCG/2ML IJ SOLN
50.0000 ug | INTRAMUSCULAR | Status: DC | PRN
  Administered 2020-11-26 – 2020-11-27 (×3): 50 ug via INTRAVENOUS
  Filled 2020-11-26 (×4): qty 2

## 2020-11-26 MED ORDER — VITAMIN D 25 MCG (1000 UNIT) PO TABS
1000.0000 [IU] | ORAL_TABLET | Freq: Every morning | ORAL | Status: DC
  Administered 2020-11-29: 1000 [IU] via ORAL
  Filled 2020-11-26: qty 1

## 2020-11-26 MED ORDER — MORPHINE SULFATE (PF) 2 MG/ML IV SOLN
0.5000 mg | INTRAVENOUS | Status: DC | PRN
Start: 2020-11-26 — End: 2020-11-27
  Administered 2020-11-26 (×3): 0.5 mg via INTRAVENOUS
  Filled 2020-11-26 (×3): qty 1

## 2020-11-26 MED ORDER — PAROXETINE HCL 30 MG PO TABS
30.0000 mg | ORAL_TABLET | Freq: Every day | ORAL | Status: DC
Start: 2020-11-27 — End: 2020-11-29
  Administered 2020-11-29: 30 mg via ORAL
  Filled 2020-11-26 (×3): qty 1
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
## 2020-11-26 NOTE — Plan of Care (Signed)
New admission

## 2020-11-26 NOTE — ED Notes (Signed)
RN will give medications when they are verified
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
## 2020-11-27 ENCOUNTER — Inpatient Hospital Stay: Payer: Medicare Other | Admitting: Anesthesiology

## 2020-11-27 ENCOUNTER — Encounter
Admission: EM | Disposition: A | Discharge status: Home Health | Payer: Self-pay | Source: Home / Self Care | Attending: Student

## 2020-11-27 ENCOUNTER — Inpatient Hospital Stay: Payer: Medicare Other

## 2020-11-27 ENCOUNTER — Encounter: Payer: Self-pay | Admitting: Internal Medicine

## 2020-11-27 HISTORY — PX: INTRAMEDULLARY (IM) NAIL INTERTROCHANTERIC: SHX5875

## 2020-11-27 LAB — SURGICAL PCR SCREEN
MRSA, PCR: NEGATIVE
Staphylococcus aureus: POSITIVE — AB

## 2020-11-27 LAB — HIV ANTIBODY (ROUTINE TESTING W REFLEX): HIV Screen 4th Generation wRfx: NONREACTIVE

## 2020-11-27 SURGERY — FIXATION, FRACTURE, INTERTROCHANTERIC, WITH INTRAMEDULLARY ROD
Anesthesia: Spinal | Laterality: Right

## 2020-11-27 MED ORDER — SODIUM CHLORIDE 0.9 % IV SOLN
INTRAVENOUS | Status: DC | PRN
  Administered 2020-11-27: 25 ug/min via INTRAVENOUS

## 2020-11-27 MED ORDER — MUPIROCIN 2 % EX OINT
1.0000 "application " | TOPICAL_OINTMENT | Freq: Two times a day (BID) | CUTANEOUS | Status: DC
  Administered 2020-11-27 – 2020-11-29 (×4): 1 via NASAL
  Filled 2020-11-27: qty 22

## 2020-11-27 MED ORDER — KETAMINE HCL 50 MG/5ML IJ SOSY
PREFILLED_SYRINGE | INTRAMUSCULAR | Status: AC
  Filled 2020-11-27: qty 5

## 2020-11-27 MED ORDER — FENTANYL CITRATE (PF) 100 MCG/2ML IJ SOLN: 25.0000 ug | INTRAMUSCULAR | Status: DC | PRN

## 2020-11-27 MED ORDER — NEOMYCIN-POLYMYXIN B GU 40-200000 IR SOLN
Status: AC
  Filled 2020-11-27: qty 3

## 2020-11-27 MED ORDER — MIDAZOLAM HCL 2 MG/2ML IJ SOLN
INTRAMUSCULAR | Status: AC
  Filled 2020-11-27: qty 2

## 2020-11-27 MED ORDER — METOCLOPRAMIDE HCL 5 MG/ML IJ SOLN
5.0000 mg | Freq: Three times a day (TID) | INTRAMUSCULAR | Status: DC | PRN
  Administered 2020-11-28 (×2): 10 mg via INTRAVENOUS
  Filled 2020-11-27 (×2): qty 2

## 2020-11-27 MED ORDER — DEXAMETHASONE SODIUM PHOSPHATE 10 MG/ML IJ SOLN
INTRAMUSCULAR | Status: DC | PRN
  Administered 2020-11-27: 10 mg via INTRAVENOUS

## 2020-11-27 MED ORDER — KETAMINE HCL 10 MG/ML IJ SOLN
INTRAMUSCULAR | Status: DC | PRN
  Administered 2020-11-27: 20 mg via INTRAVENOUS
  Administered 2020-11-27: 10 mg via INTRAVENOUS
  Administered 2020-11-27: 20 mg via INTRAVENOUS

## 2020-11-27 MED ORDER — TOPIRAMATE 25 MG PO TABS
50.0000 mg | ORAL_TABLET | Freq: Two times a day (BID) | ORAL | Status: DC
  Administered 2020-11-27 – 2020-11-29 (×3): 50 mg via ORAL
  Filled 2020-11-27 (×5): qty 2

## 2020-11-27 MED ORDER — ACETAMINOPHEN 10 MG/ML IV SOLN
INTRAVENOUS | Status: DC | PRN
  Administered 2020-11-27: 1000 mg via INTRAVENOUS

## 2020-11-27 MED ORDER — MORPHINE SULFATE (PF) 2 MG/ML IV SOLN: 1.0000 mg | Freq: Once | INTRAVENOUS | Status: AC

## 2020-11-27 MED ORDER — METHOCARBAMOL 500 MG PO TABS
500.0000 mg | ORAL_TABLET | Freq: Four times a day (QID) | ORAL | Status: DC | PRN
  Administered 2020-11-28 – 2020-11-29 (×2): 500 mg via ORAL
  Filled 2020-11-27 (×2): qty 1

## 2020-11-27 MED ORDER — PROPOFOL 500 MG/50ML IV EMUL
INTRAVENOUS | Status: AC
  Filled 2020-11-27: qty 50

## 2020-11-27 MED ORDER — GLYCOPYRROLATE 0.2 MG/ML IJ SOLN
INTRAMUSCULAR | Status: DC | PRN
  Administered 2020-11-27: .2 mg via INTRAVENOUS

## 2020-11-27 MED ORDER — PHENYLEPHRINE HCL (PRESSORS) 10 MG/ML IV SOLN
INTRAVENOUS | Status: DC | PRN
  Administered 2020-11-27: 100 ug via INTRAVENOUS
  Administered 2020-11-27: 200 ug via INTRAVENOUS

## 2020-11-27 MED ORDER — LACTATED RINGERS IV SOLN: INTRAVENOUS | Status: DC | PRN

## 2020-11-27 MED ORDER — KETOROLAC TROMETHAMINE 15 MG/ML IJ SOLN
7.5000 mg | Freq: Four times a day (QID) | INTRAMUSCULAR | Status: AC
  Administered 2020-11-27 – 2020-11-28 (×4): 7.5 mg via INTRAVENOUS
  Filled 2020-11-27 (×4): qty 1

## 2020-11-27 MED ORDER — MORPHINE SULFATE (PF) 2 MG/ML IV SOLN
INTRAVENOUS | Status: AC
  Administered 2020-11-27: 1 mg via INTRAVENOUS
  Filled 2020-11-27: qty 1

## 2020-11-27 MED ORDER — SUGAMMADEX SODIUM 500 MG/5ML IV SOLN
INTRAVENOUS | Status: DC | PRN
  Administered 2020-11-27: 400 mg via INTRAVENOUS

## 2020-11-27 MED ORDER — FENTANYL CITRATE (PF) 100 MCG/2ML IJ SOLN
INTRAMUSCULAR | Status: AC
  Filled 2020-11-27: qty 2

## 2020-11-27 MED ORDER — BUPIVACAINE LIPOSOME 1.3 % IJ SUSP
INTRAMUSCULAR | Status: DC | PRN
  Administered 2020-11-27: 20 mL

## 2020-11-27 MED ORDER — SODIUM CHLORIDE 0.9 % IV BOLUS
500.0000 mL | Freq: Once | INTRAVENOUS | Status: AC
  Administered 2020-11-27: 500 mL via INTRAVENOUS

## 2020-11-27 MED ORDER — CHLORHEXIDINE GLUCONATE CLOTH 2 % EX PADS
6.0000 | MEDICATED_PAD | Freq: Every day | CUTANEOUS | Status: DC
  Administered 2020-11-27 – 2020-11-29 (×2): 6 via TOPICAL

## 2020-11-27 MED ORDER — ONDANSETRON HCL 4 MG/2ML IJ SOLN: 4.0000 mg | Freq: Once | INTRAMUSCULAR | Status: DC | PRN

## 2020-11-27 MED ORDER — BUPIVACAINE HCL (PF) 0.5 % IJ SOLN
INTRAMUSCULAR | Status: AC
  Filled 2020-11-27: qty 30

## 2020-11-27 MED ORDER — SODIUM CHLORIDE 0.9 % IV SOLN: INTRAVENOUS | Status: DC

## 2020-11-27 MED ORDER — DOCUSATE SODIUM 100 MG PO CAPS
100.0000 mg | ORAL_CAPSULE | Freq: Two times a day (BID) | ORAL | Status: DC
  Administered 2020-11-27 – 2020-11-29 (×3): 100 mg via ORAL
  Filled 2020-11-27 (×3): qty 1

## 2020-11-27 MED ORDER — SUGAMMADEX SODIUM 500 MG/5ML IV SOLN: INTRAVENOUS | Status: DC | PRN

## 2020-11-27 MED ORDER — ONDANSETRON HCL 4 MG/2ML IJ SOLN
INTRAMUSCULAR | Status: DC | PRN
  Administered 2020-11-27: 4 mg via INTRAVENOUS

## 2020-11-27 MED ORDER — BISACODYL 10 MG RE SUPP: 10.0000 mg | Freq: Every day | RECTAL | Status: DC | PRN

## 2020-11-27 MED ORDER — METHOCARBAMOL 1000 MG/10ML IJ SOLN
500.0000 mg | Freq: Four times a day (QID) | INTRAVENOUS | Status: DC | PRN
  Filled 2020-11-27: qty 5

## 2020-11-27 MED ORDER — SUCCINYLCHOLINE CHLORIDE 20 MG/ML IJ SOLN
INTRAMUSCULAR | Status: DC | PRN
  Administered 2020-11-27: 100 mg via INTRAVENOUS

## 2020-11-27 MED ORDER — HYDROMORPHONE HCL 1 MG/ML IJ SOLN: 0.5000 mg | INTRAMUSCULAR | Status: DC | PRN

## 2020-11-27 MED ORDER — MIDAZOLAM HCL 5 MG/5ML IJ SOLN
INTRAMUSCULAR | Status: DC | PRN
  Administered 2020-11-27: 2 mg via INTRAVENOUS

## 2020-11-27 MED ORDER — CEFAZOLIN SODIUM-DEXTROSE 2-4 GM/100ML-% IV SOLN
2.0000 g | Freq: Four times a day (QID) | INTRAVENOUS | Status: AC
  Administered 2020-11-27 – 2020-11-28 (×3): 2 g via INTRAVENOUS
  Filled 2020-11-27 (×4): qty 100

## 2020-11-27 MED ORDER — EPHEDRINE SULFATE 50 MG/ML IJ SOLN
INTRAMUSCULAR | Status: DC | PRN
  Administered 2020-11-27: 5 mg via INTRAVENOUS

## 2020-11-27 MED ORDER — ONDANSETRON HCL 4 MG/2ML IJ SOLN
4.0000 mg | Freq: Four times a day (QID) | INTRAMUSCULAR | Status: DC | PRN
  Administered 2020-11-28 (×2): 4 mg via INTRAVENOUS
  Filled 2020-11-27 (×2): qty 2

## 2020-11-27 MED ORDER — OXYCODONE HCL 5 MG PO TABS
10.0000 mg | ORAL_TABLET | ORAL | Status: DC | PRN
  Administered 2020-11-28: 15 mg via ORAL
  Filled 2020-11-27: qty 2
  Filled 2020-11-27: qty 3

## 2020-11-27 MED ORDER — HYDROMORPHONE HCL 1 MG/ML IJ SOLN
INTRAMUSCULAR | Status: AC
  Filled 2020-11-27: qty 1

## 2020-11-27 MED ORDER — TRANEXAMIC ACID-NACL 1000-0.7 MG/100ML-% IV SOLN
INTRAVENOUS | Status: AC
  Administered 2020-11-27: 1000 mg via INTRAVENOUS
  Filled 2020-11-27: qty 100

## 2020-11-27 MED ORDER — ENOXAPARIN SODIUM 40 MG/0.4ML ~~LOC~~ SOLN
40.0000 mg | SUBCUTANEOUS | Status: DC
  Administered 2020-11-28 – 2020-11-29 (×2): 40 mg via SUBCUTANEOUS
  Filled 2020-11-27 (×2): qty 0.4

## 2020-11-27 MED ORDER — VASOPRESSIN 20 UNIT/ML IV SOLN
INTRAVENOUS | Status: DC | PRN
  Administered 2020-11-27: 1 [IU] via INTRAVENOUS

## 2020-11-27 MED ORDER — TRANEXAMIC ACID-NACL 1000-0.7 MG/100ML-% IV SOLN: 1000.0000 mg | Freq: Once | INTRAVENOUS | Status: AC

## 2020-11-27 MED ORDER — ACETAMINOPHEN 10 MG/ML IV SOLN
INTRAVENOUS | Status: AC
  Filled 2020-11-27: qty 100

## 2020-11-27 MED ORDER — OXYCODONE HCL 5 MG PO TABS
5.0000 mg | ORAL_TABLET | ORAL | Status: DC | PRN
  Administered 2020-11-28: 10 mg via ORAL
  Administered 2020-11-29: 5 mg via ORAL
  Filled 2020-11-27: qty 2

## 2020-11-27 MED ORDER — BUPIVACAINE LIPOSOME 1.3 % IJ SUSP
INTRAMUSCULAR | Status: AC
  Filled 2020-11-27: qty 20

## 2020-11-27 MED ORDER — SODIUM CHLORIDE 0.9 % IR SOLN
Status: DC | PRN
  Administered 2020-11-27: 800 mL

## 2020-11-27 MED ORDER — ACETAMINOPHEN 500 MG PO TABS
1000.0000 mg | ORAL_TABLET | Freq: Four times a day (QID) | ORAL | Status: AC
  Administered 2020-11-27 – 2020-11-28 (×2): 1000 mg via ORAL
  Filled 2020-11-27 (×3): qty 2

## 2020-11-27 MED ORDER — PROPOFOL 10 MG/ML IV BOLUS
INTRAVENOUS | Status: DC | PRN
  Administered 2020-11-27 (×6): 20 mg via INTRAVENOUS

## 2020-11-27 MED ORDER — ROCURONIUM BROMIDE 100 MG/10ML IV SOLN
INTRAVENOUS | Status: DC | PRN
  Administered 2020-11-27: 20 mg via INTRAVENOUS
  Administered 2020-11-27: 30 mg via INTRAVENOUS
  Administered 2020-11-27: 20 mg via INTRAVENOUS

## 2020-11-27 MED ORDER — FLEET ENEMA 7-19 GM/118ML RE ENEM: 1.0000 | ENEMA | Freq: Once | RECTAL | Status: DC | PRN

## 2020-11-27 MED ORDER — HYDROMORPHONE HCL 1 MG/ML IJ SOLN
INTRAMUSCULAR | Status: DC | PRN
  Administered 2020-11-27: .5 mg via INTRAVENOUS

## 2020-11-27 MED ORDER — ONDANSETRON HCL 4 MG PO TABS
4.0000 mg | ORAL_TABLET | Freq: Four times a day (QID) | ORAL | Status: DC | PRN
  Administered 2020-11-28: 4 mg via ORAL
  Filled 2020-11-27: qty 1

## 2020-11-27 MED ORDER — SENNOSIDES-DOCUSATE SODIUM 8.6-50 MG PO TABS: 1.0000 | ORAL_TABLET | Freq: Every evening | ORAL | Status: DC | PRN

## 2020-11-27 MED ORDER — METOCLOPRAMIDE HCL 10 MG PO TABS: 5.0000 mg | ORAL_TABLET | Freq: Three times a day (TID) | ORAL | Status: DC | PRN

## 2020-11-27 MED ORDER — BUPIVACAINE HCL (PF) 0.5 % IJ SOLN
INTRAMUSCULAR | Status: DC | PRN
  Administered 2020-11-27: 30 mL

## 2020-11-27 MED ORDER — TRAMADOL HCL 50 MG PO TABS
50.0000 mg | ORAL_TABLET | Freq: Four times a day (QID) | ORAL | Status: DC
Start: 2020-11-27 — End: 2020-11-29
  Administered 2020-11-28 – 2020-11-29 (×5): 50 mg via ORAL
  Filled 2020-11-27 (×6): qty 1

## 2020-11-27 SURGICAL SUPPLY — 49 items
"PENCIL ELECTRO HAND CTR " (MISCELLANEOUS) ×1 IMPLANT
BIT DRILL INTERTAN LAG SCREW (BIT) ×1 IMPLANT
BIT DRILL LONG 4.0 (BIT) IMPLANT
BLADE SURG 15 STRL LF DISP TIS (BLADE) ×1 IMPLANT
BLADE SURG 15 STRL SS (BLADE) ×1
CHLORAPREP W/TINT 26 (MISCELLANEOUS) ×2 IMPLANT
COVER WAND RF STERILE (DRAPES) ×2 IMPLANT
DRAPE 3/4 80X56 (DRAPES) ×2 IMPLANT
DRAPE U-SHAPE 47X51 STRL (DRAPES) ×4 IMPLANT
DRILL BIT LONG 4.0 (BIT) ×2
DRSG AQUACEL AG 3.5X4 (GAUZE/BANDAGES/DRESSINGS) ×3 IMPLANT
ELECT REM PT RETURN 9FT ADLT (ELECTROSURGICAL) ×2
ELECTRODE REM PT RTRN 9FT ADLT (ELECTROSURGICAL) ×1 IMPLANT
GLOVE SRG 8 PF TXTR STRL LF DI (GLOVE) ×1 IMPLANT
GLOVE SURG SYN 7.5  E (GLOVE) ×1
GLOVE SURG SYN 7.5 E (GLOVE) ×1 IMPLANT
GLOVE SURG SYN 7.5 PF PI (GLOVE) ×1 IMPLANT
GLOVE SURG UNDER POLY LF SZ8 (GLOVE) ×1
GOWN STRL REUS W/ TWL LRG LVL3 (GOWN DISPOSABLE) ×1 IMPLANT
GOWN STRL REUS W/ TWL XL LVL3 (GOWN DISPOSABLE) ×1 IMPLANT
GOWN STRL REUS W/TWL LRG LVL3 (GOWN DISPOSABLE) ×1
GOWN STRL REUS W/TWL XL LVL3 (GOWN DISPOSABLE) ×1
GUIDE PIN 3.2X343 (PIN) ×2
GUIDE PIN 3.2X343MM (PIN) ×2
GUIDE ROD 3.0 (MISCELLANEOUS) ×4
KIT PATIENT CARE HANA TABLE (KITS) ×2 IMPLANT
KIT TURNOVER KIT A (KITS) ×2 IMPLANT
MANIFOLD NEPTUNE II (INSTRUMENTS) ×2 IMPLANT
MAT ABSORB  FLUID 56X50 GRAY (MISCELLANEOUS) ×2
MAT ABSORB FLUID 56X50 GRAY (MISCELLANEOUS) ×2 IMPLANT
NAIL INTERTAN 10X18 130D 10S (Nail) ×1 IMPLANT
NDL FILTER BLUNT 18X1 1/2 (NEEDLE) ×1 IMPLANT
NEEDLE FILTER BLUNT 18X 1/2SAF (NEEDLE) ×1
NEEDLE FILTER BLUNT 18X1 1/2 (NEEDLE) ×1 IMPLANT
NEEDLE HYPO 22GX1.5 SAFETY (NEEDLE) ×2 IMPLANT
NS IRRIG 1000ML POUR BTL (IV SOLUTION) ×2 IMPLANT
PACK HIP COMPR (MISCELLANEOUS) ×2 IMPLANT
PENCIL ELECTRO HAND CTR (MISCELLANEOUS) ×2 IMPLANT
PIN GUIDE 3.2X343MM (PIN) IMPLANT
ROD GUIDE 3.0 (MISCELLANEOUS) IMPLANT
SCREW LAG COMPR KIT 115/110 (Screw) ×1 IMPLANT
SCREW LAG HIP COMPRESSION (Screw) ×2 IMPLANT
SCREW TRIGEN LOW PROF 5.0X32.5 (Screw) ×3 IMPLANT
STAPLER SKIN PROX 35W (STAPLE) ×2 IMPLANT
SUT VIC AB 0 CT1 36 (SUTURE) ×1 IMPLANT
SUT VIC AB 2-0 CT2 27 (SUTURE) ×2 IMPLANT
SYR 10ML LL (SYRINGE) ×2 IMPLANT
SYR 30ML LL (SYRINGE) ×2 IMPLANT
TAPE CLOTH 3X10 WHT NS LF (GAUZE/BANDAGES/DRESSINGS) ×3 IMPLANT

## 2020-11-27 NOTE — Anesthesia Procedure Notes (Signed)
Procedure Name: Intubation Performed by: Kelton Pillar, CRNA Pre-anesthesia Checklist: Patient identified, Emergency Drugs available, Suction available and Patient being monitored Patient Re-evaluated:Patient Re-evaluated prior to induction Oxygen Delivery Method: Circle system utilized Preoxygenation: Pre-oxygenation with 100% oxygen Induction Type: IV induction Ventilation: Mask ventilation without difficulty Laryngoscope Size: McGraph and 3 Grade View: Grade I Tube type: Oral Tube size: 7.0 mm Number of attempts: 1 Airway Equipment and Method: Stylet and Oral airway Placement Confirmation: ETT inserted through vocal cords under direct vision,  positive ETCO2,  breath sounds checked- equal and bilateral and CO2 detector Secured at: 24 cm Tube secured with: Tape Dental Injury: Teeth and Oropharynx as per pre-operative assessment
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
## 2020-11-27 NOTE — Progress Notes (Signed)
OT Cancellation Note  Patient Details Name: Tanner Baker MRN: [id number] DOB: 03/28/1951   Cancelled Treatment:    Reason Eval/Treat Not Completed: Other (comment). Consult received, chart reviewed. Pt noted with R femur fracture. Per orthopedic surgery consult, pt scheduled for R hip cephalomedullary nailing today, 11/27/20. Will complete current order. Please re-consult OT s/p surgery and updated precautions and weightbearing status.   Hanley Hays, MPH, MS, OTR/L ascom [phone] 11/27/20, 8:26 AM

## 2020-11-27 NOTE — Anesthesia Procedure Notes (Signed)
Spinal  Patient location during procedure: OR Reason for block: surgical anesthesia Preanesthetic Checklist Completed: patient identified, IV checked, site marked, risks and benefits discussed, surgical consent, monitors and equipment checked, pre-op evaluation and timeout performed Spinal Block Patient position: right lateral decubitus Prep: Betadine Patient monitoring: heart rate, cardiac monitor, continuous pulse ox and blood pressure Approach: midline Location: L5-S1 Injection technique: single-shot Needle Needle type: Sprotte and Spinocan  Needle gauge: 22 G Needle length: 9 cm Additional Notes 4 attempts at L5S1 and then L4L5 without success.  No paresthesia and tolerated well but decision to convert to GA secondary to inability to access CSF space. JA

## 2020-11-27 NOTE — Progress Notes (Signed)
PT Cancellation Note  Patient Details Name: Desmen Schoffstall MRN: [id number] DOB: 01-25-1951   Cancelled Treatment:    Reason Eval/Treat Not Completed: Patient at procedure or test/unavailable. Patient is anticipated to undergo surgery today for right intertrochanteric hip fracture. PT will hold today and follow up tomorrow or as appropriate after surgery completed and weight bearing status determined.   Minna Merritts, PT, MPT  Percell Locus 11/27/2020, 10:08 AM

## 2020-11-27 NOTE — Transfer of Care (Signed)
Immediate Anesthesia Transfer of Care Note  Patient: Tanner Baker  Procedure(s) Performed: INTRAMEDULLARY (IM) NAIL INTERTROCHANTRIC (Right )  Patient Location: PACU  Anesthesia Type:General  Level of Consciousness: awake, drowsy and patient cooperative  Airway & Oxygen Therapy: Patient Spontanous Breathing and Patient connected to face mask oxygen  Post-op Assessment: Report given to RN and Post -op Vital signs reviewed and stable  Post vital signs: Reviewed and stable  Last Vitals:  Vitals Value Taken Time  BP 98/81 11/27/20 1506  Temp 36.8 C 11/27/20 1504  Pulse 86 11/27/20 1508  Resp 14 11/27/20 1508  SpO2 88 % 11/27/20 1508  Vitals shown include unvalidated device data.  Last Pain:  Vitals:   11/27/20 1137  TempSrc:   PainSc: 10-Worst pain ever         Complications: No complications documented.

## 2020-11-27 NOTE — H&P (Signed)
H&P reviewed. No significant changes noted.  
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
## 2020-11-27 NOTE — Anesthesia Preprocedure Evaluation (Signed)
Anesthesia Evaluation  Patient identified by MRN, date of birth, ID band Patient awake    Reviewed: Allergy & Precautions, H&P , NPO status , Patient's Chart, lab work & pertinent test results, reviewed documented beta blocker date and time   Airway Mallampati: III   Neck ROM: full    Dental  (+) Poor Dentition   Pulmonary sleep apnea and Continuous Positive Airway Pressure Ventilation , former smoker,    Pulmonary exam normal        Cardiovascular Exercise Tolerance: Poor hypertension, On Medications negative cardio ROS Normal cardiovascular exam Rhythm:regular Rate:Normal     Neuro/Psych  Headaches, PSYCHIATRIC DISORDERS Anxiety Depression  Neuromuscular disease    GI/Hepatic negative GI ROS, Neg liver ROS,   Endo/Other  negative endocrine ROS  Renal/GU Renal disease  negative genitourinary   Musculoskeletal   Abdominal   Peds  Hematology negative hematology ROS (+)   Anesthesia Other Findings Past Medical History: No date: Anxiety No date: Cancer ([place])     Comment:  skin cancer No date: Chronic pain syndrome No date: DDD (degenerative disc disease), cervical No date: Depression No date: Depression No date: Glaucoma No date: Heartburn No date: Hypertension No date: Kidney stone No date: Kidney stones No date: Migraine No date: Migraine No date: Post-traumatic headache No date: Sleep apnea No date: Traumatic arthritis Past Surgical History: 1995: arm surg; Right 1994: COSMETIC SURGERY     Comment:  all of teeth No date: SKIN CANCER EXCISION No date: TONSILLECTOMY No date: WISDOM TOOTH EXTRACTION BMI    Body Mass Index: 26.70 kg/m     Reproductive/Obstetrics negative OB ROS                             Anesthesia Physical Anesthesia Plan  ASA: III and emergent  Anesthesia Plan: Spinal   Post-op Pain Management:    Induction:   PONV Risk Score and Plan:    Airway Management Planned:   Additional Equipment:   Intra-op Plan:   Post-operative Plan:   Informed Consent: I have reviewed the patients History and Physical, chart, labs and discussed the procedure including the risks, benefits and alternatives for the proposed anesthesia with the patient or authorized representative who has indicated his/her understanding and acceptance.     Dental Advisory Given  Plan Discussed with: CRNA  Anesthesia Plan Comments:         Anesthesia Quick Evaluation

## 2020-11-27 NOTE — Op Note (Signed)
DATE OF SURGERY: 11/27/2020  PREOPERATIVE DIAGNOSIS: Right intertrochanteric hip fracture  POSTOPERATIVE DIAGNOSIS: Right intertrochanteric hip fracture  PROCEDURE: Intramedullary nailing of Right femur with cephalomedullary device  SURGEON: Cato Mulligan, MD  ANESTHESIA: spinal  EBL: 100 cc  IVF: per anesthesia record  COMPONENTS:  [employer] Trigen Intertan Short Nail: 10x182mm; 167mm lag screw with 141mm compression screw; 5x 32.84mm distal cortical interlocking screw  INDICATIONS: Tanner Baker is a 70 y.o. male who sustained an intertrochanteric fracture after a fall. Risks and benefits of intramedullary nailing were explained to the patient and/or family . Risks include but are not limited to bleeding, infection, injury to tissues, nerves, vessels, nonunion/malunion, hardware failure, limb length discrepancy/hip rotation mismatch and risks of anesthesia. The patient and/or family understand these risks, have completed an informed consent, and wish to proceed.   PROCEDURE:  The patient was brought into the operating room. After administering anesthesia, the patient was placed in the supine position on the Hana table. The uninjured leg was placed in an extended position while the injured lower extremity was placed in longitudinal traction. The fracture was reduced using longitudinal traction and internal rotation. The adequacy of reduction was verified fluoroscopically in AP and lateral projections. The lateral aspect of the hip and thigh were prepped with ChloraPrep solution before being draped sterilely. Preoperative IV antibiotics were administered. A timeout was performed to verify the appropriate surgical site, patient, and procedure.    The greater trochanter was identified and an approximately 6 cm incision was made about 3 fingerbreadths above the tip of the greater trochanter. The incision was carried down through the subcutaneous tissues to expose the gluteal fascia. This  was split the length of the incision, providing access to the tip of the trochanter. Under fluoroscopic guidance, a guidewire was drilled through the tip of the trochanter into the proximal metaphysis to the level of the lesser trochanter. After verifying its position fluoroscopically in AP and lateral projections, it was overreamed with the opening reamer to the level of the lesser trochanter. A guidewire was passed down through the femoral canal to the supracondylar region. The guidewire was overreamed sequentially using the flexible reamers. The nail was selected and advanced to the appropriate depth as verified fluoroscopically.    The guide system for the lag screw was positioned and advanced through an approximately 5cm incision over the lateral aspect of the proximal femur. A bone hook was utilized to pull the neck fragment laterally and proximally to improve alignment at the medial calcar. The guidewire was drilled up through the femoral nail and into the femoral neck to rest within 5 mm of subchondral bone. After verifying its position in the femoral neck and head in both AP and lateral projections, the guidewire was measured and appropriate sized lag screw was selected.  The channel for the compression screw was drilled and antirotation bar was placed.  Lag screw was drilled and placed in appropriate position.  Compression screw was then placed.  Appropriate compression was achieved.  The set screw was locked in place. Again, the adequacy of hardware position and fracture reduction was verified fluoroscopically in AP and lateral projections.   Attention was then turned to the distal interlocking screw in the diaphysis. Using a targeted assembly, a stab incision was made and hole was drilled through the nail. An interlocking screw was placed with excellent purchase.  Appropriate screw position was verified fluoroscopically in AP and lateral projections.   The wounds were irrigated thoroughly with  sterile saline solution. Local anesthetic was injected into the wounds. Deep fascia was closed with 0-Vicryl. The subcutaneous tissues were closed using 2-0 Vicryl interrupted sutures. The skin was closed using staples. Sterile occlusive dressings were applied to all wounds. The patient was then transferred to the recovery room in satisfactory condition.   POSTOPERATIVE PLAN: The patient will be WBAT on the operative extremity. Lovenox 40mg /day x 4 weeks to start on POD#1. Perioperative IV antibiotics x 24 hours. PT/OT on POD#1.

## 2020-11-28 LAB — CBC
HCT: 26.8 % — ABNORMAL LOW (ref 39.0–52.0)
Hemoglobin: 8.9 g/dL — ABNORMAL LOW (ref 13.0–17.0)
MCH: 30.8 pg (ref 26.0–34.0)
MCHC: 33.2 g/dL (ref 30.0–36.0)
MCV: 92.7 fL (ref 80.0–100.0)
Platelets: 142 10*3/uL — ABNORMAL LOW (ref 150–400)
RBC: 2.89 MIL/uL — ABNORMAL LOW (ref 4.22–5.81)
RDW: 13.4 % (ref 11.5–15.5)
WBC: 21.8 10*3/uL — ABNORMAL HIGH (ref 4.0–10.5)
nRBC: 0 % (ref 0.0–0.2)

## 2020-11-28 LAB — IRON AND TIBC
Iron: 30 ug/dL — ABNORMAL LOW (ref 45–182)
Saturation Ratios: 15 % — ABNORMAL LOW (ref 17.9–39.5)
TIBC: 206 ug/dL — ABNORMAL LOW (ref 250–450)
UIBC: 176 ug/dL

## 2020-11-28 LAB — BASIC METABOLIC PANEL
Anion gap: 6 (ref 5–15)
BUN: 22 mg/dL (ref 8–23)
CO2: 20 mmol/L — ABNORMAL LOW (ref 22–32)
Calcium: 8.5 mg/dL — ABNORMAL LOW (ref 8.9–10.3)
Chloride: 110 mmol/L (ref 98–111)
Creatinine, Ser: 1.17 mg/dL (ref 0.61–1.24)
GFR, Estimated: >60 mL/min (ref >60)
Glucose, Bld: 131 mg/dL — ABNORMAL HIGH (ref 70–99)
Potassium: 4.2 mmol/L (ref 3.5–5.1)
Sodium: 136 mmol/L (ref 135–145)

## 2020-11-28 LAB — VITAMIN D 25 HYDROXY (VIT D DEFICIENCY, FRACTURES): Vit D, 25-Hydroxy: 70.9 ng/mL (ref 30–100)

## 2020-11-28 LAB — PHOSPHORUS: Phosphorus: 2.9 mg/dL (ref 2.5–4.6)

## 2020-11-28 LAB — MAGNESIUM: Magnesium: 1.9 mg/dL (ref 1.7–2.4)

## 2020-11-28 LAB — VITAMIN B12: Vitamin B-12: 412 pg/mL (ref 180–914)

## 2020-11-28 LAB — FOLATE: Folate: 16 ng/mL (ref >5.9)

## 2020-11-28 MED ORDER — OXYCODONE HCL 5 MG PO TABS: 5.0000 mg | ORAL_TABLET | ORAL | 0 refills | Status: DC | PRN

## 2020-11-28 MED ORDER — ONDANSETRON HCL 4 MG PO TABS: 4.0000 mg | ORAL_TABLET | Freq: Four times a day (QID) | ORAL | 0 refills | Status: DC | PRN

## 2020-11-28 MED ORDER — TRAMADOL HCL 50 MG PO TABS: 50.0000 mg | ORAL_TABLET | Freq: Four times a day (QID) | ORAL | 0 refills | Status: DC

## 2020-11-28 MED ORDER — FENTANYL 50 MCG/HR TD PT72
1.0000 | MEDICATED_PATCH | TRANSDERMAL | Status: DC
Start: 2020-11-28 — End: 2020-11-29
  Administered 2020-11-28: 1 via TRANSDERMAL
  Filled 2020-11-28: qty 1

## 2020-11-28 MED ORDER — ENOXAPARIN SODIUM 40 MG/0.4ML ~~LOC~~ SOLN: 40.0000 mg | SUBCUTANEOUS | 0 refills | Status: DC

## 2020-11-28 NOTE — Evaluation (Signed)
Physical Therapy Evaluation Patient Details Name: Tanner Baker MRN: [id number] DOB: 04/25/1951 Today's Date: 11/28/2020   History of Present Illness  presented to ER secondary to fall with acute onset R hip pain; admitted for management of intertrochanteric fracture of R hip, s/p IM nailing (11/27/20), WBAT.  Clinical Impression  Patient resting in bed upon arrival to session, endorses mild nausea but managed with meds issued prior to session.  Rates pain 0-1/10 at rest; 3/10 with mobility and WBing.  Demonstrates fair/good isolated strength (grossly 3-/5) and ROM (approx 50-60% normal range) to R LE, end-ranges limited by post-op soreness.  Currently able to complete bed mobility with min assist; sit/stand, basic transfers and gait (44') with RW, min assist. Demonstrates 3-point, step go gait pattern; fair loading/stance time R LE; very slow and deliberate, but no buckling or LOB. Min cuing for walker position, management for optimal safety and performance.  Pain 3/10 with gait efforts.  Anticipate consistent progression towards goals throughout course of rehab. Would benefit from skilled PT to address above deficits and promote optimal return to PLOF.; Recommend transition to HHPT upon discharge from acute hospitalization.      Follow Up Recommendations Home health PT    Equipment Recommendations  Rolling walker with 5" wheels;3in1 (PT)    Recommendations for Other Services       Precautions / Restrictions Precautions Precautions: Fall Restrictions Weight Bearing Restrictions: Yes RLE Weight Bearing: Weight bearing as tolerated      Mobility  Bed Mobility Overal bed mobility: Needs Assistance Bed Mobility: Supine to Sit     Supine to sit: Min assist          Transfers Overall transfer level: Needs assistance Equipment used: Rolling walker (2 wheeled) Transfers: Sit to/from Stand Sit to Stand: Min assist         General transfer comment: heavy use of UEs to  assist; tends to rotate body and shift weight to L LE (to decrease loading to R LE).  consistent cuing for hand placement  Ambulation/Gait Ambulation/Gait assistance: Min assist Gait Distance (Feet): 40 Feet Assistive device: Rolling walker (2 wheeled)       General Gait Details: 3-point, step go gait pattern; fair loading/stance time R LE; very slow and deliberate, but no buckling or LOB. Min cuing for walker position, management for optimal safety and performance.  Pain 3/10 with gait efforts.  Stairs            Wheelchair Mobility    Modified Rankin (Stroke Patients Only)       Balance Overall balance assessment: Needs assistance Sitting-balance support: No upper extremity supported;Feet supported Sitting balance-Leahy Scale: Good     Standing balance support: Bilateral upper extremity supported Standing balance-Leahy Scale: Fair                               Pertinent Vitals/Pain Pain Assessment: 0-10 Pain Score: 3  Pain Location: R hip Pain Descriptors / Indicators: Aching;Grimacing Pain Intervention(s): Limited activity within patient's tolerance;Monitored during session;Premedicated before session;Repositioned    Home Living Family/patient expects to be discharged to:: Private residence Living Arrangements: Spouse/significant other Available Help at Discharge: Family;Available 24 hours/day Type of Home: Apartment Home Access: Stairs to enter Entrance Stairs-Rails: Left Entrance Stairs-Number of Steps: 2 Home Layout: One level Home Equipment: None      Prior Function Level of Independence: Independent         Comments: Indep with ADLs, household  and community mobilization without assist device; denies fall history.     Hand Dominance   Dominant Hand: Right    Extremity/Trunk Assessment   Upper Extremity Assessment Upper Extremity Assessment: Overall WFL for tasks assessed    Lower Extremity Assessment Lower Extremity  Assessment: Generalized weakness (R hip and knee grossly 3-/5 throughout, end-range limited by pain; otherwise, bilat LEs grossly 4+ to 5/5 throughout)       Communication   Communication: No difficulties  Cognition Arousal/Alertness: Awake/alert Behavior During Therapy: WFL for tasks assessed/performed Overall Cognitive Status: Within Functional Limits for tasks assessed                                        General Comments      Exercises Other Exercises Other Exercises: Supine R LE therex, 1x10, act assist ROM: ankle pumps, quad sets, SAQs, heel slides, hip abduct/adduct.  Good isolated strength and ROM; minimal increase in pain with activity. Other Exercises: Reviewed role of PT and progressive mobility, goals of rehab, WBing precautions and functional implications; patient voiced understanding/agreement.   Assessment/Plan    PT Assessment Patient needs continued PT services  PT Problem List Decreased range of motion;Decreased activity tolerance;Decreased balance;Decreased mobility;Decreased strength;Decreased coordination;Decreased knowledge of use of DME;Decreased safety awareness;Decreased knowledge of precautions;Pain       PT Treatment Interventions DME instruction;Gait training;Stair training;Functional mobility training;Therapeutic activities;Therapeutic exercise;Balance training;Patient/family education    PT Goals (Current goals can be found in the Care Plan section)  Acute Rehab PT Goals Patient Stated Goal: "to do what i need to do so I can go home!" PT Goal Formulation: With patient Time For Goal Achievement: 12/12/20 Potential to Achieve Goals: Good    Frequency BID   Barriers to discharge        Co-evaluation               AM-PAC PT "6 Clicks" Mobility  Outcome Measure Help needed turning from your back to your side while in a flat bed without using bedrails?: None Help needed moving from lying on your back to sitting on the side  of a flat bed without using bedrails?: A Little Help needed moving to and from a bed to a chair (including a wheelchair)?: A Little Help needed standing up from a chair using your arms (e.g., wheelchair or bedside chair)?: A Little Help needed to walk in hospital room?: A Little Help needed climbing 3-5 steps with a railing? : A Little 6 Click Score: 19    End of Session Equipment Utilized During Treatment: Gait belt Activity Tolerance: Patient tolerated treatment well Patient left: in chair;with call bell/phone within reach (RN/CNA to place alarm; not available in room during session.  Patient aware of need to call for assist; needs in reach) Nurse Communication: Mobility status PT Visit Diagnosis: Muscle weakness (generalized) (M62.81);Difficulty in walking, not elsewhere classified (R26.2);Pain Pain - Right/Left: Right Pain - part of body: Hip    Time: 8127-5170 PT Time Calculation (min) (ACUTE ONLY): 23 min   Charges:   PT Evaluation $PT Eval Moderate Complexity: 1 Mod PT Treatments $Therapeutic Exercise: 8-22 mins        Damany Eastman H. Owens Shark, PT, DPT, NCS 11/28/20, 10:05 AM [phone]

## 2020-11-28 NOTE — Progress Notes (Addendum)
PT Cancellation Note  Patient Details Name: Tanner Baker MRN: [id number] DOB: 26-Jun-1951   Cancelled Treatment:    Reason Eval/Treat Not Completed: Pain limiting ability to participate (Treatment session attempted.  Patient currently reporting significant pain at this time; RN aware, meds requested.  Visibly uncomfortable, grimacing, writhing in bed at times.  Declines participation with session at this time; will re-attempt at later time/date as medically appropriate and available.)   [name] H. Owens Shark, PT, DPT, NCS 11/28/20, 3:07 PM [phone]

## 2020-11-28 NOTE — TOC Initial Note (Signed)
Transition of Care [facility]) - Initial/Assessment Note    Patient Details  Name: Tanner Baker MRN: [id number] Date of Birth: 02/03/1951  Transition of Care [facility]) CM/SW Contact:    Pete Pelt, RN Phone Number: 11/28/2020, 3:40 PM  Clinical Narrative:    TOC in to see patient at bedside.  Patient plans to go home with home health, wife is at home, patient has no concerns about getting to appointments or getting meds, wife is home.  DME orders placed with [employer].  [facility] reviewing patient case and will get back to [facility].  TOC contact information given to patient, TOC to follow through discharge.            Expected Discharge Plan: [place] Barriers to Discharge: Continued Medical Work up   Patient Goals and CMS Choice     Choice offered to / list presented to : NA  Expected Discharge Plan and Services Expected Discharge Plan: [place] In-house Referral: NA   Post Acute Care Choice: [place] arrangements for the past 2 months: Single Family Home                 DME Arranged: 3-N-1,Walker rolling DME Agency: [facility] Date DME Agency Contacted: 11/28/20   Representative spoke with at DME Agency: [place]:  ([facility] reviewing)     Time HH Agency Contacted: 1500    Prior Living Arrangements/Services Living arrangements for the past 2 months: [place] Lives with:: Self,Spouse Patient language and need for interpreter reviewed:: Yes Do you feel safe going back to the place where you live?: Yes      Need for Family Participation in Patient Care: Yes (Comment) Care giver support system in place?: Yes (comment)   Criminal Activity/Legal Involvement Pertinent to Current Situation/Hospitalization: No - Comment as needed  Activities of Daily Living Home Assistive Devices/Equipment: None ADL Screening (condition at time of admission) Patient's cognitive ability adequate to safely complete daily activities?:  Yes Is the patient deaf or have difficulty hearing?: No Does the patient have difficulty seeing, even when wearing glasses/contacts?: No Does the patient have difficulty concentrating, remembering, or making decisions?: No Patient able to express need for assistance with ADLs?: Yes Does the patient have difficulty dressing or bathing?: No Independently performs ADLs?: Yes (appropriate for developmental age) Does the patient have difficulty walking or climbing stairs?: No Weakness of Legs: None Weakness of Arms/Hands: None  Permission Sought/Granted   Permission granted to share information with : Yes, Verbal Permission Granted     Permission granted to share info w AGENCY: Home Health        Emotional Assessment Appearance:: Appears stated age Attitude/Demeanor/Rapport: Gracious,Engaged Affect (typically observed): Pleasant,Appropriate Orientation: : Oriented to Self,Oriented to Place,Oriented to  Time,Oriented to Situation Alcohol / Substance Use: Not Applicable Psych Involvement: No (comment)  Admission diagnosis:  Fall [W19.XXXA] Femur fracture, right ([place]) [S72.91XA] Closed fracture of right hip, initial encounter [facility]) [S72.001A] Patient Active Problem List   Diagnosis Date Noted  . Femur fracture, right ([place]) 11/26/2020  . Chronic pain associated with significant psychosocial dysfunction 03/21/2016  . Narrowing of intervertebral disc space 03/21/2016  . BP (high blood pressure) 03/21/2016  . Headache, migraine 03/21/2016  . Acute traumatic arthritis 03/21/2016  . Encounter for general adult medical examination without abnormal findings 09/24/2015  . 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 DDD (degenerative disc disease), lumbar 01/24/2015  . Facet syndrome, lumbar  01/24/2015  . Sacroiliac joint dysfunction 01/24/2015  . Lumbar radiculopathy, chronic 01/24/2015  . DJD (degenerative joint disease) of knee 01/24/2015  . Major  depression in remission ([place]) 08/30/2014  . 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  . H/O melanoma in situ 12/08/2013  . H/O neoplasm 12/08/2013  . Anxiety 10/12/2013  . Neuropathic pain 04/27/2013  . Arthritis, degenerative 02/02/2013  . Arthritis of knee, degenerative 02/02/2013  . Chronic infection of sinus 01/26/2013  . Sinus infection 01/26/2013  . Encounter for other administrative examinations 08/16/2012  . Hay fever 06/28/2012  . Antritis chronic 06/28/2012  . 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 Error, refractive, myopia 02/10/2012  . Actinic keratoses 06/17/2011  . Idiopathic localized osteoarthropathy 05/13/2011  . Clinical depression 05/23/2009  . Benign hypertension 05/23/2009  . LBP (low back pain) 05/23/2009  . Apnea, sleep 05/23/2009   PCP:  Kirk Ruths, MD Pharmacy:   [facility] [address], [place] [facility] [place] [address] Phone: [phone] Fax: [phone]  [facility] OUTPATIENT PHARM [place], [place] - [address] [place] [address] Phone: [phone] Fax: [phone]     Social Determinants of Health (SDOH) Interventions    Readmission Risk Interventions No flowsheet data found.
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
## 2020-11-28 NOTE — Discharge Instructions (Signed)
INSTRUCTIONS AFTER Surgery  o Remove items at home which could result in a fall. This includes throw rugs or furniture in walking pathways o ICE to the affected joint every three hours while awake for 30 minutes at a time, for at least the first 3-5 days, and then as needed for pain and swelling.  Continue to use ice for pain and swelling. You may notice swelling that will progress down to the foot and ankle.  This is normal after surgery.  Elevate your leg when you are not up walking on it.   o Continue to use the breathing machine you got in the hospital (incentive spirometer) which will help keep your temperature down.  It is common for your temperature to cycle up and down following surgery, especially at night when you are not up moving around and exerting yourself.  The breathing machine keeps your lungs expanded and your temperature down.   DIET:  As you were doing prior to hospitalization, we recommend a well-balanced diet.  DRESSING / WOUND CARE / SHOWERING  No showering.  Keep the dressing clean and dry.  Change the dressing as needed.  Follow-up with [facility] clinic orthopedics in 2 weeks for right hip x-rays and staple removal  ACTIVITY  o Increase activity slowly as tolerated, but follow the weight bearing instructions below.   o No driving for 6 weeks or until further direction given by your physician.  You cannot drive while taking narcotics.  o No lifting or carrying greater than 10 lbs. until further directed by your surgeon. o Avoid periods of inactivity such as sitting longer than an hour when not asleep. This helps prevent blood clots.  o You may return to work once you are authorized by your doctor.     WEIGHT BEARING  Weightbearing as tolerated on the right.   EXERCISES Gait training and ambulation training as well as strengthening.  Activities of daily living.  CONSTIPATION  Constipation is defined medically as fewer than three stools per week and severe  constipation as less than one stool per week.  Even if you have a regular bowel pattern at home, your normal regimen is likely to be disrupted due to multiple reasons following surgery.  Combination of anesthesia, postoperative narcotics, change in appetite and fluid intake all can affect your bowels.   YOU MUST use at least one of the following options; they are listed in order of increasing strength to get the job done.  They are all available over the counter, and you may need to use some, POSSIBLY even all of these options:    Drink plenty of fluids (prune juice may be helpful) and high fiber foods Colace 100 mg by mouth twice a day  Senokot for constipation as directed and as needed Dulcolax (bisacodyl), take with full glass of water  Miralax (polyethylene glycol) once or twice a day as needed.  If you have tried all these things and are unable to have a bowel movement in the first 3-4 days after surgery call either your surgeon or your primary doctor.    If you experience loose stools or diarrhea, hold the medications until you stool forms back up.  If your symptoms do not get better within 1 week or if they get worse, check with your doctor.  If you experience "the worst abdominal pain ever" or develop nausea or vomiting, please contact the office immediately for further recommendations for treatment.   ITCHING:  If you experience itching with  your medications, try taking only a single pain pill, or even half a pain pill at a time.  You can also use Benadryl over the counter for itching or also to help with sleep.   TED HOSE STOCKINGS:  Use stockings on both legs until for at least 2 weeks or as directed by physician office. They may be removed at night for sleeping.  MEDICATIONS:  See your medication summary on the "After Visit Summary" that nursing will review with you.  You may have some home medications which will be placed on hold until you complete the course of blood thinner  medication.  It is important for you to complete the blood thinner medication as prescribed.  PRECAUTIONS:  If you experience chest pain or shortness of breath - call 911 immediately for transfer to the hospital emergency department.   If you develop a fever greater that 101 F, purulent drainage from wound, increased redness or drainage from wound, foul odor from the wound/dressing, or calf pain - CONTACT YOUR SURGEON.                                                   FOLLOW-UP APPOINTMENTS:  If you do not already have a post-op appointment, please call the office for an appointment to be seen by your surgeon.  Guidelines for how soon to be seen are listed in your "After Visit Summary", but are typically between 1-4 weeks after surgery.  OTHER INSTRUCTIONS:     MAKE SURE YOU:  . Understand these instructions.  . Get help right away if you are not doing well or get worse.    Thank you for letting us be a part of your medical care team.  It is a privilege we respect greatly.  We hope these instructions will help you stay on track for a fast and full recovery!

## 2020-11-28 NOTE — Progress Notes (Signed)
  Subjective: 1 Day Post-Op Procedure(s) (LRB): INTRAMEDULLARY (IM) NAIL INTERTROCHANTRIC (Right) Patient reports pain as moderate.   Patient is well, and has had no acute complaints or problems Plan is to go Home versus rehab after hospital stay. Negative for chest pain and shortness of breath Fever: no Gastrointestinal: Positive for nausea and vomiting after receiving pain medication  Objective: Vital signs in last 24 hours: Temp:  [97.7 F (36.5 C)-98.8 F (37.1 C)] 98.2 F (36.8 C) (03/30 0454) Pulse Rate:  [51-66] 62 (03/30 0454) Resp:  [12-22] 16 (03/30 0454) BP: (95-139)/(46-81) 124/70 (03/30 0454) SpO2:  [94 %-100 %] 99 % (03/30 0454) Weight:  [82 kg] 82 kg (03/29 1137)  Intake/Output from previous day:  Intake/Output Summary (Last 24 hours) at 11/28/2020 0624 Last data filed at 11/28/2020 0454 Gross per 24 hour  Intake 2096.08 ml  Output 900 ml  Net 1196.08 ml    Intake/Output this shift: Total I/O In: 596.1 [P.O.:240; I.V.:356.1] Out: 500 [Urine:500]  Labs: Recent Labs    11/26/20 1237 11/28/20 0456  HGB 12.6* 8.9*   Recent Labs    11/26/20 1237 11/28/20 0456  WBC 11.5* 21.8*  RBC 4.11* 2.89*  HCT 37.7* 26.8*  PLT 179 142*   Recent Labs    11/26/20 1237 11/28/20 0456  NA 140 136  K 3.9 4.2  CL 110 110  CO2 23 20*  BUN 15 22  CREATININE 1.06 1.17  GLUCOSE 101* 131*  CALCIUM 8.9 8.5*   Recent Labs    11/26/20 1237  INR 1.1     EXAM General - Patient is Alert and Oriented Extremity - Neurovascular intact Sensation intact distally Dorsiflexion/Plantar flexion intact Compartment soft Dressing/Incision - clean, dry, no drainage Motor Function - intact, moving foot and toes well on exam.   Past Medical History:  Diagnosis Date  . Anxiety   . Cancer ([place])    skin cancer  . Chronic pain syndrome   . DDD (degenerative disc disease), cervical   . Depression   . Depression   . Glaucoma   . Heartburn   . Hypertension   . Kidney  stone   . Kidney stones   . Migraine   . Migraine   . Post-traumatic headache   . Sleep apnea   . Traumatic arthritis     Assessment/Plan: 1 Day Post-Op Procedure(s) (LRB): INTRAMEDULLARY (IM) NAIL INTERTROCHANTRIC (Right) Principal Problem:   Femur fracture, right (HCC) Active Problems:   Lumbar radiculopathy, chronic   Anxiety   Chronic pain associated with significant psychosocial dysfunction   Clinical depression   Benign hypertension   Chronic post-traumatic headache   Arthritis of knee, degenerative  Estimated body mass index is 26.7 kg/m as calculated from the following:   Height as of this encounter: 5\' 9"  (1.753 m).   Weight as of this encounter: 82 kg. Advance diet Up with therapy D/C IV fluids   Discharge planning.  Home versus rehab.  Follow-up with [facility] clinic orthopedics in 2 weeks for staple removal and x-rays of the right hip.  DVT Prophylaxis - Lovenox, Foot Pumps and TED hose Weight-Bearing as tolerated to right leg  Reche Dixon, PA-C Orthopaedic Surgery 11/28/2020, 6:24 AM
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
## 2020-11-28 NOTE — Anesthesia Postprocedure Evaluation (Signed)
Anesthesia Post Note  Patient: Tanner Baker  Procedure(s) Performed: INTRAMEDULLARY (IM) NAIL INTERTROCHANTRIC (Right )  Patient location during evaluation: Nursing Unit Anesthesia Type: Spinal Level of consciousness: oriented and awake and alert Pain management: pain level controlled Vital Signs Assessment: post-procedure vital signs reviewed and stable Respiratory status: spontaneous breathing and respiratory function stable Cardiovascular status: blood pressure returned to baseline and stable Postop Assessment: no headache, no backache, no apparent nausea or vomiting and patient able to bend at knees Anesthetic complications: no   No complications documented.   Last Vitals:  Vitals:   11/28/20 0454 11/28/20 0808  BP: 124/70 (!) 120/56  Pulse: 62 (!) 56  Resp: 16 16  Temp: 36.8 C 36.8 C  SpO2: 99% 98%    Last Pain:  Vitals:   11/28/20 0454  TempSrc: Oral  PainSc:                  Alison Stalling

## 2020-11-29 ENCOUNTER — Encounter: Payer: Self-pay | Admitting: Orthopedic Surgery

## 2020-11-29 LAB — BASIC METABOLIC PANEL
Anion gap: 4 — ABNORMAL LOW (ref 5–15)
BUN: 22 mg/dL (ref 8–23)
CO2: 24 mmol/L (ref 22–32)
Calcium: 8.2 mg/dL — ABNORMAL LOW (ref 8.9–10.3)
Chloride: 112 mmol/L — ABNORMAL HIGH (ref 98–111)
Creatinine, Ser: 1.18 mg/dL (ref 0.61–1.24)
GFR, Estimated: >60 mL/min (ref >60)
Glucose, Bld: 103 mg/dL — ABNORMAL HIGH (ref 70–99)
Potassium: 3.8 mmol/L (ref 3.5–5.1)
Sodium: 140 mmol/L (ref 135–145)

## 2020-11-29 LAB — CBC
HCT: 23.7 % — ABNORMAL LOW (ref 39.0–52.0)
Hemoglobin: 7.9 g/dL — ABNORMAL LOW (ref 13.0–17.0)
MCH: 31 pg (ref 26.0–34.0)
MCHC: 33.3 g/dL (ref 30.0–36.0)
MCV: 92.9 fL (ref 80.0–100.0)
Platelets: 117 10*3/uL — ABNORMAL LOW (ref 150–400)
RBC: 2.55 MIL/uL — ABNORMAL LOW (ref 4.22–5.81)
RDW: 13.6 % (ref 11.5–15.5)
WBC: 12.4 10*3/uL — ABNORMAL HIGH (ref 4.0–10.5)
nRBC: 0 % (ref 0.0–0.2)

## 2020-11-29 MED ORDER — ADULT MULTIVITAMIN W/MINERALS CH
1.0000 | ORAL_TABLET | Freq: Every day | ORAL | Status: DC
  Administered 2020-11-29: 1 via ORAL
  Filled 2020-11-29: qty 1

## 2020-11-29 MED ORDER — ENOXAPARIN SODIUM 40 MG/0.4ML ~~LOC~~ SOLN: 40.0000 mg | SUBCUTANEOUS | 0 refills | Status: DC

## 2020-11-29 MED ORDER — AMOXICILLIN 500 MG PO CAPS
1000.0000 mg | ORAL_CAPSULE | Freq: Once | ORAL | Status: AC
  Administered 2020-11-29: 1000 mg via ORAL
  Filled 2020-11-29: qty 2

## 2020-11-29 MED ORDER — ADULT MULTIVITAMIN W/MINERALS CH: 1.0000 | ORAL_TABLET | Freq: Every day | ORAL | 2 refills | Status: AC

## 2020-11-29 MED ORDER — POLYSACCHARIDE IRON COMPLEX 150 MG PO CAPS
150.0000 mg | ORAL_CAPSULE | Freq: Every day | ORAL | Status: DC
  Filled 2020-11-29: qty 1

## 2020-11-29 MED ORDER — DULCOLAX 5 MG PO TBEC: 10.0000 mg | DELAYED_RELEASE_TABLET | Freq: Every day | ORAL | 0 refills | Status: DC | PRN

## 2020-11-29 MED ORDER — POLYSACCHARIDE IRON COMPLEX 150 MG PO CAPS: 150.0000 mg | ORAL_CAPSULE | Freq: Every day | ORAL | 2 refills | Status: DC
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
## 2020-11-29 NOTE — Plan of Care (Signed)

## 2020-11-29 NOTE — TOC Transition Note (Signed)
Transition of Care [facility]) - CM/SW Discharge Note   Patient Details  Name: Nobel Brar MRN: [id number] Date of Birth: 03/29/1951  Transition of Care [facility] CM/SW Contact:  Pete Pelt, RN Phone Number: 11/29/2020, 1:14 PM   Clinical Narrative:   TOC at bedside.  Patient ready for discharge.  Still has no concerns at home, set up with [facility] home [facility], Helene Kelp aware of discharge,  3 n 1 delivered by [employer].  Lovenox cost ~$100 per TOC benefits, patient advised.  Wife and brother are on their way to pick up patient.  No questions or concerns posed.  TOC signing off      Barriers to Discharge: Continued Medical Work up   Patient Goals and CMS Choice     Choice offered to / list presented to : NA  Discharge Placement                       Discharge Plan and Services In-house Referral: NA   Post Acute Care Choice: Home Health          DME Arranged: 3-N-1,Walker rolling DME Agency: [employer] Date DME Agency Contacted: 11/28/20   Representative spoke with at DME Agency: [place]:  ([facility] reviewing)     Time [place]: 1500    Social Determinants of Health (SDOH) Interventions     Readmission Risk Interventions No flowsheet data found.

## 2020-11-29 NOTE — TOC Benefit Eligibility Note (Signed)
Transition of Care [facility]) Benefit Eligibility Note    Patient Details  Name: Tanner Baker MRN: [id number] Date of Birth: 1951-06-02   Medication/Dose: Enoxaparin 40 mg daily x 14 days  Covered?: Yes  Tier: Other (Tier 4)  Prescription Coverage Preferred Pharmacy: [facility] in [place] with Person/Company/Phone Number:: Jeanie Cooks, [phone]  Co-Pay: $100 for maximum qty of 24  Prior Approval: No     Additional Notes: Lovenox not on formulary    [place] Phone Number: 11/29/2020, 10:46 AM

## 2020-11-29 NOTE — Progress Notes (Signed)
Therapy found a tic on the pt's back. MD Val Riles notified. RN gave antibiotic and removed tic and sent it to the lab, per orders. Discharge instructions reviewed with the pt and wife. Lovenox education given, medication schedule, f/u appointments, and activity, wound care and s/s of infection. Iv dc'd tip intact. Pt awaiting on a wheelchair for dc to personal vehicle.
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
## 2020-11-29 NOTE — Progress Notes (Signed)
  Subjective: 2 Days Post-Op Procedure(s) (LRB): INTRAMEDULLARY (IM) NAIL INTERTROCHANTRIC (Right) Patient reports pain as mild to moderate.   Patient is well, and has had no acute complaints or problems Plan is to go Home after hospital stay. Negative for chest pain and shortness of breath Fever: no Gastrointestinal: Negative for nausea and vomiting   Objective: Vital signs in last 24 hours: Temp:  [97.9 F (36.6 C)-98.7 F (37.1 C)] 97.9 F (36.6 C) (03/31 0345) Pulse Rate:  [56-68] 56 (03/31 0345) Resp:  [16-18] 17 (03/31 0345) BP: (109-122)/(56-72) 122/66 (03/31 0345) SpO2:  [98 %-100 %] 100 % (03/31 0345)  Intake/Output from previous day:  Intake/Output Summary (Last 24 hours) at 11/29/2020 0653 Last data filed at 11/29/2020 0119 Gross per 24 hour  Intake 1250.52 ml  Output 400 ml  Net 850.52 ml    Intake/Output this shift: Total I/O In: 240 [P.O.:240] Out: 400 [Urine:400]  Labs: Recent Labs    11/26/20 1237 11/28/20 0456  HGB 12.6* 8.9*   Recent Labs    11/26/20 1237 11/28/20 0456  WBC 11.5* 21.8*  RBC 4.11* 2.89*  HCT 37.7* 26.8*  PLT 179 142*   Recent Labs    11/26/20 1237 11/28/20 0456  NA 140 136  K 3.9 4.2  CL 110 110  CO2 23 20*  BUN 15 22  CREATININE 1.06 1.17  GLUCOSE 101* 131*  CALCIUM 8.9 8.5*   Recent Labs    11/26/20 1237  INR 1.1     EXAM General - Patient is Alert and Oriented Extremity - Neurovascular intact Sensation intact distally Dorsiflexion/Plantar flexion intact Compartment soft Dressing/Incision - clean, dry, no drainage Motor Function - intact, moving foot and toes well on exam.   Past Medical History:  Diagnosis Date  . Anxiety   . Cancer ([place])    skin cancer  . Chronic pain syndrome   . DDD (degenerative disc disease), cervical   . Depression   . Depression   . Glaucoma   . Heartburn   . Hypertension   . Kidney stone   . Kidney stones   . Migraine   . Migraine   . Post-traumatic headache   .  Sleep apnea   . Traumatic arthritis     Assessment/Plan: 2 Days Post-Op Procedure(s) (LRB): INTRAMEDULLARY (IM) NAIL INTERTROCHANTRIC (Right) Principal Problem:   Femur fracture, right ([facility]) Active Problems:   Lumbar radiculopathy, chronic   Anxiety   Chronic pain associated with significant psychosocial dysfunction   Clinical depression   Benign hypertension   Chronic post-traumatic headache   Arthritis of knee, degenerative  Estimated body mass index is 26.7 kg/m as calculated from the following:   Height as of this encounter: 5\' 9"  (1.753 m).   Weight as of this encounter: 82 kg. Advance diet Up with therapy D/C IV fluids   Acute blood loss anemia.  Hemoglobin 8.9.  Elevated white count at 21.8 this AM.  Afebrile.  Discharge planning.  Home with home health physical therapy. Follow-up with [facility] clinic orthopedics in 2 weeks for staple removal and x-rays of the right hip.  DVT Prophylaxis - Lovenox, Foot Pumps and TED hose Weight-Bearing as tolerated to right leg  Reche Dixon, PA-C Orthopaedic Surgery 11/29/2020, 6:53 AM

## 2020-11-30 LAB — ARTHROPOD IDENTIFICATION

## 2020-12-03 ENCOUNTER — Encounter: Payer: Self-pay | Admitting: Orthopedic Surgery

## 2021-04-26 ENCOUNTER — Other Ambulatory Visit: Payer: Medicare Other

## 2021-04-26 ENCOUNTER — Other Ambulatory Visit: Payer: Self-pay

## 2021-04-26 DIAGNOSIS — Z125 Encounter for screening for malignant neoplasm of prostate: Secondary | ICD-10-CM

## 2021-04-27 LAB — PSA: Prostate Specific Ag, Serum: 0.5 ng/mL (ref 0.0–4.0)

## 2021-05-02 ENCOUNTER — Ambulatory Visit: Payer: Medicare Other | Admitting: Urology

## 2021-05-02 ENCOUNTER — Ambulatory Visit
Admission: RE | Admit: 2021-05-02 | Discharge: 2021-05-02 | Disposition: A | Discharge status: Home / Self Care | Payer: Medicare Other | Attending: Urology | Admitting: Urology

## 2021-05-02 ENCOUNTER — Other Ambulatory Visit: Payer: Self-pay

## 2021-05-02 ENCOUNTER — Ambulatory Visit
Admission: RE | Admit: 2021-05-02 | Discharge: 2021-05-02 | Disposition: A | Discharge status: Home / Self Care | Payer: Medicare Other | Source: Ambulatory Visit | Attending: Urology | Admitting: Urology

## 2021-05-02 VITALS — BP 128/76 | HR 50 | Ht 69.0 in | Wt 164.0 lb

## 2021-05-02 DIAGNOSIS — N401 Enlarged prostate with lower urinary tract symptoms: Secondary | ICD-10-CM

## 2021-05-02 DIAGNOSIS — N138 Other obstructive and reflux uropathy: Secondary | ICD-10-CM | POA: Diagnosis not present

## 2021-05-02 DIAGNOSIS — N200 Calculus of kidney: Secondary | ICD-10-CM

## 2021-05-02 DIAGNOSIS — Z125 Encounter for screening for malignant neoplasm of prostate: Secondary | ICD-10-CM

## 2021-05-02 MED ORDER — TAMSULOSIN HCL 0.4 MG PO CAPS: 0.4000 mg | ORAL_CAPSULE | Freq: Every day | ORAL | 11 refills | Status: DC

## 2021-05-02 NOTE — Progress Notes (Signed)
   05/02/2021 11:59 AM   [name] 09-28-1950 [id number]    Reason for visit: Follow up nephrolithiasis, BPH, PSA screening   HPI: I saw Mr. Mckittrick back in urology clinic today for follow-up of the above issues.  He is a 70 year old healthy male with a history of spontaneously passed stones, hematuria with negative work-up, BPH well controlled on Flomax, and PSA screening with baseline PSA of 0.6.  He denies any problems over the last year.  I personally reviewed his KUB today that shows possible punctate nonobstructing renal stones bilaterally.  He denies any flank pain or hematuria.  His urinary symptoms are very well controlled on Flomax.    PSA is stable this year at 0.5 from 0.6 previously.  He denies any stone episodes or gross hematuria over the last year.  He had a recent fall that required surgical repair of his hip, and he is still recovering from that.  We discussed stone prevention strategies, including sufficient fluid intake, increasing citrate containing foods/beverages, low-salt diet, and avoiding high oxalate foods.  We also reviewed the AUA guidelines that do not recommend routine screening in men over age 43, and likely can discontinue PSA screening after next year if remains very low.    If doing well at our follow-up next year, can likely have Flomax filled by PCP and follow-up with urology as needed   [place], [place] [address], [place], [place] [address] [phone]

## 2021-05-02 NOTE — Patient Instructions (Signed)
Dietary Guidelines to Help Prevent Kidney Stones Kidney stones are deposits of minerals and salts that form inside your kidneys. Your risk of developing kidney stones may be greater depending on your diet, your lifestyle, the medicines you take, and whether you have certain medical conditions. Most people can lower their chances of developing kidney stones by following the instructions below. Your dietitian may give you more specific instructions depending on your overall health and the type of kidney stones you tend to develop. What are tips for following this plan? Reading food labels  Choose foods with "no salt added" or "low-salt" labels. Limit your salt (sodium) intake to less than 1,500 mg a day. Choose foods with calcium for each meal and snack. Try to eat about 300 mg of calcium at each meal. Foods that contain 200-500 mg of calcium a serving include: 8 oz (237 mL) of milk, calcium-fortifiednon-dairy milk, and calcium-fortifiedfruit juice. Calcium-fortified means that calcium has been added to these drinks. 8 oz (237 mL) of kefir, yogurt, and soy yogurt. 4 oz (114 g) of tofu. 1 oz (28 g) of cheese. 1 cup (150 g) of dried figs. 1 cup (91 g) of cooked broccoli. One 3 oz (85 g) can of sardines or mackerel. Most people need 1,000-1,500 mg of calcium a day. Talk to your dietitian about how much calcium is recommended for you. Shopping Buy plenty of fresh fruits and vegetables. Most people do not need to avoid fruits and vegetables, even if these foods contain nutrients that may contribute to kidney stones. When shopping for convenience foods, choose: Whole pieces of fruit. Pre-made salads with dressing on the side. Low-fat fruit and yogurt smoothies. Avoid buying frozen meals or prepared deli foods. These can be high in sodium. Look for foods with live cultures, such as yogurt and kefir. Choose high-fiber grains, such as whole-wheat breads, oat bran, and wheat cereals. Cooking Do not add  salt to food when cooking. Place a salt shaker on the table and allow each person to add his or her own salt to taste. Use vegetable protein, such as beans, textured vegetable protein (TVP), or tofu, instead of meat in pasta, casseroles, and soups. Meal planning Eat less salt, if told by your dietitian. To do this: Avoid eating processed or pre-made food. Avoid eating fast food. Eat less animal protein, including cheese, meat, poultry, or fish, if told by your dietitian. To do this: Limit the number of times you have meat, poultry, fish, or cheese each week. Eat a diet free of meat at least 2 days a week. Eat only one serving each day of meat, poultry, fish, or seafood. When you prepare animal protein, cut pieces into small portion sizes. For most meat and fish, one serving is about the size of the palm of your hand. Eat at least five servings of fresh fruits and vegetables each day. To do this: Keep fruits and vegetables on hand for snacks. Eat one piece of fruit or a handful of berries with breakfast. Have a salad and fruit at lunch. Have two kinds of vegetables at dinner. Limit foods that are high in a substance called oxalate. These include: Spinach (cooked), rhubarb, beets, sweet potatoes, and Swiss chard. Peanuts. Potato chips, french fries, and baked potatoes with skin on. Nuts and nut products. Chocolate. If you regularly take a diuretic medicine, make sure to eat at least 1 or 2 servings of fruits or vegetables that are high in potassium each day. These include: Avocado. Banana. Orange, prune,   carrot, or tomato juice. Baked potato. Cabbage. Beans and split peas. Lifestyle  Drink enough fluid to keep your urine pale yellow. This is the most important thing you can do. Spread your fluid intake throughout the day. If you drink alcohol: Limit how much you use to: 0-1 drink a day for women who are not pregnant. 0-2 drinks a day for men. Be aware of how much alcohol is in your  drink. In the [place] one drink equals one 12 oz bottle of beer (355 mL), one 5 oz glass of wine (148 mL), or one 1 oz glass of hard liquor (44 mL). Lose weight if told by your health care provider. Work with your dietitian to find an eating plan and weight loss strategies that work best for you. General information Talk to your health care provider and dietitian about taking daily supplements. You may be told the following depending on your health and the cause of your kidney stones: Not to take supplements with vitamin C. To take a calcium supplement. To take a daily probiotic supplement. To take other supplements such as magnesium, fish oil, or vitamin B6. Take over-the-counter and prescription medicines only as told by your health care provider. These include supplements. What foods should I limit? Limit your intake of the following foods, or eat them as told by your dietitian. Vegetables Spinach. Rhubarb. Beets. Canned vegetables. Angie Fava. Olives. Baked potatoes with skin. Grains Wheat bran. Baked goods. Salted crackers. Cereals high in sugar. Meats and other proteins Nuts. Nut butters. Large portions of meat, poultry, or fish. Salted, precooked, or cured meats, such as sausages, meat loaves, and hot dogs. Dairy Cheese. Beverages Regular soft drinks. Regular vegetable juice. Seasonings and condiments Seasoning blends with salt. Salad dressings. Soy sauce. Ketchup. Barbecue sauce. Other foods Canned soups. Canned pasta sauce. Casseroles. Pizza. Lasagna. Frozen meals. Potato chips. [place] fries. The items listed above may not be a complete list of foods and beverages you should limit. Contact a dietitian for more information. What foods should I avoid? Talk to your dietitian about specific foods you should avoid based on the type of kidney stones you have and your overall health. Fruits Grapefruit. The item listed above may not be a complete list of foods and beverages you should  avoid. Contact a dietitian for more information. Summary Kidney stones are deposits of minerals and salts that form inside your kidneys. You can lower your risk of kidney stones by making changes to your diet. The most important thing you can do is drink enough fluid. Drink enough fluid to keep your urine pale yellow. Talk to your dietitian about how much calcium you should have each day, and eat less salt and animal protein as told by your dietitian. This information is not intended to replace advice given to you by your health care provider. Make sure you discuss any questions you have with your health care provider. Document Revised: 08/11/2019 Document Reviewed: 08/11/2019 Elsevier Patient Education  2022 [employer].  Prostate Cancer Screening Prostate cancer screening is a test that is done to check for the presence of prostate cancer in men. The prostate gland is a walnut-sized gland that is located below the bladder and in front of the rectum in males. The function of the prostate is to add fluid to semen during ejaculation. Prostate cancer is the second most common type of cancer in men. Who should have prostate cancer screening? Screening recommendations vary based on age and other risk factors. Screening is recommended  if: You are older than age 80. If you are age 39-69, talk with your health care provider about your need for screening and how often screening should be done. Because most prostate cancers are slow growing and will not cause death, screening is generally reserved in this age group for men who have a 10-15-year life expectancy. You are younger than age 71, and you have these risk factors: Being a [place] male or a male of African descent. Having a father, brother, or uncle who has been diagnosed with prostate cancer. The risk is higher if your family member's cancer occurred at an early age. Screening is not recommended if: You are younger than age 16. You are between  the ages of 61 and 4 and you have no risk factors. You are 42 years of age or older. At this age, the risks that screening can cause are greater than the benefits that it may provide. If you are at high risk for prostate cancer, your health care provider may recommend that you have screenings more often or that you start screening at a younger age. How is screening for prostate cancer done? The recommended prostate cancer screening test is a blood test called the prostate-specific antigen (PSA) test. PSA is a protein that is made in the prostate. As you age, your prostate naturally produces more PSA. Abnormally high PSA levels may be caused by: Prostate cancer. An enlarged prostate that is not caused by cancer (benign prostatic hyperplasia, BPH). This condition is very common in older men. A prostate gland infection (prostatitis). Depending on the PSA results, you may need more tests, such as: A physical exam to check the size of your prostate gland. Blood and imaging tests. A procedure to remove tissue samples from your prostate gland for testing (biopsy). What are the benefits of prostate cancer screening? Screening can help to identify cancer at an early stage, before symptoms start and when the cancer can be treated more easily. There is a small chance that screening may lower your risk of dying from prostate cancer. The chance is small because prostate cancer is a slow-growing cancer, and most men with prostate cancer die from a different cause. What are the risks of prostate cancer screening? The main risk of prostate cancer screening is diagnosing and treating prostate cancer that would never have caused any symptoms or problems. This is called overdiagnosisand overtreatment. PSA screening cannot tell you if your PSA is high due to cancer or a different cause. A prostate biopsy is the only procedure to diagnose prostate cancer. Even the results of a biopsy may not tell you if your cancer  needs to be treated. Slow-growing prostate cancer may not need any treatment other than monitoring, so diagnosing and treating it may cause unnecessary stress or other side effects. Questions to ask your health care provider When should I start prostate cancer screening? What is my risk for prostate cancer? How often do I need screening? What type of screening tests do I need? How do I get my test results? What do my results mean? Do I need treatment? Where to find more information The American Cancer Society: www.cancer.org American Urological Association: www.auanet.org Contact a health care provider if: You have difficulty urinating. You have pain when you urinate or ejaculate. You have blood in your urine or semen. You have pain in your back or in the area of your prostate. Summary Prostate cancer is a common type of cancer in men. The prostate gland is  located below the bladder and in front of the rectum. This gland adds fluid to semen during ejaculation. Prostate cancer screening may identify cancer at an early stage, when the cancer can be treated more easily. The prostate-specific antigen (PSA) test is the recommended screening test for prostate cancer. Discuss the risks and benefits of prostate cancer screening with your health care provider. If you are age 17 or older, the risks that screening can cause are greater than the benefits that it may provide. This information is not intended to replace advice given to you by your health care provider. Make sure you discuss any questions you have with your health care provider. Document Revised: 10/19/2020 Document Reviewed: 03/31/2019 Elsevier Patient Education  [place].

## 2021-05-09 ENCOUNTER — Ambulatory Visit: Payer: Medicare Other | Attending: Anesthesiology | Admitting: Anesthesiology

## 2021-05-09 ENCOUNTER — Encounter: Payer: Self-pay | Admitting: Anesthesiology

## 2021-05-09 ENCOUNTER — Other Ambulatory Visit: Payer: Self-pay

## 2021-05-09 VITALS — BP 116/63 | HR 62 | Temp 97.0°F | Resp 16 | Ht 69.0 in | Wt 164.0 lb

## 2021-05-09 DIAGNOSIS — M79642 Pain in left hand: Secondary | ICD-10-CM | POA: Insufficient documentation

## 2021-05-09 DIAGNOSIS — M25561 Pain in right knee: Secondary | ICD-10-CM | POA: Diagnosis present

## 2021-05-09 DIAGNOSIS — F1190 Opioid use, unspecified, uncomplicated: Secondary | ICD-10-CM

## 2021-05-09 DIAGNOSIS — M25562 Pain in left knee: Secondary | ICD-10-CM | POA: Diagnosis present

## 2021-05-09 DIAGNOSIS — M5136 Other intervertebral disc degeneration, lumbar region: Secondary | ICD-10-CM | POA: Insufficient documentation

## 2021-05-09 DIAGNOSIS — M545 Low back pain, unspecified: Secondary | ICD-10-CM

## 2021-05-09 DIAGNOSIS — M47816 Spondylosis without myelopathy or radiculopathy, lumbar region: Secondary | ICD-10-CM | POA: Diagnosis not present

## 2021-05-09 DIAGNOSIS — G894 Chronic pain syndrome: Secondary | ICD-10-CM

## 2021-05-09 DIAGNOSIS — M170 Bilateral primary osteoarthritis of knee: Secondary | ICD-10-CM | POA: Diagnosis not present

## 2021-05-09 DIAGNOSIS — G8929 Other chronic pain: Secondary | ICD-10-CM

## 2021-05-09 DIAGNOSIS — M79643 Pain in unspecified hand: Secondary | ICD-10-CM | POA: Insufficient documentation

## 2021-05-09 DIAGNOSIS — M79641 Pain in right hand: Secondary | ICD-10-CM | POA: Diagnosis present

## 2021-05-09 DIAGNOSIS — M1990 Unspecified osteoarthritis, unspecified site: Secondary | ICD-10-CM | POA: Diagnosis present

## 2021-05-09 DIAGNOSIS — M51369 Other intervertebral disc degeneration, lumbar region without mention of lumbar back pain or lower extremity pain: Secondary | ICD-10-CM

## 2021-05-09 HISTORY — DX: Pain in right knee: M25.561

## 2021-05-09 HISTORY — DX: Opioid use, unspecified, uncomplicated: F11.90

## 2021-05-09 HISTORY — DX: Pain in right knee: M25.562

## 2021-05-09 HISTORY — DX: Low back pain, unspecified: M54.50

## 2021-05-09 HISTORY — DX: Other chronic pain: G89.29

## 2021-05-09 MED ORDER — FENTANYL 50 MCG/HR TD PT72: MEDICATED_PATCH | TRANSDERMAL | 0 refills | Status: DC

## 2021-05-09 MED ORDER — HYDROCODONE-ACETAMINOPHEN 10-325 MG PO TABS: 1.0000 | ORAL_TABLET | Freq: Three times a day (TID) | ORAL | 0 refills | Status: DC

## 2021-05-09 NOTE — Progress Notes (Deleted)
Safety precautions to be maintained throughout the outpatient stay will include: orient to surroundings, keep bed in low position, maintain call bell within reach at all times, provide assistance with transfer out of bed and ambulation.  
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
## 2021-05-09 NOTE — Progress Notes (Signed)
Nursing Pain Medication Assessment:  Safety precautions to be maintained throughout the outpatient stay will include: orient to surroundings, keep bed in low position, maintain call bell within reach at all times, provide assistance with transfer out of bed and ambulation.  Medication Inspection Compliance: Pill count conducted under aseptic conditions, in front of the patient. Neither the pills nor the bottle was removed from the patient's sight at any time. Once count was completed pills were immediately returned to the patient in their original bottle.  Medication: Fentanyl patch 50 mcg Pill/Patch Count:  3 of 5 pills remain Pill/Patch Appearance: Markings consistent with prescribed medication Bottle Appearance: Standard pharmacy container. Clearly labeled. Filled Date: 08 / 01 / 2022 Last Medication intake:  Today Hydrocodone 10/325 mg 13/90 Filled 04/01/2021 Last took today

## 2021-05-13 ENCOUNTER — Telehealth: Payer: Self-pay

## 2021-05-13 NOTE — Telephone Encounter (Signed)
Pt is requesting a nurse call him in regards to [facility] needing a form faxed over regarding pt records. Pt left Dr.Chris call back number [phone]

## 2021-05-13 NOTE — Telephone Encounter (Signed)
Returned patient phone call to see what he was needing.  He states the last time he was in Dr Andree Elk ask him to sign a release to send to Dr Primus Bravo office to get records as well as UDS results sent back to our office.  I told Mr Tanner Baker that he would have to sign a Release of Medical Records.  He states he will come up tomorrow sometime to take care of that.

## 2021-05-30 ENCOUNTER — Other Ambulatory Visit: Payer: Self-pay

## 2021-05-30 ENCOUNTER — Ambulatory Visit: Payer: Medicare Other | Attending: Anesthesiology | Admitting: Anesthesiology

## 2021-05-30 DIAGNOSIS — M5136 Other intervertebral disc degeneration, lumbar region: Secondary | ICD-10-CM

## 2021-05-30 DIAGNOSIS — M25561 Pain in right knee: Secondary | ICD-10-CM

## 2021-05-30 DIAGNOSIS — F1190 Opioid use, unspecified, uncomplicated: Secondary | ICD-10-CM

## 2021-05-30 DIAGNOSIS — M25562 Pain in left knee: Secondary | ICD-10-CM

## 2021-05-30 DIAGNOSIS — G8929 Other chronic pain: Secondary | ICD-10-CM

## 2021-05-30 DIAGNOSIS — M79642 Pain in left hand: Secondary | ICD-10-CM

## 2021-05-30 DIAGNOSIS — M170 Bilateral primary osteoarthritis of knee: Secondary | ICD-10-CM

## 2021-05-30 DIAGNOSIS — M47816 Spondylosis without myelopathy or radiculopathy, lumbar region: Secondary | ICD-10-CM | POA: Diagnosis not present

## 2021-05-30 DIAGNOSIS — M545 Low back pain, unspecified: Secondary | ICD-10-CM | POA: Diagnosis not present

## 2021-05-30 DIAGNOSIS — G894 Chronic pain syndrome: Secondary | ICD-10-CM

## 2021-05-30 DIAGNOSIS — M79641 Pain in right hand: Secondary | ICD-10-CM

## 2021-05-31 ENCOUNTER — Encounter: Payer: Self-pay | Admitting: Anesthesiology

## 2021-05-31 MED ORDER — HYDROCODONE-ACETAMINOPHEN 10-325 MG PO TABS: 1.0000 | ORAL_TABLET | Freq: Three times a day (TID) | ORAL | 0 refills | Status: DC

## 2021-05-31 MED ORDER — FENTANYL 50 MCG/HR TD PT72: MEDICATED_PATCH | TRANSDERMAL | 0 refills | Status: DC

## 2021-05-31 NOTE — Progress Notes (Signed)
Virtual Visit via Telephone Note  I connected with Tanner Baker on 05/31/21 at  2:10 PM EDT by telephone and verified that I am speaking with the correct person using two identifiers.  Location: Patient: Home Provider: Pain control center   I discussed the limitations, risks, security and privacy concerns of performing an evaluation and management service by telephone and the availability of in person appointments. I also discussed with the patient that there may be a patient responsible charge related to this service. The patient expressed understanding and agreed to proceed.   History of Present Illness: I spoke with Tanner Baker via telephone as we were unable to link for the video portion of the virtual conference.  He reports that he is doing well with his current pain management regimen.  He has been on this for an extended period of time and functions well with the Duragesic patch at 50 mcg changing every 3 days and taking hydrocodone for breakthrough pain on a daily basis.  He uses this for 3 times daily dosing and this combination has been able to keep his pain under good control.  The pain is primarily in the low back radiating down the legs he has some hand pain additionally but this is been stable.  No change in quality characteristic or distribution are noted.  He does report good relief with the medication generally approximately 50 to 75% lasting for 4 to 6 hours before he takes his next dose.  He is doing stretching strengthening exercises as tolerated.  Otherwise he is in his usual state of health.  Review of systems: General: No fevers or chills Pulmonary: No shortness of breath or dyspnea Cardiac: No angina or palpitations or lightheadedness GI: No abdominal pain or constipation Psych: No depression    Observations/Objective:  Current Outpatient Medications:    Ascorbic Acid (VITAMIN C) 100 MG tablet, Take 100 mg by mouth daily., Disp: , Rfl:    bisacodyl (DULCOLAX) 5 MG EC  tablet, Take 2 tablets (10 mg total) by mouth daily as needed for moderate constipation., Disp: 30 tablet, Rfl: 0   Cholecalciferol (VITAMIN D-3 PO), Take 1,000 Int'l Units/day by mouth every morning. Reported on 01/01/2016, Disp: , Rfl:    diclofenac Sodium (VOLTAREN) 1 % GEL, Apply topically., Disp: , Rfl:    famotidine (PEPCID) 20 MG tablet, Take 20 mg by mouth 2 (two) times daily., Disp: , Rfl:    [START ON 06/08/2021] fentaNYL (DURAGESIC) 50 MCG/HR, Applied 1 patch to skin every 3 days if tolerated, Disp: 10 patch, Rfl: 0   fluticasone (FLONASE) 50 MCG/ACT nasal spray, Place 1 spray into both nostrils daily as needed. Reported on 03/21/2016, Disp: , Rfl:    gabapentin (NEURONTIN) 100 MG capsule, Limit 2 - 3 tabs bid to tid if tolerated, Disp: 180 capsule, Rfl: 0   hydrochlorothiazide (HYDRODIURIL) 25 MG tablet, Take 25 mg by mouth daily., Disp: , Rfl:    [START ON 06/08/2021] HYDROcodone-acetaminophen (NORCO) 10-325 MG tablet, Take 1 tablet by mouth 3 (three) times daily., Disp: 90 tablet, Rfl: 0   latanoprost (XALATAN) 0.005 % ophthalmic solution, Place 2 drops into both eyes at bedtime. , Disp: , Rfl:    naloxone (NARCAN) nasal spray 4 mg/0.1 mL, SMARTSIG:Both Nares, Disp: , Rfl:    ondansetron (ZOFRAN) 4 MG tablet, Take 1 tablet by mouth every 6 (six) hours as needed., Disp: , Rfl:    PARoxetine (PAXIL) 30 MG tablet, Take 1 tablet by mouth daily., Disp: , Rfl:  propranolol (INDERAL) 20 MG tablet, Take 60 mg by mouth 2 (two) times daily. , Disp: , Rfl:    ranitidine (ZANTAC) 150 MG tablet, Take 150 mg by mouth 2 (two) times daily., Disp: , Rfl:    scopolamine (TRANSDERM-SCOP) 1 MG/3DAYS, 1 patch every 3 (three) days., Disp: , Rfl:    SUMAtriptan (IMITREX) 100 MG tablet, Take 100 mg by mouth once. May repeat in 2 hours if headache persists or recurs., Disp: , Rfl:    tamsulosin (FLOMAX) 0.4 MG CAPS capsule, Take 1 capsule (0.4 mg total) by mouth daily., Disp: 30 capsule, Rfl: 11   topiramate  (TOPAMAX) 50 MG tablet, Take 50 mg by mouth 2 (two) times daily., Disp: , Rfl:    traZODone (DESYREL) 50 MG tablet, Take 100 mg by mouth at bedtime as needed for sleep. Take 1-2 tablets at bedtime as needed, Disp: , Rfl:    Assessment and Plan: 1. Chronic bilateral low back pain without sciatica   2. DDD (degenerative disc disease), lumbar   3. Facet syndrome, lumbar   4. Primary osteoarthritis of both knees   5. Chronic pain of both knees   6. Chronic pain syndrome   7. Chronic, continuous use of opioids   8. Pain in both hands   Based on our discussion today and upon review of the [facility] practitioner database information it is appropriate to refill his medications.  No other changes will be made in his pain management protocol.  I do not see his most recent urine screen on file so this has been requested for verification otherwise we will plan on a urine drug screen within the next several weeks.  We will schedule him for a 1 month return to clinic.  I encouraged him to continue with his current stretching strengthening protocol with return as mentioned and continue follow-up with his primary care physicians for baseline medical care.  Follow Up Instructions:    I discussed the assessment and treatment plan with the patient. The patient was provided an opportunity to ask questions and all were answered. The patient agreed with the plan and demonstrated an understanding of the instructions.   The patient was advised to call back or seek an in-person evaluation if the symptoms worsen or if the condition fails to improve as anticipated.  I provided 30 minutes of non-face-to-face time during this encounter.   Molli Barrows, MD

## 2021-06-19 ENCOUNTER — Telehealth: Payer: Medicare Other | Admitting: Anesthesiology

## 2021-07-03 ENCOUNTER — Ambulatory Visit: Payer: Medicare Other | Attending: Anesthesiology | Admitting: Anesthesiology

## 2021-07-03 ENCOUNTER — Other Ambulatory Visit: Payer: Self-pay

## 2021-07-03 DIAGNOSIS — F1190 Opioid use, unspecified, uncomplicated: Secondary | ICD-10-CM

## 2021-07-03 DIAGNOSIS — M5136 Other intervertebral disc degeneration, lumbar region: Secondary | ICD-10-CM

## 2021-07-03 DIAGNOSIS — G894 Chronic pain syndrome: Secondary | ICD-10-CM

## 2021-07-03 DIAGNOSIS — M25562 Pain in left knee: Secondary | ICD-10-CM

## 2021-07-03 DIAGNOSIS — M47816 Spondylosis without myelopathy or radiculopathy, lumbar region: Secondary | ICD-10-CM

## 2021-07-03 DIAGNOSIS — G8929 Other chronic pain: Secondary | ICD-10-CM

## 2021-07-03 DIAGNOSIS — M170 Bilateral primary osteoarthritis of knee: Secondary | ICD-10-CM

## 2021-07-04 ENCOUNTER — Telehealth: Payer: Self-pay

## 2021-07-04 ENCOUNTER — Encounter: Payer: Self-pay | Admitting: Anesthesiology

## 2021-07-04 MED ORDER — FENTANYL 50 MCG/HR TD PT72: 1.0000 | MEDICATED_PATCH | TRANSDERMAL | 0 refills | Status: DC

## 2021-07-04 MED ORDER — HYDROCODONE-ACETAMINOPHEN 10-325 MG PO TABS: 1.0000 | ORAL_TABLET | Freq: Four times a day (QID) | ORAL | 0 refills | Status: DC | PRN

## 2021-07-04 NOTE — Telephone Encounter (Signed)
Please notify patient that Dr Andree Elk has put in an order for a UDS that needs to be done. Thank  you

## 2021-07-22 LAB — TOXASSURE SELECT 13 (MW), URINE

## 2021-08-05 ENCOUNTER — Other Ambulatory Visit: Payer: Self-pay

## 2021-08-05 ENCOUNTER — Ambulatory Visit: Payer: Medicare Other | Attending: Anesthesiology | Admitting: Anesthesiology

## 2021-08-05 ENCOUNTER — Encounter: Payer: Self-pay | Admitting: Anesthesiology

## 2021-08-05 DIAGNOSIS — M170 Bilateral primary osteoarthritis of knee: Secondary | ICD-10-CM | POA: Diagnosis not present

## 2021-08-05 DIAGNOSIS — M47816 Spondylosis without myelopathy or radiculopathy, lumbar region: Secondary | ICD-10-CM | POA: Diagnosis not present

## 2021-08-05 DIAGNOSIS — M79642 Pain in left hand: Secondary | ICD-10-CM

## 2021-08-05 DIAGNOSIS — M5136 Other intervertebral disc degeneration, lumbar region: Secondary | ICD-10-CM

## 2021-08-05 DIAGNOSIS — G894 Chronic pain syndrome: Secondary | ICD-10-CM

## 2021-08-05 DIAGNOSIS — M51369 Other intervertebral disc degeneration, lumbar region without mention of lumbar back pain or lower extremity pain: Secondary | ICD-10-CM

## 2021-08-05 DIAGNOSIS — F1190 Opioid use, unspecified, uncomplicated: Secondary | ICD-10-CM

## 2021-08-05 DIAGNOSIS — M545 Low back pain, unspecified: Secondary | ICD-10-CM

## 2021-08-05 DIAGNOSIS — M25562 Pain in left knee: Secondary | ICD-10-CM

## 2021-08-05 DIAGNOSIS — M79641 Pain in right hand: Secondary | ICD-10-CM

## 2021-08-05 DIAGNOSIS — M25561 Pain in right knee: Secondary | ICD-10-CM

## 2021-08-05 DIAGNOSIS — G8929 Other chronic pain: Secondary | ICD-10-CM

## 2021-08-05 MED ORDER — HYDROCODONE-ACETAMINOPHEN 10-325 MG PO TABS: 1.0000 | ORAL_TABLET | Freq: Four times a day (QID) | ORAL | 0 refills | Status: DC | PRN

## 2021-08-05 MED ORDER — FENTANYL 50 MCG/HR TD PT72: 1.0000 | MEDICATED_PATCH | TRANSDERMAL | 0 refills | Status: AC

## 2021-08-05 MED ORDER — FENTANYL 50 MCG/HR TD PT72: MEDICATED_PATCH | TRANSDERMAL | 0 refills | Status: DC

## 2021-08-05 MED ORDER — HYDROCODONE-ACETAMINOPHEN 10-325 MG PO TABS
1.0000 | ORAL_TABLET | Freq: Four times a day (QID) | ORAL | 0 refills | Status: AC | PRN
Start: 2021-08-09 — End: 2021-09-08

## 2021-08-07 NOTE — Progress Notes (Signed)
Virtual Visit via Telephone Note  I connected with Tanner Baker on 08/07/21 at  1:00 PM EST by telephone and verified that I am speaking with the correct person using two identifiers.  Location: Patient: Home Provider: Pain control center   I discussed the limitations, risks, security and privacy concerns of performing an evaluation and management service by telephone and the availability of in person appointments. I also discussed with the patient that there may be a patient responsible charge related to this service. The patient expressed understanding and agreed to proceed.   History of Present Illness: I spoke with Tanner Baker via telephone today we were unable link for the video portion of this conference and he states that he is doing well.  The pain that he is experiencing is generally reasonably well controlled with her existing medication regimen.  He takes his Duragesic patch as prescribed and uses opioids for breakthrough pain.  He denies any diverting or illicit use and based on his discussion and conversation today he reports good functional improvement with chronic opioid therapy.  He has failed more conservative therapy.  He has not responded to interventional therapy he reports.  The quality characteristic and distribution of the pain is stable with no change in lower extremity strength or function or bowel or bladder function.  Review of systems: General: No fevers or chills Pulmonary: No shortness of breath or dyspnea Cardiac: No angina or palpitations or lightheadedness GI: No abdominal pain or constipation Psych: No depression    Observations/Objective:   Current Outpatient Medications:    [START ON 09/07/2021] HYDROcodone-acetaminophen (NORCO) 10-325 MG tablet, Take 1 tablet by mouth every 6 (six) hours as needed for moderate pain or severe pain., Disp: 90 tablet, Rfl: 0   Ascorbic Acid (VITAMIN C) 100 MG tablet, Take 100 mg by mouth daily., Disp: , Rfl:    bisacodyl  (DULCOLAX) 5 MG EC tablet, Take 2 tablets (10 mg total) by mouth daily as needed for moderate constipation., Disp: 30 tablet, Rfl: 0   Cholecalciferol (VITAMIN D-3 PO), Take 1,000 Int'l Units/day by mouth every morning. Reported on 01/01/2016, Disp: , Rfl:    diclofenac Sodium (VOLTAREN) 1 % GEL, Apply topically., Disp: , Rfl:    famotidine (PEPCID) 20 MG tablet, Take 20 mg by mouth 2 (two) times daily., Disp: , Rfl:    [START ON 08/09/2021] fentaNYL (DURAGESIC) 50 MCG/HR, Applied 1 patch to skin every 3 days if tolerated, Disp: 10 patch, Rfl: 0   [START ON 09/07/2021] fentaNYL (DURAGESIC) 50 MCG/HR, Place 1 patch onto the skin every 3 (three) days., Disp: 10 patch, Rfl: 0   fluticasone (FLONASE) 50 MCG/ACT nasal spray, Place 1 spray into both nostrils daily as needed. Reported on 03/21/2016, Disp: , Rfl:    gabapentin (NEURONTIN) 100 MG capsule, Limit 2 - 3 tabs bid to tid if tolerated, Disp: 180 capsule, Rfl: 0   hydrochlorothiazide (HYDRODIURIL) 25 MG tablet, Take 25 mg by mouth daily., Disp: , Rfl:    [START ON 08/09/2021] HYDROcodone-acetaminophen (NORCO) 10-325 MG tablet, Take 1 tablet by mouth every 6 (six) hours as needed., Disp: 90 tablet, Rfl: 0   latanoprost (XALATAN) 0.005 % ophthalmic solution, Place 2 drops into both eyes at bedtime. , Disp: , Rfl:    naloxone (NARCAN) nasal spray 4 mg/0.1 mL, SMARTSIG:Both Nares, Disp: , Rfl:    ondansetron (ZOFRAN) 4 MG tablet, Take 1 tablet by mouth every 6 (six) hours as needed., Disp: , Rfl:    PARoxetine (PAXIL) 30  MG tablet, Take 1 tablet by mouth daily., Disp: , Rfl:    propranolol (INDERAL) 20 MG tablet, Take 60 mg by mouth 2 (two) times daily. , Disp: , Rfl:    ranitidine (ZANTAC) 150 MG tablet, Take 150 mg by mouth 2 (two) times daily., Disp: , Rfl:    scopolamine (TRANSDERM-SCOP) 1 MG/3DAYS, 1 patch every 3 (three) days., Disp: , Rfl:    SUMAtriptan (IMITREX) 100 MG tablet, Take 100 mg by mouth once. May repeat in 2 hours if headache persists or  recurs., Disp: , Rfl:    tamsulosin (FLOMAX) 0.4 MG CAPS capsule, Take 1 capsule (0.4 mg total) by mouth daily., Disp: 30 capsule, Rfl: 11   topiramate (TOPAMAX) 50 MG tablet, Take 50 mg by mouth 2 (two) times daily., Disp: , Rfl:    traZODone (DESYREL) 50 MG tablet, Take 100 mg by mouth at bedtime as needed for sleep. Take 1-2 tablets at bedtime as needed, Disp: , Rfl:   Assessment and Plan: 1. Chronic bilateral low back pain without sciatica   2. DDD (degenerative disc disease), lumbar   3. Facet syndrome, lumbar   4. Primary osteoarthritis of both knees   5. Chronic pain of both knees   6. Chronic pain syndrome   7. Chronic, continuous use of opioids   8. Pain in both hands   Based on our discussion today and after review of the [facility] practitioner database information it is appropriate to refill his medicines.  No changes will be made in his pain management protocol.  Medicines will be refilled for December 9 in January 8.  I have encouraged him to continue with the stretching strengthening exercises and activity.  He seems to be making good progress with these and staying active and having successful relief with the chronic opioid therapy.  Continue follow-up with his primary care physicians for baseline medical care with return to clinic in 2 months  Follow Up Instructions:    I discussed the assessment and treatment plan with the patient. The patient was provided an opportunity to ask questions and all were answered. The patient agreed with the plan and demonstrated an understanding of the instructions.   The patient was advised to call back or seek an in-person evaluation if the symptoms worsen or if the condition fails to improve as anticipated.  I provided 30 minutes of non-face-to-face time during this encounter.   Molli Barrows, MD

## 2021-08-29 ENCOUNTER — Encounter: Payer: Self-pay | Admitting: Urology

## 2021-09-06 ENCOUNTER — Other Ambulatory Visit: Payer: Self-pay

## 2021-09-06 ENCOUNTER — Ambulatory Visit: Payer: Medicare Other | Attending: Anesthesiology | Admitting: Anesthesiology

## 2021-09-06 DIAGNOSIS — M170 Bilateral primary osteoarthritis of knee: Secondary | ICD-10-CM | POA: Diagnosis not present

## 2021-09-06 DIAGNOSIS — M47816 Spondylosis without myelopathy or radiculopathy, lumbar region: Secondary | ICD-10-CM | POA: Diagnosis not present

## 2021-09-06 DIAGNOSIS — M25562 Pain in left knee: Secondary | ICD-10-CM

## 2021-09-06 DIAGNOSIS — G8929 Other chronic pain: Secondary | ICD-10-CM

## 2021-09-06 DIAGNOSIS — M5136 Other intervertebral disc degeneration, lumbar region: Secondary | ICD-10-CM

## 2021-09-06 DIAGNOSIS — G894 Chronic pain syndrome: Secondary | ICD-10-CM

## 2021-09-06 DIAGNOSIS — M79642 Pain in left hand: Secondary | ICD-10-CM

## 2021-09-06 DIAGNOSIS — M545 Low back pain, unspecified: Secondary | ICD-10-CM | POA: Diagnosis not present

## 2021-09-06 DIAGNOSIS — F1190 Opioid use, unspecified, uncomplicated: Secondary | ICD-10-CM

## 2021-09-06 DIAGNOSIS — M25561 Pain in right knee: Secondary | ICD-10-CM

## 2021-09-06 DIAGNOSIS — M79641 Pain in right hand: Secondary | ICD-10-CM

## 2021-09-06 MED ORDER — HYDROCODONE-ACETAMINOPHEN 10-325 MG PO TABS: 1.0000 | ORAL_TABLET | Freq: Four times a day (QID) | ORAL | 0 refills | Status: DC | PRN

## 2021-09-06 MED ORDER — FENTANYL 50 MCG/HR TD PT72: 1.0000 | MEDICATED_PATCH | TRANSDERMAL | 0 refills | Status: AC

## 2021-09-06 MED ORDER — FENTANYL 50 MCG/HR TD PT72: 1.0000 | MEDICATED_PATCH | TRANSDERMAL | 0 refills | Status: DC

## 2021-09-06 NOTE — Progress Notes (Signed)
Virtual Visit via Telephone Note  I connected with Tanner Baker on 09/06/21 at 11:00 AM EST by telephone and verified that I am speaking with the correct person using two identifiers.  Location: Patient: Home Provider: Pain control center   I discussed the limitations, risks, security and privacy concerns of performing an evaluation and management service by telephone and the availability of in person appointments. I also discussed with the patient that there may be a patient responsible charge related to this service. The patient expressed understanding and agreed to proceed.   History of Present Illness: I spoke with Tanner Baker today via telephone as he was unable to link for the video portion of this virtual conference.  He reports that his low back pain and leg pain as well as knee pain and hand pain have been stable in nature.  No recent changes in the severity are noted.  The quality characteristic and distribution are also stable in nature.  He still taking his Duragesic patch at the 50 mcg strength every 3 days as a baseline for pain control and this continues to work well for him.  He is hydrocodone 10 mg strength 3 times a day and this is for breakthrough pain and is used consistently.  No side effects are reported.  He continues to derive good functional benefit from his medications.  He is try to stay active but reports some limitation secondary to the cold weather.  Otherwise he is in his usual state of health. Review of systems: General: No fevers or chills Pulmonary: No shortness of breath or dyspnea Cardiac: No angina or palpitations or lightheadedness GI: No abdominal pain or constipation Psych: No depression     Observations/Objective:  Current Outpatient Medications:    [START ON 11/02/2021] fentaNYL (DURAGESIC) 50 MCG/HR, Place 1 patch onto the skin every 3 (three) days., Disp: 10 patch, Rfl: 0   [START ON 11/02/2021] HYDROcodone-acetaminophen (NORCO) 10-325 MG tablet, Take 1  tablet by mouth every 6 (six) hours as needed for moderate pain or severe pain., Disp: 90 tablet, Rfl: 0   Ascorbic Acid (VITAMIN C) 100 MG tablet, Take 100 mg by mouth daily., Disp: , Rfl:    bisacodyl (DULCOLAX) 5 MG EC tablet, Take 2 tablets (10 mg total) by mouth daily as needed for moderate constipation., Disp: 30 tablet, Rfl: 0   Cholecalciferol (VITAMIN D-3 PO), Take 1,000 Int'l Units/day by mouth every morning. Reported on 01/01/2016, Disp: , Rfl:    diclofenac Sodium (VOLTAREN) 1 % GEL, Apply topically., Disp: , Rfl:    famotidine (PEPCID) 20 MG tablet, Take 20 mg by mouth 2 (two) times daily., Disp: , Rfl:    [START ON 09/07/2021] fentaNYL (DURAGESIC) 50 MCG/HR, Place 1 patch onto the skin every 3 (three) days., Disp: 10 patch, Rfl: 0   [START ON 10/07/2021] fentaNYL (DURAGESIC) 50 MCG/HR, Place 1 patch onto the skin every 3 (three) days. Applied 1 patch to skin every 3 days if tolerated, Disp: 10 patch, Rfl: 0   fluticasone (FLONASE) 50 MCG/ACT nasal spray, Place 1 spray into both nostrils daily as needed. Reported on 03/21/2016, Disp: , Rfl:    gabapentin (NEURONTIN) 100 MG capsule, Limit 2 - 3 tabs bid to tid if tolerated, Disp: 180 capsule, Rfl: 0   hydrochlorothiazide (HYDRODIURIL) 25 MG tablet, Take 25 mg by mouth daily., Disp: , Rfl:    HYDROcodone-acetaminophen (NORCO) 10-325 MG tablet, Take 1 tablet by mouth every 6 (six) hours as needed., Disp: 90 tablet, Rfl: 0   [  START ON 10/07/2021] HYDROcodone-acetaminophen (NORCO) 10-325 MG tablet, Take 1 tablet by mouth every 6 (six) hours as needed for moderate pain or severe pain., Disp: 90 tablet, Rfl: 0   latanoprost (XALATAN) 0.005 % ophthalmic solution, Place 2 drops into both eyes at bedtime. , Disp: , Rfl:    naloxone (NARCAN) nasal spray 4 mg/0.1 mL, SMARTSIG:Both Nares, Disp: , Rfl:    ondansetron (ZOFRAN) 4 MG tablet, Take 1 tablet by mouth every 6 (six) hours as needed., Disp: , Rfl:    PARoxetine (PAXIL) 30 MG tablet, Take 1 tablet by  mouth daily., Disp: , Rfl:    propranolol (INDERAL) 20 MG tablet, Take 60 mg by mouth 2 (two) times daily. , Disp: , Rfl:    ranitidine (ZANTAC) 150 MG tablet, Take 150 mg by mouth 2 (two) times daily., Disp: , Rfl:    scopolamine (TRANSDERM-SCOP) 1 MG/3DAYS, 1 patch every 3 (three) days., Disp: , Rfl:    SUMAtriptan (IMITREX) 100 MG tablet, Take 100 mg by mouth once. May repeat in 2 hours if headache persists or recurs., Disp: , Rfl:    tamsulosin (FLOMAX) 0.4 MG CAPS capsule, Take 1 capsule (0.4 mg total) by mouth daily., Disp: 30 capsule, Rfl: 11   topiramate (TOPAMAX) 50 MG tablet, Take 50 mg by mouth 2 (two) times daily., Disp: , Rfl:    traZODone (DESYREL) 50 MG tablet, Take 100 mg by mouth at bedtime as needed for sleep. Take 1-2 tablets at bedtime as needed, Disp: , Rfl:   Past Medical History:  Diagnosis Date   Anxiety    Cancer ([place])    skin cancer   Chronic low back pain 05/09/2021   Chronic pain syndrome    Chronic, continuous use of opioids 05/09/2021   DDD (degenerative disc disease), cervical    Depression    Depression    Glaucoma    Heartburn    Hypertension    Kidney stone    Kidney stones    Knee pain, bilateral 05/09/2021   Migraine    Migraine    Post-traumatic headache    Sleep apnea    Traumatic arthritis     Assessment and Plan: 1. Chronic bilateral low back pain without sciatica   2. DDD (degenerative disc disease), lumbar   3. Facet syndrome, lumbar   4. Primary osteoarthritis of both knees   5. Chronic pain of both knees   6. Chronic pain syndrome   7. Chronic, continuous use of opioids   8. Pain in both hands   Based on our discussion today and after review of the [facility] practitioner database information it is appropriate to refill his medicines for the next 2 months.  No changes will be made in his pharmacologic regimen.  I encouraged him to continue to exercise stretch and strengthen his core as tolerated.  We will keep him on his same  medications at present and refills will be given for February 6 and March 4.  We will schedule a return to clinic in approximately 2 months.  His last urine screen was appropriate.  Continue follow-up with his primary care physician for baseline medical care for  Follow Up Instructions:    I discussed the assessment and treatment plan with the patient. The patient was provided an opportunity to ask questions and all were answered. The patient agreed with the plan and demonstrated an understanding of the instructions.   The patient was advised to call back or seek an in-person evaluation  if the symptoms worsen or if the condition fails to improve as anticipated.  I provided 30 minutes of non-face-to-face time during this encounter.   Molli Barrows, MD

## 2021-11-05 ENCOUNTER — Encounter: Payer: Self-pay | Admitting: Anesthesiology

## 2021-11-05 ENCOUNTER — Ambulatory Visit: Payer: Medicare Other | Attending: Anesthesiology | Admitting: Anesthesiology

## 2021-11-05 ENCOUNTER — Other Ambulatory Visit: Payer: Self-pay

## 2021-11-05 DIAGNOSIS — M170 Bilateral primary osteoarthritis of knee: Secondary | ICD-10-CM

## 2021-11-05 DIAGNOSIS — M79641 Pain in right hand: Secondary | ICD-10-CM

## 2021-11-05 DIAGNOSIS — M79642 Pain in left hand: Secondary | ICD-10-CM

## 2021-11-05 DIAGNOSIS — F1190 Opioid use, unspecified, uncomplicated: Secondary | ICD-10-CM

## 2021-11-05 DIAGNOSIS — M47816 Spondylosis without myelopathy or radiculopathy, lumbar region: Secondary | ICD-10-CM | POA: Diagnosis not present

## 2021-11-05 DIAGNOSIS — M5136 Other intervertebral disc degeneration, lumbar region: Secondary | ICD-10-CM

## 2021-11-05 DIAGNOSIS — M25562 Pain in left knee: Secondary | ICD-10-CM

## 2021-11-05 DIAGNOSIS — M51369 Other intervertebral disc degeneration, lumbar region without mention of lumbar back pain or lower extremity pain: Secondary | ICD-10-CM

## 2021-11-05 DIAGNOSIS — M545 Low back pain, unspecified: Secondary | ICD-10-CM | POA: Diagnosis not present

## 2021-11-05 DIAGNOSIS — G894 Chronic pain syndrome: Secondary | ICD-10-CM

## 2021-11-05 DIAGNOSIS — G8929 Other chronic pain: Secondary | ICD-10-CM

## 2021-11-05 DIAGNOSIS — M25561 Pain in right knee: Secondary | ICD-10-CM

## 2021-11-05 MED ORDER — HYDROCODONE-ACETAMINOPHEN 10-325 MG PO TABS: 1.0000 | ORAL_TABLET | Freq: Four times a day (QID) | ORAL | 0 refills | Status: DC | PRN

## 2021-11-05 MED ORDER — FENTANYL 50 MCG/HR TD PT72: 1.0000 | MEDICATED_PATCH | TRANSDERMAL | 0 refills | Status: AC

## 2021-11-05 MED ORDER — GABAPENTIN 100 MG PO CAPS: 100.0000 mg | ORAL_CAPSULE | Freq: Three times a day (TID) | ORAL | 3 refills | Status: DC

## 2021-11-05 MED ORDER — HYDROCODONE-ACETAMINOPHEN 10-325 MG PO TABS: 1.0000 | ORAL_TABLET | Freq: Four times a day (QID) | ORAL | 0 refills | Status: AC | PRN

## 2021-11-05 MED ORDER — FENTANYL 50 MCG/HR TD PT72: 1.0000 | MEDICATED_PATCH | TRANSDERMAL | 0 refills | Status: DC

## 2021-11-05 NOTE — Progress Notes (Signed)
Virtual Visit via Telephone Note ? ?I connected with Tanner Baker on 11/05/21 at  4:35 PM EST by telephone and verified that I am speaking with the correct person using two identifiers. ? ?Location: ?Patient: Home ?Provider: Pain control center ?  ?I discussed the limitations, risks, security and privacy concerns of performing an evaluation and management service by telephone and the availability of in person appointments. I also discussed with the patient that there may be a patient responsible charge related to this service. The patient expressed understanding and agreed to proceed. ? ? ?History of Present Illness: ?I spoke with Tanner Baker via telephone as he was unable to do the video portion of the conference.  He reports that he has been doing reasonably well with his low back hip and knee pain.  The quality characteristic and distribution has been stable.  He has been on chronic opioids for this recalcitrant pain and these have worked well for him.  No side effects are reported and his pain control is much better than more conservative therapy.  He is sleeping well and staying active.  He takes a Duragesic patch 50 mcg every 3 days with hydrocodone 3 times a day.  This keeps his pain under manageable control and he staying active.  No changes are noted his strength has been decent he is able to do his stretching strengthening activities as prescribed.  Otherwise he is in his usual state of health.  He does take Dulcolax about every 3 days to help with some intermittent mild constipation. ? ?Review of systems: ?General: No fevers or chills ?Pulmonary: No shortness of breath or dyspnea ?Cardiac: No angina or palpitations or lightheadedness ?GI: No abdominal pain or constipation ?Psych: No depression  ?  ?Observations/Objective: ? ?Current Outpatient Medications:  ?  [START ON 12/11/2021] fentaNYL (DURAGESIC) 50 MCG/HR, Place 1 patch onto the skin every 3 (three) days., Disp: 10 patch, Rfl: 0 ?  Ascorbic  Acid (VITAMIN C) 100 MG tablet, Take 100 mg by mouth daily., Disp: , Rfl:  ?  bisacodyl (DULCOLAX) 5 MG EC tablet, Take 2 tablets (10 mg total) by mouth daily as needed for moderate constipation., Disp: 30 tablet, Rfl: 0 ?  Cholecalciferol (VITAMIN D-3 PO), Take 1,000 Int'l Units/day by mouth every morning. Reported on 01/01/2016, Disp: , Rfl:  ?  diclofenac Sodium (VOLTAREN) 1 % GEL, Apply topically., Disp: , Rfl:  ?  famotidine (PEPCID) 20 MG tablet, Take 20 mg by mouth 2 (two) times daily., Disp: , Rfl:  ?  fentaNYL (DURAGESIC) 50 MCG/HR, Place 1 patch onto the skin every 3 (three) days., Disp: 10 patch, Rfl: 0 ?  [START ON 11/11/2021] fentaNYL (DURAGESIC) 50 MCG/HR, Place 1 patch onto the skin every 3 (three) days. Applied 1 patch to skin every 3 days if tolerated, Disp: 10 patch, Rfl: 0 ?  fluticasone (FLONASE) 50 MCG/ACT nasal spray, Place 1 spray into both nostrils daily as needed. Reported on 03/21/2016, Disp: , Rfl:  ?  gabapentin (NEURONTIN) 100 MG capsule, Take 1 capsule (100 mg total) by mouth 3 (three) times daily., Disp: 90 capsule, Rfl: 3 ?  hydrochlorothiazide (HYDRODIURIL) 25 MG tablet, Take 25 mg by mouth daily., Disp: , Rfl:  ?  [START ON 11/11/2021] HYDROcodone-acetaminophen (NORCO) 10-325 MG tablet, Take 1 tablet by mouth every 6 (six) hours as needed for moderate pain or severe pain., Disp: 90 tablet, Rfl: 0 ?  [START ON 12/11/2021] HYDROcodone-acetaminophen (NORCO) 10-325 MG tablet, Take 1 tablet by mouth every  6 (six) hours as needed for moderate pain or severe pain., Disp: 90 tablet, Rfl: 0 ?  latanoprost (XALATAN) 0.005 % ophthalmic solution, Place 2 drops into both eyes at bedtime. , Disp: , Rfl:  ?  naloxone (NARCAN) nasal spray 4 mg/0.1 mL, SMARTSIG:Both Nares, Disp: , Rfl:  ?  ondansetron (ZOFRAN) 4 MG tablet, Take 1 tablet by mouth every 6 (six) hours as needed., Disp: , Rfl:  ?  PARoxetine (PAXIL) 30 MG tablet, Take 1 tablet by mouth daily., Disp: , Rfl:  ?  propranolol (INDERAL) 20 MG  tablet, Take 60 mg by mouth 2 (two) times daily. , Disp: , Rfl:  ?  ranitidine (ZANTAC) 150 MG tablet, Take 150 mg by mouth 2 (two) times daily., Disp: , Rfl:  ?  scopolamine (TRANSDERM-SCOP) 1 MG/3DAYS, 1 patch every 3 (three) days., Disp: , Rfl:  ?  SUMAtriptan (IMITREX) 100 MG tablet, Take 100 mg by mouth once. May repeat in 2 hours if headache persists or recurs., Disp: , Rfl:  ?  tamsulosin (FLOMAX) 0.4 MG CAPS capsule, Take 1 capsule (0.4 mg total) by mouth daily., Disp: 30 capsule, Rfl: 11 ?  topiramate (TOPAMAX) 50 MG tablet, Take 50 mg by mouth 2 (two) times daily., Disp: , Rfl:  ?  traZODone (DESYREL) 50 MG tablet, Take 100 mg by mouth at bedtime as needed for sleep. Take 1-2 tablets at bedtime as needed, Disp: , Rfl:   ? ?Past Medical History:  ?Diagnosis Date  ? Anxiety   ? Cancer [facility])   ? skin cancer  ? Chronic low back pain 05/09/2021  ? Chronic pain syndrome   ? Chronic, continuous use of opioids 05/09/2021  ? DDD (degenerative disc disease), cervical   ? Depression   ? Depression   ? Glaucoma   ? Heartburn   ? Hypertension   ? Kidney stone   ? Kidney stones   ? Knee pain, bilateral 05/09/2021  ? Migraine   ? Migraine   ? Post-traumatic headache   ? Sleep apnea   ? Traumatic arthritis   ?  ? ?Assessment and Plan: ? ?1. Chronic bilateral low back pain without sciatica   ?2. DDD (degenerative disc disease), lumbar   ?3. Facet syndrome, lumbar   ?4. Primary osteoarthritis of both knees   ?5. Chronic pain of both knees   ?6. Chronic pain syndrome   ?7. Chronic, continuous use of opioids   ?8. Pain in both hands   ?Based on our discussion today and after review of the [facility] practitioner database information I think it is appropriate to refill his medicines for the next 2 months.  This be dated from March 13 and April 12.  No other changes in his pharmacologic regimen are initiated.  I encouraged him to continue with stretching strengthening exercises and stay active especially with the warmer weather.   He is to continue follow-up with his primary care physicians.  We will schedule him for 31-monthreturn to clinic. ?Follow Up Instructions: ? ?  ?I discussed the assessment and treatment plan with the patient. The patient was provided an opportunity to ask questions and all were answered. The patient agreed with the plan and demonstrated an understanding of the instructions. ?  ?The patient was advised to call back or seek an in-person evaluation if the symptoms worsen or if the condition fails to improve as anticipated. ? ?I provided 30 minutes of non-face-to-face time during this encounter. ? ? ?JMolli Barrows MD  ?

## 2021-12-12 ENCOUNTER — Telehealth: Payer: Self-pay

## 2021-12-12 ENCOUNTER — Telehealth: Payer: Self-pay | Admitting: Anesthesiology

## 2021-12-12 NOTE — Telephone Encounter (Signed)
Patient's pharmacy called stating he needs prior auth for his scripts. They will fax over request.  ?

## 2021-12-12 NOTE — Telephone Encounter (Signed)
[facility] will be looking for it.  ?

## 2021-12-12 NOTE — Telephone Encounter (Signed)
PA for Hydrocodone and Fentanyl done via [facility]  key 3 [id number] and [id number] ?

## 2021-12-30 ENCOUNTER — Ambulatory Visit: Payer: Medicare HMO | Attending: Anesthesiology | Admitting: Anesthesiology

## 2021-12-30 ENCOUNTER — Encounter: Payer: Self-pay | Admitting: Anesthesiology

## 2021-12-30 DIAGNOSIS — M170 Bilateral primary osteoarthritis of knee: Secondary | ICD-10-CM | POA: Diagnosis not present

## 2021-12-30 DIAGNOSIS — M25561 Pain in right knee: Secondary | ICD-10-CM | POA: Diagnosis not present

## 2021-12-30 DIAGNOSIS — M545 Low back pain, unspecified: Secondary | ICD-10-CM

## 2021-12-30 DIAGNOSIS — M5136 Other intervertebral disc degeneration, lumbar region: Secondary | ICD-10-CM

## 2021-12-30 DIAGNOSIS — G8929 Other chronic pain: Secondary | ICD-10-CM | POA: Diagnosis not present

## 2021-12-30 DIAGNOSIS — M47816 Spondylosis without myelopathy or radiculopathy, lumbar region: Secondary | ICD-10-CM

## 2021-12-30 DIAGNOSIS — M25562 Pain in left knee: Secondary | ICD-10-CM | POA: Diagnosis not present

## 2021-12-30 DIAGNOSIS — G894 Chronic pain syndrome: Secondary | ICD-10-CM | POA: Diagnosis not present

## 2021-12-30 MED ORDER — HYDROCODONE-ACETAMINOPHEN 10-325 MG PO TABS: 1.0000 | ORAL_TABLET | Freq: Four times a day (QID) | ORAL | 0 refills | Status: DC | PRN

## 2021-12-30 MED ORDER — FENTANYL 50 MCG/HR TD PT72: 1.0000 | MEDICATED_PATCH | TRANSDERMAL | 0 refills | Status: AC

## 2021-12-30 MED ORDER — HYDROCODONE-ACETAMINOPHEN 10-325 MG PO TABS
1.0000 | ORAL_TABLET | Freq: Four times a day (QID) | ORAL | 0 refills | Status: AC | PRN
Start: 2022-01-10 — End: 2022-02-09

## 2021-12-30 MED ORDER — GABAPENTIN 100 MG PO CAPS: 100.0000 mg | ORAL_CAPSULE | Freq: Three times a day (TID) | ORAL | 3 refills | Status: DC

## 2021-12-30 MED ORDER — FENTANYL 50 MCG/HR TD PT72: 1.0000 | MEDICATED_PATCH | TRANSDERMAL | 0 refills | Status: DC

## 2021-12-30 NOTE — Progress Notes (Signed)
Virtual Visit via Telephone Note ? ?I connected with Tanner Baker on 12/30/21 at  1:15 PM EDT by telephone and verified that I am speaking with the correct person using two identifiers. ? ?Location: ?Patient: Home ?Provider: [facility] ?  ?I discussed the limitations, risks, security and privacy concerns of performing an evaluation and management service by telephone and the availability of in person appointments. I also discussed with the patient that there may be a patient responsible charge related to this service. The patient expressed understanding and agreed to proceed. ? ? ?History of Present Illness: ?I spoke with Tanner Baker today via telephone as we were unable link for the video portion the conference.  He reports that he is doing well.  The quality characteristic and distribution of his low back pain hip pain and lower extremity pain have been stable in nature with no recent changes.  He staying active and with the warmer weather and has been able to be outside and increase his walking which he feels is helping some with the back pain.  He still taking his medications as prescribed and using these appropriately.  No side effects are reported.  No change in lower extremity strength function or bowel or bladder function is noted. ? ?Review of systems: ?General: No fevers or chills ?Pulmonary: No shortness of breath or dyspnea ?Cardiac: No angina or palpitations or lightheadedness ?GI: No abdominal pain or constipation ?Psych: No depression  ?  ?Observations/Objective: ? ?Current Outpatient Medications:  ?  [START ON 02/08/2022] fentaNYL (DURAGESIC) 50 MCG/HR, Place 1 patch onto the skin every 3 (three) days., Disp: 10 patch, Rfl: 0 ?  [START ON 02/08/2022] HYDROcodone-acetaminophen (NORCO) 10-325 MG tablet, Take 1 tablet by mouth every 6 (six) hours as needed for moderate pain or severe pain., Disp: 90 tablet, Rfl: 0 ?  Ascorbic Acid (VITAMIN C) 100 MG tablet, Take 100 mg by mouth daily., Disp:  , Rfl:  ?  bisacodyl (DULCOLAX) 5 MG EC tablet, Take 2 tablets (10 mg total) by mouth daily as needed for moderate constipation., Disp: 30 tablet, Rfl: 0 ?  Cholecalciferol (VITAMIN D-3 PO), Take 1,000 Int'l Units/day by mouth every morning. Reported on 01/01/2016, Disp: , Rfl:  ?  diclofenac Sodium (VOLTAREN) 1 % GEL, Apply topically., Disp: , Rfl:  ?  famotidine (PEPCID) 20 MG tablet, Take 20 mg by mouth 2 (two) times daily., Disp: , Rfl:  ?  [START ON 01/10/2022] fentaNYL (DURAGESIC) 50 MCG/HR, Place 1 patch onto the skin every 3 (three) days., Disp: 10 patch, Rfl: 0 ?  fluticasone (FLONASE) 50 MCG/ACT nasal spray, Place 1 spray into both nostrils daily as needed. Reported on 03/21/2016, Disp: , Rfl:  ?  gabapentin (NEURONTIN) 100 MG capsule, Take 1 capsule (100 mg total) by mouth 3 (three) times daily., Disp: 90 capsule, Rfl: 3 ?  hydrochlorothiazide (HYDRODIURIL) 25 MG tablet, Take 25 mg by mouth daily., Disp: , Rfl:  ?  [START ON 01/10/2022] HYDROcodone-acetaminophen (NORCO) 10-325 MG tablet, Take 1 tablet by mouth every 6 (six) hours as needed for moderate pain or severe pain., Disp: 90 tablet, Rfl: 0 ?  latanoprost (XALATAN) 0.005 % ophthalmic solution, Place 2 drops into both eyes at bedtime. , Disp: , Rfl:  ?  naloxone (NARCAN) nasal spray 4 mg/0.1 mL, SMARTSIG:Both Nares, Disp: , Rfl:  ?  ondansetron (ZOFRAN) 4 MG tablet, Take 1 tablet by mouth every 6 (six) hours as needed., Disp: , Rfl:  ?  PARoxetine (PAXIL) 30 MG  tablet, Take 1 tablet by mouth daily., Disp: , Rfl:  ?  propranolol (INDERAL) 20 MG tablet, Take 60 mg by mouth 2 (two) times daily. , Disp: , Rfl:  ?  ranitidine (ZANTAC) 150 MG tablet, Take 150 mg by mouth 2 (two) times daily., Disp: , Rfl:  ?  scopolamine (TRANSDERM-SCOP) 1 MG/3DAYS, 1 patch every 3 (three) days., Disp: , Rfl:  ?  SUMAtriptan (IMITREX) 100 MG tablet, Take 100 mg by mouth once. May repeat in 2 hours if headache persists or recurs., Disp: , Rfl:  ?  tamsulosin (FLOMAX) 0.4 MG  CAPS capsule, Take 1 capsule (0.4 mg total) by mouth daily., Disp: 30 capsule, Rfl: 11 ?  topiramate (TOPAMAX) 50 MG tablet, Take 50 mg by mouth 2 (two) times daily., Disp: , Rfl:  ?  traZODone (DESYREL) 50 MG tablet, Take 100 mg by mouth at bedtime as needed for sleep. Take 1-2 tablets at bedtime as needed, Disp: , Rfl:   ? ?Past Medical History:  ?Diagnosis Date  ? Anxiety   ? Cancer [facility])   ? skin cancer  ? Chronic low back pain 05/09/2021  ? Chronic pain syndrome   ? Chronic, continuous use of opioids 05/09/2021  ? DDD (degenerative disc disease), cervical   ? Depression   ? Depression   ? Glaucoma   ? Heartburn   ? Hypertension   ? Kidney stone   ? Kidney stones   ? Knee pain, bilateral 05/09/2021  ? Migraine   ? Migraine   ? Post-traumatic headache   ? Sleep apnea   ? Traumatic arthritis   ?  ? ?Assessment and Plan: ?1. Chronic bilateral low back pain without sciatica   ?2. DDD (degenerative disc disease), lumbar   ?3. Facet syndrome, lumbar   ?4. Primary osteoarthritis of both knees   ?5. Chronic pain of both knees   ?6. Chronic pain syndrome   ?Based on our discussion today and his response to therapy I think it is appropriate to continue with her current regimen.  Refills will be given for May 12 and June 11 with return to clinic in 2 months.  I have reviewed the [facility] practitioner database information and it is appropriate.  No other changes will be initiated today and have encouraged him to continue follow-up with his primary care physicians for baseline medical care. ? ?Follow Up Instructions: ? ?  ?I discussed the assessment and treatment plan with the patient. The patient was provided an opportunity to ask questions and all were answered. The patient agreed with the plan and demonstrated an understanding of the instructions. ?  ?The patient was advised to call back or seek an in-person evaluation if the symptoms worsen or if the condition fails to improve as anticipated. ? ?I provided 30 minutes of  non-face-to-face time during this encounter. ? ? ?Molli Barrows, MD  ?

## 2022-01-08 DIAGNOSIS — C44519 Basal cell carcinoma of skin of other part of trunk: Secondary | ICD-10-CM | POA: Diagnosis not present

## 2022-01-08 DIAGNOSIS — L570 Actinic keratosis: Secondary | ICD-10-CM | POA: Diagnosis not present

## 2022-01-08 DIAGNOSIS — L578 Other skin changes due to chronic exposure to nonionizing radiation: Secondary | ICD-10-CM | POA: Diagnosis not present

## 2022-01-08 DIAGNOSIS — Z85820 Personal history of malignant melanoma of skin: Secondary | ICD-10-CM | POA: Diagnosis not present

## 2022-01-08 DIAGNOSIS — D485 Neoplasm of uncertain behavior of skin: Secondary | ICD-10-CM | POA: Diagnosis not present

## 2022-01-08 DIAGNOSIS — I781 Nevus, non-neoplastic: Secondary | ICD-10-CM | POA: Diagnosis not present

## 2022-01-08 DIAGNOSIS — C44619 Basal cell carcinoma of skin of left upper limb, including shoulder: Secondary | ICD-10-CM | POA: Diagnosis not present

## 2022-01-08 DIAGNOSIS — Z859 Personal history of malignant neoplasm, unspecified: Secondary | ICD-10-CM | POA: Diagnosis not present

## 2022-01-08 DIAGNOSIS — Z85828 Personal history of other malignant neoplasm of skin: Secondary | ICD-10-CM | POA: Diagnosis not present

## 2022-01-08 DIAGNOSIS — D2239 Melanocytic nevi of other parts of face: Secondary | ICD-10-CM | POA: Diagnosis not present

## 2022-01-21 DIAGNOSIS — F325 Major depressive disorder, single episode, in full remission: Secondary | ICD-10-CM | POA: Diagnosis not present

## 2022-01-21 DIAGNOSIS — G894 Chronic pain syndrome: Secondary | ICD-10-CM | POA: Diagnosis not present

## 2022-01-21 DIAGNOSIS — I10 Essential (primary) hypertension: Secondary | ICD-10-CM | POA: Diagnosis not present

## 2022-01-21 DIAGNOSIS — G43809 Other migraine, not intractable, without status migrainosus: Secondary | ICD-10-CM | POA: Diagnosis not present

## 2022-02-06 DIAGNOSIS — I10 Essential (primary) hypertension: Secondary | ICD-10-CM | POA: Diagnosis not present

## 2022-02-06 DIAGNOSIS — Z125 Encounter for screening for malignant neoplasm of prostate: Secondary | ICD-10-CM | POA: Diagnosis not present

## 2022-02-06 DIAGNOSIS — G43809 Other migraine, not intractable, without status migrainosus: Secondary | ICD-10-CM | POA: Diagnosis not present

## 2022-02-12 DIAGNOSIS — F439 Reaction to severe stress, unspecified: Secondary | ICD-10-CM | POA: Diagnosis not present

## 2022-02-13 DIAGNOSIS — Z1389 Encounter for screening for other disorder: Secondary | ICD-10-CM | POA: Diagnosis not present

## 2022-02-13 DIAGNOSIS — G43909 Migraine, unspecified, not intractable, without status migrainosus: Secondary | ICD-10-CM | POA: Diagnosis not present

## 2022-02-13 DIAGNOSIS — I10 Essential (primary) hypertension: Secondary | ICD-10-CM | POA: Diagnosis not present

## 2022-02-13 DIAGNOSIS — G894 Chronic pain syndrome: Secondary | ICD-10-CM | POA: Diagnosis not present

## 2022-02-13 DIAGNOSIS — Z125 Encounter for screening for malignant neoplasm of prostate: Secondary | ICD-10-CM | POA: Diagnosis not present

## 2022-02-13 DIAGNOSIS — F325 Major depressive disorder, single episode, in full remission: Secondary | ICD-10-CM | POA: Diagnosis not present

## 2022-02-13 DIAGNOSIS — Z0000 Encounter for general adult medical examination without abnormal findings: Secondary | ICD-10-CM | POA: Diagnosis not present

## 2022-02-17 DIAGNOSIS — D225 Melanocytic nevi of trunk: Secondary | ICD-10-CM | POA: Diagnosis not present

## 2022-02-17 DIAGNOSIS — C4491 Basal cell carcinoma of skin, unspecified: Secondary | ICD-10-CM | POA: Diagnosis not present

## 2022-02-21 DIAGNOSIS — F439 Reaction to severe stress, unspecified: Secondary | ICD-10-CM | POA: Diagnosis not present

## 2022-02-24 ENCOUNTER — Ambulatory Visit: Payer: Medicare HMO | Attending: Anesthesiology | Admitting: Anesthesiology

## 2022-02-24 ENCOUNTER — Encounter: Payer: Self-pay | Admitting: Anesthesiology

## 2022-02-24 DIAGNOSIS — G894 Chronic pain syndrome: Secondary | ICD-10-CM

## 2022-02-24 DIAGNOSIS — M25561 Pain in right knee: Secondary | ICD-10-CM | POA: Diagnosis not present

## 2022-02-24 DIAGNOSIS — M170 Bilateral primary osteoarthritis of knee: Secondary | ICD-10-CM

## 2022-02-24 DIAGNOSIS — M79642 Pain in left hand: Secondary | ICD-10-CM

## 2022-02-24 DIAGNOSIS — F1190 Opioid use, unspecified, uncomplicated: Secondary | ICD-10-CM | POA: Diagnosis not present

## 2022-02-24 DIAGNOSIS — G8929 Other chronic pain: Secondary | ICD-10-CM

## 2022-02-24 DIAGNOSIS — M25562 Pain in left knee: Secondary | ICD-10-CM

## 2022-02-24 DIAGNOSIS — M47816 Spondylosis without myelopathy or radiculopathy, lumbar region: Secondary | ICD-10-CM | POA: Diagnosis not present

## 2022-02-24 DIAGNOSIS — M79641 Pain in right hand: Secondary | ICD-10-CM

## 2022-02-24 DIAGNOSIS — M545 Low back pain, unspecified: Secondary | ICD-10-CM

## 2022-02-24 DIAGNOSIS — M51369 Other intervertebral disc degeneration, lumbar region without mention of lumbar back pain or lower extremity pain: Secondary | ICD-10-CM

## 2022-02-24 DIAGNOSIS — M5136 Other intervertebral disc degeneration, lumbar region: Secondary | ICD-10-CM

## 2022-02-24 MED ORDER — FENTANYL 50 MCG/HR TD PT72: 1.0000 | MEDICATED_PATCH | TRANSDERMAL | 0 refills | Status: AC

## 2022-02-24 MED ORDER — FENTANYL 50 MCG/HR TD PT72: 1.0000 | MEDICATED_PATCH | TRANSDERMAL | 0 refills | Status: DC

## 2022-02-24 MED ORDER — HYDROCODONE-ACETAMINOPHEN 10-325 MG PO TABS
1.0000 | ORAL_TABLET | Freq: Four times a day (QID) | ORAL | 0 refills | Status: DC | PRN
Start: 2022-04-09 — End: 2022-04-21

## 2022-02-24 MED ORDER — HYDROCODONE-ACETAMINOPHEN 10-325 MG PO TABS: 1.0000 | ORAL_TABLET | Freq: Four times a day (QID) | ORAL | 0 refills | Status: AC | PRN

## 2022-02-25 DIAGNOSIS — F439 Reaction to severe stress, unspecified: Secondary | ICD-10-CM | POA: Diagnosis not present

## 2022-03-05 DIAGNOSIS — C4491 Basal cell carcinoma of skin, unspecified: Secondary | ICD-10-CM | POA: Diagnosis not present

## 2022-03-05 DIAGNOSIS — C44619 Basal cell carcinoma of skin of left upper limb, including shoulder: Secondary | ICD-10-CM | POA: Diagnosis not present

## 2022-03-31 ENCOUNTER — Telehealth: Payer: Self-pay | Admitting: Anesthesiology

## 2022-03-31 NOTE — Telephone Encounter (Signed)
Patient stated that he wants his meds to be send to [facility] in [place], [place]. Please give patient a call. Thanks

## 2022-04-21 ENCOUNTER — Ambulatory Visit: Payer: Medicare HMO | Attending: Anesthesiology | Admitting: Anesthesiology

## 2022-04-21 ENCOUNTER — Encounter: Payer: Self-pay | Admitting: Anesthesiology

## 2022-04-21 DIAGNOSIS — M545 Low back pain, unspecified: Secondary | ICD-10-CM

## 2022-04-21 DIAGNOSIS — M25562 Pain in left knee: Secondary | ICD-10-CM

## 2022-04-21 DIAGNOSIS — M170 Bilateral primary osteoarthritis of knee: Secondary | ICD-10-CM | POA: Diagnosis not present

## 2022-04-21 DIAGNOSIS — M79641 Pain in right hand: Secondary | ICD-10-CM

## 2022-04-21 DIAGNOSIS — M1990 Unspecified osteoarthritis, unspecified site: Secondary | ICD-10-CM

## 2022-04-21 DIAGNOSIS — M47816 Spondylosis without myelopathy or radiculopathy, lumbar region: Secondary | ICD-10-CM | POA: Diagnosis not present

## 2022-04-21 DIAGNOSIS — M5136 Other intervertebral disc degeneration, lumbar region: Secondary | ICD-10-CM

## 2022-04-21 DIAGNOSIS — G894 Chronic pain syndrome: Secondary | ICD-10-CM

## 2022-04-21 DIAGNOSIS — F1190 Opioid use, unspecified, uncomplicated: Secondary | ICD-10-CM

## 2022-04-21 DIAGNOSIS — M51369 Other intervertebral disc degeneration, lumbar region without mention of lumbar back pain or lower extremity pain: Secondary | ICD-10-CM

## 2022-04-21 DIAGNOSIS — M79642 Pain in left hand: Secondary | ICD-10-CM

## 2022-04-21 DIAGNOSIS — M25561 Pain in right knee: Secondary | ICD-10-CM | POA: Diagnosis not present

## 2022-04-21 DIAGNOSIS — G8929 Other chronic pain: Secondary | ICD-10-CM

## 2022-04-21 MED ORDER — GABAPENTIN 100 MG PO CAPS: 100.0000 mg | ORAL_CAPSULE | Freq: Three times a day (TID) | ORAL | 0 refills | Status: DC

## 2022-04-21 MED ORDER — FENTANYL 50 MCG/HR TD PT72: 1.0000 | MEDICATED_PATCH | TRANSDERMAL | 0 refills | Status: AC

## 2022-04-21 MED ORDER — HYDROCODONE-ACETAMINOPHEN 10-325 MG PO TABS: 1.0000 | ORAL_TABLET | Freq: Four times a day (QID) | ORAL | 0 refills | Status: AC | PRN

## 2022-04-21 MED ORDER — HYDROCODONE-ACETAMINOPHEN 10-325 MG PO TABS: 1.0000 | ORAL_TABLET | Freq: Four times a day (QID) | ORAL | 0 refills | Status: DC | PRN

## 2022-04-21 MED ORDER — FENTANYL 50 MCG/HR TD PT72: 1.0000 | MEDICATED_PATCH | TRANSDERMAL | 0 refills | Status: DC

## 2022-04-23 NOTE — Progress Notes (Signed)
Virtual Visit via Telephone Note  I connected with Tanner Baker on 04/23/22 at  3:00 PM EDT by telephone and verified that I am speaking with the correct person using two identifiers.  Location: Patient: Home Provider: Pain control center   I discussed the limitations, risks, security and privacy concerns of performing an evaluation and management service by telephone and the availability of in person appointments. I also discussed with the patient that there may be a patient responsible charge related to this service. The patient expressed understanding and agreed to proceed.   History of Present Illness: I spoke with Tanner Baker via telephone as we are unable link for the video portion of the conference.  He reports that his low back pain and lower extremity pain is stable.  No recent changes are reported.  He still using his Duragesic patch and 3 times a day dosing for his opioids and this combination is working very well for him.  Keep his pain under control and enables him to stay functional and active and sleep better at night.  No side effects reported.  The quality characteristic and distribution of this pain is stable in nature.  Review of systems: General: No fevers or chills Pulmonary: No shortness of breath or dyspnea Cardiac: No angina or palpitations or lightheadedness GI: No abdominal pain or constipation Psych: No depression    Observations/Objective:  Current Outpatient Medications:    [START ON 06/10/2022] fentaNYL (DURAGESIC) 50 MCG/HR, Place 1 patch onto the skin every 3 (three) days., Disp: 10 patch, Rfl: 0   [START ON 06/10/2022] HYDROcodone-acetaminophen (NORCO) 10-325 MG tablet, Take 1 tablet by mouth every 6 (six) hours as needed for moderate pain or severe pain., Disp: 90 tablet, Rfl: 0   Ascorbic Acid (VITAMIN C) 100 MG tablet, Take 100 mg by mouth daily., Disp: , Rfl:    bisacodyl (DULCOLAX) 5 MG EC tablet, Take 2 tablets (10 mg total) by mouth daily as needed for  moderate constipation., Disp: 30 tablet, Rfl: 0   Cholecalciferol (VITAMIN D-3 PO), Take 1,000 Int'l Units/day by mouth every morning. Reported on 01/01/2016, Disp: , Rfl:    diclofenac Sodium (VOLTAREN) 1 % GEL, Apply topically., Disp: , Rfl:    famotidine (PEPCID) 20 MG tablet, Take 20 mg by mouth 2 (two) times daily., Disp: , Rfl:    [START ON 05/11/2022] fentaNYL (DURAGESIC) 50 MCG/HR, Place 1 patch onto the skin every 3 (three) days., Disp: 10 patch, Rfl: 0   fluticasone (FLONASE) 50 MCG/ACT nasal spray, Place 1 spray into both nostrils daily as needed. Reported on 03/21/2016, Disp: , Rfl:    gabapentin (NEURONTIN) 100 MG capsule, Take 1 capsule (100 mg total) by mouth 3 (three) times daily., Disp: 360 capsule, Rfl: 0   hydrochlorothiazide (HYDRODIURIL) 25 MG tablet, Take 25 mg by mouth daily., Disp: , Rfl:    [START ON 05/11/2022] HYDROcodone-acetaminophen (NORCO) 10-325 MG tablet, Take 1 tablet by mouth every 6 (six) hours as needed for moderate pain or severe pain., Disp: 90 tablet, Rfl: 0   latanoprost (XALATAN) 0.005 % ophthalmic solution, Place 2 drops into both eyes at bedtime. , Disp: , Rfl:    naloxone (NARCAN) nasal spray 4 mg/0.1 mL, SMARTSIG:Both Nares, Disp: , Rfl:    ondansetron (ZOFRAN) 4 MG tablet, Take 1 tablet by mouth every 6 (six) hours as needed., Disp: , Rfl:    PARoxetine (PAXIL) 30 MG tablet, Take 1 tablet by mouth daily., Disp: , Rfl:    propranolol (INDERAL) 20  MG tablet, Take 60 mg by mouth 2 (two) times daily. , Disp: , Rfl:    ranitidine (ZANTAC) 150 MG tablet, Take 150 mg by mouth 2 (two) times daily., Disp: , Rfl:    scopolamine (TRANSDERM-SCOP) 1 MG/3DAYS, 1 patch every 3 (three) days., Disp: , Rfl:    SUMAtriptan (IMITREX) 100 MG tablet, Take 100 mg by mouth once. May repeat in 2 hours if headache persists or recurs., Disp: , Rfl:    tamsulosin (FLOMAX) 0.4 MG CAPS capsule, Take 1 capsule (0.4 mg total) by mouth daily., Disp: 30 capsule, Rfl: 11   topiramate  (TOPAMAX) 50 MG tablet, Take 50 mg by mouth 2 (two) times daily., Disp: , Rfl:    traZODone (DESYREL) 50 MG tablet, Take 100 mg by mouth at bedtime as needed for sleep. Take 1-2 tablets at bedtime as needed, Disp: , Rfl:    Past Medical History:  Diagnosis Date   Anxiety    Cancer ([place])    skin cancer   Chronic low back pain 05/09/2021   Chronic pain syndrome    Chronic, continuous use of opioids 05/09/2021   DDD (degenerative disc disease), cervical    Depression    Depression    Glaucoma    Heartburn    Hypertension    Kidney stone    Kidney stones    Knee pain, bilateral 05/09/2021   Migraine    Migraine    Post-traumatic headache    Sleep apnea    Traumatic arthritis      Assessment and Plan: 1. Chronic bilateral low back pain without sciatica   2. DDD (degenerative disc disease), lumbar   3. Facet syndrome, lumbar   4. Primary osteoarthritis of both knees   5. Chronic pain of both knees   6. Chronic pain syndrome   7. Chronic, continuous use of opioids   8. Pain in both hands   9. Arthritis   Based on our discussion today I think it is appropriate to refill his medicines for the next 2 months.  These will be dated for September 10 and October 10.  No other changes in his regimen will be initiated.  I encouraged him to continue his stretching strengthening exercises and ambulation.  We will schedule for follow-up in 2 months and continue follow-up with his primary care physicians for baseline medical care.  Follow Up Instructions:    I discussed the assessment and treatment plan with the patient. The patient was provided an opportunity to ask questions and all were answered. The patient agreed with the plan and demonstrated an understanding of the instructions.   The patient was advised to call back or seek an in-person evaluation if the symptoms worsen or if the condition fails to improve as anticipated.  I provided 30 minutes of non-face-to-face time during this  encounter.   Molli Barrows, MD

## 2022-04-28 ENCOUNTER — Other Ambulatory Visit: Payer: Medicare Other

## 2022-04-29 ENCOUNTER — Other Ambulatory Visit: Payer: Medicare HMO

## 2022-04-29 DIAGNOSIS — N138 Other obstructive and reflux uropathy: Secondary | ICD-10-CM

## 2022-04-29 DIAGNOSIS — N401 Enlarged prostate with lower urinary tract symptoms: Secondary | ICD-10-CM | POA: Diagnosis not present

## 2022-04-30 LAB — PSA: Prostate Specific Ag, Serum: 0.6 ng/mL (ref 0.0–4.0)

## 2022-05-07 ENCOUNTER — Other Ambulatory Visit: Payer: Self-pay

## 2022-05-07 DIAGNOSIS — N200 Calculus of kidney: Secondary | ICD-10-CM

## 2022-05-08 ENCOUNTER — Ambulatory Visit
Admission: RE | Admit: 2022-05-08 | Discharge: 2022-05-08 | Disposition: A | Discharge status: Home / Self Care | Payer: Medicare HMO | Source: Ambulatory Visit | Attending: Urology | Admitting: Urology

## 2022-05-08 ENCOUNTER — Ambulatory Visit
Admission: RE | Admit: 2022-05-08 | Discharge: 2022-05-08 | Disposition: A | Discharge status: Home / Self Care | Payer: Medicare HMO | Attending: Urology | Admitting: Urology

## 2022-05-08 ENCOUNTER — Ambulatory Visit: Payer: Medicare Other | Admitting: Urology

## 2022-05-08 ENCOUNTER — Encounter: Payer: Self-pay | Admitting: Urology

## 2022-05-08 ENCOUNTER — Ambulatory Visit: Payer: Medicare HMO | Admitting: Urology

## 2022-05-08 VITALS — BP 154/80 | HR 52 | Ht 69.0 in | Wt 160.0 lb

## 2022-05-08 DIAGNOSIS — Z87442 Personal history of urinary calculi: Secondary | ICD-10-CM | POA: Diagnosis not present

## 2022-05-08 DIAGNOSIS — Z125 Encounter for screening for malignant neoplasm of prostate: Secondary | ICD-10-CM

## 2022-05-08 DIAGNOSIS — N200 Calculus of kidney: Secondary | ICD-10-CM

## 2022-05-08 DIAGNOSIS — N401 Enlarged prostate with lower urinary tract symptoms: Secondary | ICD-10-CM | POA: Diagnosis not present

## 2022-05-08 DIAGNOSIS — I878 Other specified disorders of veins: Secondary | ICD-10-CM | POA: Diagnosis not present

## 2022-05-08 DIAGNOSIS — N138 Other obstructive and reflux uropathy: Secondary | ICD-10-CM | POA: Diagnosis not present

## 2022-05-08 MED ORDER — TAMSULOSIN HCL 0.4 MG PO CAPS: 0.4000 mg | ORAL_CAPSULE | Freq: Every day | ORAL | 11 refills | Status: AC

## 2022-05-08 NOTE — Patient Instructions (Signed)

## 2022-05-08 NOTE — Progress Notes (Signed)
   05/08/2022 3:49 PM   [name] May 24, 1951 [id number]    Reason for visit: Follow up nephrolithiasis, BPH, PSA screening   HPI: I saw Mr. Gongaware back in urology clinic today for follow-up of the above issues.  He is a 71 year old healthy male with a history of spontaneously passed stones, hematuria with negative work-up, BPH well controlled on Flomax, and PSA screening with baseline PSA of 0.6.  He denies any problems over the last year.  I personally reviewed his KUB today that shows no significant stone disease.  He denies any flank pain or hematuria.  His urinary symptoms are very well controlled on Flomax.    PSA is stable this year at 0.6 from 0.5 last year, and 0.6 over the last few years.  Likely can discontinue screening moving forward per the AUA guideline recommendations.  We discussed stone prevention strategies, including sufficient fluid intake, increasing citrate containing foods/beverages, low-salt diet, and avoiding high oxalate foods.  We also reviewed the AUA guidelines that do not recommend routine screening in men over age 75, and likely can discontinue PSA screening.  -Flomax refilled -Can follow-up with PCP moving forward to refill Flomax, happy to see him again in the future for any kidney stone issues or worsening urinary symptoms   Billey Co, MD  [facility] [address] [place], [place] [address] [phone]

## 2022-06-02 DIAGNOSIS — H40013 Open angle with borderline findings, low risk, bilateral: Secondary | ICD-10-CM | POA: Diagnosis not present

## 2022-06-02 DIAGNOSIS — I10 Essential (primary) hypertension: Secondary | ICD-10-CM | POA: Diagnosis not present

## 2022-06-02 DIAGNOSIS — H2513 Age-related nuclear cataract, bilateral: Secondary | ICD-10-CM | POA: Diagnosis not present

## 2022-06-18 ENCOUNTER — Ambulatory Visit: Payer: Medicare HMO | Attending: Anesthesiology | Admitting: Anesthesiology

## 2022-06-18 ENCOUNTER — Encounter: Payer: Self-pay | Admitting: Anesthesiology

## 2022-06-18 VITALS — BP 139/72 | HR 51 | Temp 97.9°F | Resp 16 | Ht 69.0 in | Wt 160.0 lb

## 2022-06-18 DIAGNOSIS — M47816 Spondylosis without myelopathy or radiculopathy, lumbar region: Secondary | ICD-10-CM | POA: Diagnosis not present

## 2022-06-18 DIAGNOSIS — M51369 Other intervertebral disc degeneration, lumbar region without mention of lumbar back pain or lower extremity pain: Secondary | ICD-10-CM

## 2022-06-18 DIAGNOSIS — M25561 Pain in right knee: Secondary | ICD-10-CM | POA: Diagnosis not present

## 2022-06-18 DIAGNOSIS — M170 Bilateral primary osteoarthritis of knee: Secondary | ICD-10-CM | POA: Diagnosis not present

## 2022-06-18 DIAGNOSIS — M545 Low back pain, unspecified: Secondary | ICD-10-CM | POA: Insufficient documentation

## 2022-06-18 DIAGNOSIS — G894 Chronic pain syndrome: Secondary | ICD-10-CM | POA: Diagnosis not present

## 2022-06-18 DIAGNOSIS — M25562 Pain in left knee: Secondary | ICD-10-CM | POA: Diagnosis not present

## 2022-06-18 DIAGNOSIS — F1190 Opioid use, unspecified, uncomplicated: Secondary | ICD-10-CM

## 2022-06-18 DIAGNOSIS — G8929 Other chronic pain: Secondary | ICD-10-CM

## 2022-06-18 DIAGNOSIS — M79642 Pain in left hand: Secondary | ICD-10-CM

## 2022-06-18 DIAGNOSIS — M79641 Pain in right hand: Secondary | ICD-10-CM | POA: Insufficient documentation

## 2022-06-18 DIAGNOSIS — M5136 Other intervertebral disc degeneration, lumbar region: Secondary | ICD-10-CM | POA: Diagnosis not present

## 2022-06-18 MED ORDER — HYDROCODONE-ACETAMINOPHEN 10-325 MG PO TABS: 1.0000 | ORAL_TABLET | Freq: Three times a day (TID) | ORAL | 0 refills | Status: DC | PRN

## 2022-06-18 MED ORDER — FENTANYL 50 MCG/HR TD PT72: 1.0000 | MEDICATED_PATCH | TRANSDERMAL | 0 refills | Status: AC

## 2022-06-18 MED ORDER — LINACLOTIDE 72 MCG PO CAPS: 72.0000 ug | ORAL_CAPSULE | Freq: Every day | ORAL | 1 refills | Status: AC

## 2022-06-18 MED ORDER — HYDROCODONE-ACETAMINOPHEN 10-325 MG PO TABS: 1.0000 | ORAL_TABLET | Freq: Four times a day (QID) | ORAL | 0 refills | Status: DC | PRN

## 2022-06-18 NOTE — Patient Instructions (Signed)
Orthopedist   Dr. Roland Rack

## 2022-06-18 NOTE — Progress Notes (Signed)
Subjective:  Patient ID: Tanner Baker, male    DOB: 04-Dec-1950  Age: 71 y.o. MRN: [id number]  CC: Knee Pain (left)   Procedure: None  HPI Tanner Baker presents for reevaluation.  Tanner Baker was last seen a few months ago and continues to have a lot of pain in the low back thoracic and lumbar region.  He is taking his Duragesic patch at 50 mcg every 3 days and his short acting opioids to help with keep his pain under control.  This regimen has worked well with his underlying condition and keeps him active.  He describes good improved lifestyle and function better sleep.  He is complaining that he is having some intermittent constipation which has not responded to over-the-counter therapy.  No change in the quality characteristic and distribution of his low back pain is noted.  He has been stable.  Otherwise doing well.  Outpatient Medications Prior to Visit  Medication Sig Dispense Refill   bisacodyl (DULCOLAX) 5 MG EC tablet Take 2 tablets (10 mg total) by mouth daily as needed for moderate constipation. 30 tablet 0   diclofenac Sodium (VOLTAREN) 1 % GEL Apply topically.     famotidine (PEPCID) 20 MG tablet Take 20 mg by mouth 2 (two) times daily.     fluticasone (FLONASE) 50 MCG/ACT nasal spray Place 1 spray into both nostrils daily as needed. Reported on 03/21/2016     hydrochlorothiazide (HYDRODIURIL) 25 MG tablet Take 25 mg by mouth daily.     latanoprost (XALATAN) 0.005 % ophthalmic solution Place 2 drops into both eyes at bedtime.      naloxone (NARCAN) nasal spray 4 mg/0.1 mL SMARTSIG:Both Nares     ondansetron (ZOFRAN) 4 MG tablet Take 1 tablet by mouth every 6 (six) hours as needed.     PARoxetine (PAXIL) 30 MG tablet Take 1 tablet by mouth daily.     propranolol (INDERAL) 20 MG tablet Take 60 mg by mouth 2 (two) times daily.      ranitidine (ZANTAC) 150 MG tablet Take 150 mg by mouth 2 (two) times daily.     scopolamine (TRANSDERM-SCOP) 1 MG/3DAYS 1 patch every 3 (three) days.      SUMAtriptan (IMITREX) 100 MG tablet Take 100 mg by mouth once. May repeat in 2 hours if headache persists or recurs.     tamsulosin (FLOMAX) 0.4 MG CAPS capsule Take 1 capsule (0.4 mg total) by mouth daily. 30 capsule 11   topiramate (TOPAMAX) 50 MG tablet Take 50 mg by mouth 2 (two) times daily.     traZODone (DESYREL) 50 MG tablet Take 100 mg by mouth at bedtime as needed for sleep. Take 1-2 tablets at bedtime as needed     fentaNYL (DURAGESIC) 50 MCG/HR Place 1 patch onto the skin every 3 (three) days. 10 patch 0   HYDROcodone-acetaminophen (NORCO) 10-325 MG tablet Take 1 tablet by mouth every 6 (six) hours as needed for moderate pain or severe pain. 90 tablet 0   Ascorbic Acid (VITAMIN C) 100 MG tablet Take 100 mg by mouth daily. (Patient not taking: Reported on 06/18/2022)     Cholecalciferol (VITAMIN D-3 PO) Take 1,000 Int'l Units/day by mouth every morning. Reported on 01/01/2016 (Patient not taking: Reported on 06/18/2022)     gabapentin (NEURONTIN) 100 MG capsule Take 1 capsule (100 mg total) by mouth 3 (three) times daily. 360 capsule 0   No facility-administered medications prior to visit.    Review of Systems CNS: No confusion or sedation  Cardiac: No angina or palpitations GI: No abdominal pain or constipation Constitutional: No nausea vomiting fevers or chills  Objective:  BP 139/72   Pulse (!) 51   Temp 97.9 F (36.6 C) (Temporal)   Resp 16   Ht '5\' 9"'$  (1.753 m)   Wt 160 lb (72.6 kg)   SpO2 99%   BMI 23.63 kg/m    BP Readings from Last 3 Encounters:  06/18/22 139/72  05/08/22 (!) 154/80  05/09/21 116/63     Wt Readings from Last 3 Encounters:  06/18/22 160 lb (72.6 kg)  05/08/22 160 lb (72.6 kg)  05/09/21 164 lb (74.4 kg)     Physical Exam Pt is alert and oriented PERRL EOMI HEART IS RRR no murmur or rub LCTA no wheezing or rales MUSCULOSKELETAL reveals some paraspinous muscle tenderness but no lumbar trigger points.  He is ambulating with an antalgic  gait of muscle strength appears to be at baseline.  Labs  No results found for: "HGBA1C" Lab Results  Component Value Date   CREATININE 1.18 11/29/2020    -------------------------------------------------------------------------------------------------------------------- Lab Results  Component Value Date   WBC 12.4 (H) 11/29/2020   HGB 7.9 (L) 11/29/2020   HCT 23.7 (L) 11/29/2020   PLT 117 (L) 11/29/2020   GLUCOSE 103 (H) 11/29/2020   ALT 15 11/26/2020   AST 21 11/26/2020   NA 140 11/29/2020   K 3.8 11/29/2020   CL 112 (H) 11/29/2020   CREATININE 1.18 11/29/2020   BUN 22 11/29/2020   CO2 24 11/29/2020   INR 1.1 11/26/2020    --------------------------------------------------------------------------------------------------------------------- Abdomen 1 view (KUB)  Result Date: 05/10/2022 CLINICAL DATA:  Follow-up kidney stone from 1 year ago. Asymptomatic. EXAM: ABDOMEN - 1 VIEW COMPARISON:  05/02/2021 FINDINGS: Bowel gas pattern is nonobstructive with mild fecal retention throughout the colon. No definite stones visualized over the kidneys. Small phlebolith over the right pelvis. Degenerative change of the spine. Fixation hardware over the right hip unchanged. IMPRESSION: Nonobstructive bowel gas pattern.  No definite renal stones. Electronically Signed   By: Marin Olp M.D.   On: 05/10/2022 13:12     Assessment & Plan:   Tanner Baker was seen today for knee pain.  Diagnoses and all orders for this visit:  Chronic bilateral low back pain without sciatica  DDD (degenerative disc disease), lumbar  Facet syndrome, lumbar  Primary osteoarthritis of both knees  Chronic pain of both knees  Chronic pain syndrome -     ToxASSURE Select 13 (MW), Urine  Chronic, continuous use of opioids -     ToxASSURE Select 13 (MW), Urine  Pain in both hands  Other orders -     HYDROcodone-acetaminophen (NORCO) 10-325 MG tablet; Take 1 tablet by mouth every 6 (six) hours as needed for  moderate pain or severe pain. -     fentaNYL (DURAGESIC) 50 MCG/HR; Place 1 patch onto the skin every 3 (three) days. -     HYDROcodone-acetaminophen (NORCO) 10-325 MG tablet; Take 1 tablet by mouth every 8 (eight) hours as needed for moderate pain or severe pain. -     fentaNYL (DURAGESIC) 50 MCG/HR; Place 1 patch onto the skin every 3 (three) days. -     linaclotide (LINZESS) 72 MCG capsule; Take 1 capsule (72 mcg total) by mouth daily before breakfast.        ----------------------------------------------------------------------------------------------------------------------  Problem List Items Addressed This Visit       Unprioritized   Hand pain (Chronic)   Chronic low back pain -  Primary   Relevant Medications   HYDROcodone-acetaminophen (NORCO) 10-325 MG tablet (Start on 07/10/2022)   fentaNYL (DURAGESIC) 50 MCG/HR (Start on 07/10/2022)   HYDROcodone-acetaminophen (NORCO) 10-325 MG tablet (Start on 08/09/2022)   fentaNYL (DURAGESIC) 50 MCG/HR (Start on 08/09/2022)   Chronic pain syndrome   Relevant Medications   HYDROcodone-acetaminophen (NORCO) 10-325 MG tablet (Start on 07/10/2022)   fentaNYL (DURAGESIC) 50 MCG/HR (Start on 07/10/2022)   HYDROcodone-acetaminophen ([place]) 10-325 MG tablet (Start on 08/09/2022)   fentaNYL (DURAGESIC) 50 MCG/HR (Start on 08/09/2022)   Other Relevant Orders   ToxASSURE Select 13 (MW), Urine   Chronic, continuous use of opioids   Relevant Orders   ToxASSURE Select 13 (MW), Urine   DDD (degenerative disc disease), lumbar   Relevant Medications   HYDROcodone-acetaminophen (NORCO) 10-325 MG tablet (Start on 07/10/2022)   fentaNYL ([place]) 50 MCG/HR (Start on 07/10/2022)   HYDROcodone-acetaminophen (NORCO) 10-325 MG tablet (Start on 08/09/2022)   fentaNYL ([place]) 50 MCG/HR (Start on 08/09/2022)   DJD (degenerative joint disease) of knee   Relevant Medications   HYDROcodone-acetaminophen (NORCO) 10-325 MG tablet (Start on 07/10/2022)   fentaNYL  (DURAGESIC) 50 MCG/HR (Start on 07/10/2022)   HYDROcodone-acetaminophen ([place]) 10-325 MG tablet (Start on 08/09/2022)   fentaNYL ([place]) 50 MCG/HR (Start on 08/09/2022)   Facet syndrome, lumbar   Relevant Medications   HYDROcodone-acetaminophen (NORCO) 10-325 MG tablet (Start on 07/10/2022)   fentaNYL (DURAGESIC) 50 MCG/HR (Start on 07/10/2022)   HYDROcodone-acetaminophen (NORCO) 10-325 MG tablet (Start on 08/09/2022)   fentaNYL (DURAGESIC) 50 MCG/HR (Start on 08/09/2022)   Knee pain, bilateral      ----------------------------------------------------------------------------------------------------------------------  1. Chronic bilateral low back pain without sciatica Continue core stretching strengthening exercises we will defer on any injections at this time per patient request  2. DDD (degenerative disc disease), lumbar As above  3. Facet syndrome, lumbar   4. Primary osteoarthritis of both knees He is having some swelling in his left knee and is in need of orthopedic evaluation and I have recommended he follow-up with Dr. Roland Rack at [facility] for evaluation.  5. Chronic pain of both knees   6. Chronic pain syndrome I have reviewed the [facility] practitioner database information is appropriate for refills.  His be dated for 1109 and 1209.  Furthermore we will going to start him on Linzess for his opioid constipation - ToxASSURE Select 13 (MW), Urine  7. Chronic, continuous use of opioids As above - ToxASSURE Select 13 (MW), Urine  8. Pain in both hands     ----------------------------------------------------------------------------------------------------------------------  I am having Tanner Baker start on HYDROcodone-acetaminophen, fentaNYL, and linaclotide. I am also having him maintain his propranolol, traZODone, Cholecalciferol (VITAMIN D-3 PO), SUMAtriptan, ranitidine, hydrochlorothiazide, latanoprost, fluticasone, vitamin C, diclofenac Sodium,  topiramate, Dulcolax, famotidine, naloxone, ondansetron, scopolamine, PARoxetine, gabapentin, tamsulosin, HYDROcodone-acetaminophen, and fentaNYL.   Meds ordered this encounter  Medications   HYDROcodone-acetaminophen (NORCO) 10-325 MG tablet    Sig: Take 1 tablet by mouth every 6 (six) hours as needed for moderate pain or severe pain.    Dispense:  90 tablet    Refill:  0   fentaNYL (DURAGESIC) 50 MCG/HR    Sig: Place 1 patch onto the skin every 3 (three) days.    Dispense:  10 patch    Refill:  0   HYDROcodone-acetaminophen (NORCO) 10-325 MG tablet    Sig: Take 1 tablet by mouth every 8 (eight) hours as needed for moderate pain or severe pain.    Dispense:  90 tablet  Refill:  0   fentaNYL (DURAGESIC) 50 MCG/HR    Sig: Place 1 patch onto the skin every 3 (three) days.    Dispense:  10 patch    Refill:  0   linaclotide (LINZESS) 72 MCG capsule    Sig: Take 1 capsule (72 mcg total) by mouth daily before breakfast.    Dispense:  30 capsule    Refill:  1   Patient's Medications  New Prescriptions   FENTANYL (DURAGESIC) 50 MCG/HR    Place 1 patch onto the skin every 3 (three) days.   HYDROCODONE-ACETAMINOPHEN (NORCO) 10-325 MG TABLET    Take 1 tablet by mouth every 8 (eight) hours as needed for moderate pain or severe pain.   LINACLOTIDE (LINZESS) 72 MCG CAPSULE    Take 1 capsule (72 mcg total) by mouth daily before breakfast.  Previous Medications   ASCORBIC ACID (VITAMIN C) 100 MG TABLET    Take 100 mg by mouth daily.   BISACODYL (DULCOLAX) 5 MG EC TABLET    Take 2 tablets (10 mg total) by mouth daily as needed for moderate constipation.   CHOLECALCIFEROL (VITAMIN D-3 PO)    Take 1,000 Int'l Units/day by mouth every morning. Reported on 01/01/2016   DICLOFENAC SODIUM (VOLTAREN) 1 % GEL    Apply topically.   FAMOTIDINE (PEPCID) 20 MG TABLET    Take 20 mg by mouth 2 (two) times daily.   FLUTICASONE (FLONASE) 50 MCG/ACT NASAL SPRAY    Place 1 spray into both nostrils daily as needed.  Reported on 03/21/2016   GABAPENTIN (NEURONTIN) 100 MG CAPSULE    Take 1 capsule (100 mg total) by mouth 3 (three) times daily.   HYDROCHLOROTHIAZIDE (HYDRODIURIL) 25 MG TABLET    Take 25 mg by mouth daily.   LATANOPROST (XALATAN) 0.005 % OPHTHALMIC SOLUTION    Place 2 drops into both eyes at bedtime.    NALOXONE (NARCAN) NASAL SPRAY 4 MG/0.1 ML    SMARTSIG:Both Nares   ONDANSETRON (ZOFRAN) 4 MG TABLET    Take 1 tablet by mouth every 6 (six) hours as needed.   PAROXETINE (PAXIL) 30 MG TABLET    Take 1 tablet by mouth daily.   PROPRANOLOL (INDERAL) 20 MG TABLET    Take 60 mg by mouth 2 (two) times daily.    RANITIDINE (ZANTAC) 150 MG TABLET    Take 150 mg by mouth 2 (two) times daily.   SCOPOLAMINE (TRANSDERM-SCOP) 1 MG/3DAYS    1 patch every 3 (three) days.   SUMATRIPTAN (IMITREX) 100 MG TABLET    Take 100 mg by mouth once. May repeat in 2 hours if headache persists or recurs.   TAMSULOSIN (FLOMAX) 0.4 MG CAPS CAPSULE    Take 1 capsule (0.4 mg total) by mouth daily.   TOPIRAMATE (TOPAMAX) 50 MG TABLET    Take 50 mg by mouth 2 (two) times daily.   TRAZODONE (DESYREL) 50 MG TABLET    Take 100 mg by mouth at bedtime as needed for sleep. Take 1-2 tablets at bedtime as needed  Modified Medications   Modified Medication Previous Medication   FENTANYL (DURAGESIC) 50 MCG/HR fentaNYL (DURAGESIC) 50 MCG/HR      Place 1 patch onto the skin every 3 (three) days.    Place 1 patch onto the skin every 3 (three) days.   HYDROCODONE-ACETAMINOPHEN (NORCO) 10-325 MG TABLET HYDROcodone-acetaminophen (NORCO) 10-325 MG tablet      Take 1 tablet by mouth every 6 (six) hours as needed for moderate  pain or severe pain.    Take 1 tablet by mouth every 6 (six) hours as needed for moderate pain or severe pain.  Discontinued Medications   No medications on file   ----------------------------------------------------------------------------------------------------------------------  Follow-up: Return in about 2 months  (around 08/18/2022) for evaluation, med refill.    Molli Barrows, MD

## 2022-06-18 NOTE — Progress Notes (Signed)
Nursing Pain Medication Assessment:  Safety precautions to be maintained throughout the outpatient stay will include: orient to surroundings, keep bed in low position, maintain call bell within reach at all times, provide assistance with transfer out of bed and ambulation. Nursing Pain Medication Assessment:  Safety precautions to be maintained throughout the outpatient stay will include: orient to surroundings, keep bed in low position, maintain call bell within reach at all times, provide assistance with transfer out of bed and ambulation.  Medication Inspection Compliance: Tanner Baker did not comply with our request to bring his pills to be counted. He was reminded that bringing the medication bottles, even when empty, is a requirement.  Medication: None brought in. Pill/Patch Count: None available to be counted. Bottle Appearance: No container available. Did not bring bottle(s) to appointment. Filled Date: N/A Last Medication intake:  Today

## 2022-06-25 DIAGNOSIS — G894 Chronic pain syndrome: Secondary | ICD-10-CM | POA: Diagnosis not present

## 2022-06-25 DIAGNOSIS — I10 Essential (primary) hypertension: Secondary | ICD-10-CM | POA: Diagnosis not present

## 2022-06-25 DIAGNOSIS — M1712 Unilateral primary osteoarthritis, left knee: Secondary | ICD-10-CM | POA: Diagnosis not present

## 2022-06-25 DIAGNOSIS — F325 Major depressive disorder, single episode, in full remission: Secondary | ICD-10-CM | POA: Diagnosis not present

## 2022-06-25 LAB — TOXASSURE SELECT 13 (MW), URINE

## 2022-06-30 DIAGNOSIS — G894 Chronic pain syndrome: Secondary | ICD-10-CM | POA: Diagnosis not present

## 2022-06-30 DIAGNOSIS — I10 Essential (primary) hypertension: Secondary | ICD-10-CM | POA: Diagnosis not present

## 2022-06-30 DIAGNOSIS — F325 Major depressive disorder, single episode, in full remission: Secondary | ICD-10-CM | POA: Diagnosis not present

## 2022-07-04 DIAGNOSIS — M1712 Unilateral primary osteoarthritis, left knee: Secondary | ICD-10-CM | POA: Diagnosis not present

## 2022-07-11 DIAGNOSIS — M1712 Unilateral primary osteoarthritis, left knee: Secondary | ICD-10-CM | POA: Diagnosis not present

## 2022-07-18 DIAGNOSIS — M1712 Unilateral primary osteoarthritis, left knee: Secondary | ICD-10-CM | POA: Diagnosis not present

## 2022-07-28 DIAGNOSIS — R509 Fever, unspecified: Secondary | ICD-10-CM | POA: Diagnosis not present

## 2022-07-28 DIAGNOSIS — G894 Chronic pain syndrome: Secondary | ICD-10-CM | POA: Diagnosis not present

## 2022-07-28 DIAGNOSIS — I10 Essential (primary) hypertension: Secondary | ICD-10-CM | POA: Diagnosis not present

## 2022-07-28 DIAGNOSIS — Z21 Asymptomatic human immunodeficiency virus [HIV] infection status: Secondary | ICD-10-CM | POA: Diagnosis not present

## 2022-07-28 DIAGNOSIS — R399 Unspecified symptoms and signs involving the genitourinary system: Secondary | ICD-10-CM | POA: Diagnosis not present

## 2022-08-06 DIAGNOSIS — F325 Major depressive disorder, single episode, in full remission: Secondary | ICD-10-CM | POA: Diagnosis not present

## 2022-08-06 DIAGNOSIS — I10 Essential (primary) hypertension: Secondary | ICD-10-CM | POA: Diagnosis not present

## 2022-08-06 DIAGNOSIS — G894 Chronic pain syndrome: Secondary | ICD-10-CM | POA: Diagnosis not present

## 2022-08-08 DIAGNOSIS — I10 Essential (primary) hypertension: Secondary | ICD-10-CM | POA: Diagnosis not present

## 2022-08-08 DIAGNOSIS — Z125 Encounter for screening for malignant neoplasm of prostate: Secondary | ICD-10-CM | POA: Diagnosis not present

## 2022-08-12 ENCOUNTER — Ambulatory Visit: Payer: Medicare HMO | Attending: Anesthesiology | Admitting: Anesthesiology

## 2022-08-12 ENCOUNTER — Encounter: Payer: Self-pay | Admitting: Anesthesiology

## 2022-08-12 ENCOUNTER — Telehealth: Payer: Self-pay | Admitting: Anesthesiology

## 2022-08-12 DIAGNOSIS — M1990 Unspecified osteoarthritis, unspecified site: Secondary | ICD-10-CM

## 2022-08-12 DIAGNOSIS — M170 Bilateral primary osteoarthritis of knee: Secondary | ICD-10-CM

## 2022-08-12 DIAGNOSIS — G8929 Other chronic pain: Secondary | ICD-10-CM

## 2022-08-12 DIAGNOSIS — M5136 Other intervertebral disc degeneration, lumbar region: Secondary | ICD-10-CM

## 2022-08-12 DIAGNOSIS — F1190 Opioid use, unspecified, uncomplicated: Secondary | ICD-10-CM

## 2022-08-12 DIAGNOSIS — M79641 Pain in right hand: Secondary | ICD-10-CM

## 2022-08-12 DIAGNOSIS — M545 Low back pain, unspecified: Secondary | ICD-10-CM

## 2022-08-12 DIAGNOSIS — G894 Chronic pain syndrome: Secondary | ICD-10-CM

## 2022-08-12 DIAGNOSIS — M47816 Spondylosis without myelopathy or radiculopathy, lumbar region: Secondary | ICD-10-CM

## 2022-08-12 MED ORDER — MORPHINE SULFATE ER BEADS 90 MG PO CP24: 90.0000 mg | ORAL_CAPSULE | Freq: Every day | ORAL | 0 refills | Status: AC

## 2022-08-12 NOTE — Telephone Encounter (Signed)
Spoke with Ranan at pharmacy,  insurance will require PA for capsules but not on tablets.  The dosing could be achieved by giving 3 30 mg tablets to equal the 90 mg that is ordered. I have sent JA a message to see what he would like to do.

## 2022-08-12 NOTE — Telephone Encounter (Signed)
Ranan at [facility] [phone] says patient needs PA to get the capsules for his pain meds. The insurance will cover  Tablets instead of capsules. Would Dr Andree Elk want to do that instead. Please call Ranan and let her know.

## 2022-08-12 NOTE — Progress Notes (Signed)
Virtual Visit via Telephone Note  I connected with Tanner Baker on 08/12/22 at  8:20 AM EST by telephone and verified that I am speaking with the correct person using two identifiers.  Location: Patient: Home Provider: Pain control center   I discussed the limitations, risks, security and privacy concerns of performing an evaluation and management service by telephone and the availability of in person appointments. I also discussed with the patient that there may be a patient responsible charge related to this service. The patient expressed understanding and agreed to proceed.   History of Present Illness: I spoke with Tanner Baker for his virtual visit.  He was unable to do the video portion of this.  He reports that he is doing well in regards to his low back pain and diffuse body pain.  The quality characteristic and distribution of this has remained stable.  He still taking his medications as prescribed and these continue to work well for him.  Switch over to the morphine extended release has given better coverage throughout the day and he is able to be more active with less pain.  He is trying to do his physical therapy as requested.  No side effects reported.  Review of systems: General: No fevers or chills Pulmonary: No shortness of breath or dyspnea Cardiac: No angina or palpitations or lightheadedness GI: No abdominal pain or constipation Psych: No depression    Observations/Objective:  Current Outpatient Medications:    Ascorbic Acid (VITAMIN C) 100 MG tablet, Take 100 mg by mouth daily. (Patient not taking: Reported on 06/18/2022), Disp: , Rfl:    bisacodyl (DULCOLAX) 5 MG EC tablet, Take 2 tablets (10 mg total) by mouth daily as needed for moderate constipation., Disp: 30 tablet, Rfl: 0   Cholecalciferol (VITAMIN D-3 PO), Take 1,000 Int'l Units/day by mouth every morning. Reported on 01/01/2016 (Patient not taking: Reported on 06/18/2022), Disp: , Rfl:    diclofenac Sodium  (VOLTAREN) 1 % GEL, Apply topically., Disp: , Rfl:    famotidine (PEPCID) 20 MG tablet, Take 20 mg by mouth 2 (two) times daily., Disp: , Rfl:    fluticasone (FLONASE) 50 MCG/ACT nasal spray, Place 1 spray into both nostrils daily as needed. Reported on 03/21/2016, Disp: , Rfl:    gabapentin (NEURONTIN) 100 MG capsule, Take 1 capsule (100 mg total) by mouth 3 (three) times daily., Disp: 90 capsule, Rfl: 3   hydrochlorothiazide (HYDRODIURIL) 25 MG tablet, Take 25 mg by mouth daily., Disp: , Rfl:    latanoprost (XALATAN) 0.005 % ophthalmic solution, Place 2 drops into both eyes at bedtime. , Disp: , Rfl:    linaclotide (LINZESS) 72 MCG capsule, Take 1 capsule (72 mcg total) by mouth daily before breakfast., Disp: 30 capsule, Rfl: 1   morphine (MS CONTIN) 30 MG 12 hr tablet, Take 1 tablet (30 mg total) by mouth every 8 (eight) hours., Disp: 90 tablet, Rfl: 0   [START ON 04/12/2023] morphine (MS CONTIN) 30 MG 12 hr tablet, Take 1 tablet (30 mg total) by mouth every 8 (eight) hours., Disp: 90 tablet, Rfl: 0   naloxone (NARCAN) nasal spray 4 mg/0.1 mL, SMARTSIG:Both Nares, Disp: , Rfl:    ondansetron (ZOFRAN) 4 MG tablet, Take 1 tablet by mouth every 6 (six) hours as needed., Disp: , Rfl:    PARoxetine (PAXIL) 30 MG tablet, Take 1 tablet by mouth daily., Disp: , Rfl:    propranolol (INDERAL) 20 MG tablet, Take 60 mg by mouth 2 (two) times daily. ,  Disp: , Rfl:    ranitidine (ZANTAC) 150 MG tablet, Take 150 mg by mouth 2 (two) times daily., Disp: , Rfl:    scopolamine (TRANSDERM-SCOP) 1 MG/3DAYS, 1 patch every 3 (three) days., Disp: , Rfl:    SUMAtriptan (IMITREX) 100 MG tablet, Take 100 mg by mouth once. May repeat in 2 hours if headache persists or recurs., Disp: , Rfl:    tamsulosin (FLOMAX) 0.4 MG CAPS capsule, Take 1 capsule (0.4 mg total) by mouth daily., Disp: 30 capsule, Rfl: 11   topiramate (TOPAMAX) 50 MG tablet, Take 50 mg by mouth 2 (two) times daily., Disp: , Rfl:    traZODone (DESYREL) 50 MG  tablet, Take 100 mg by mouth at bedtime as needed for sleep. Take 1-2 tablets at bedtime as needed, Disp: , Rfl:    Past Medical History:  Diagnosis Date   Anxiety    Cancer (HCC)    skin cancer   Chronic low back pain 05/09/2021   Chronic pain syndrome    Chronic, continuous use of opioids 05/09/2021   DDD (degenerative disc disease), cervical    Depression    Depression    Glaucoma    Heartburn    Hypertension    Kidney stone    Kidney stones    Knee pain, bilateral 05/09/2021   Migraine    Migraine    Post-traumatic headache    Sleep apnea    Traumatic arthritis      Assessment and Plan: 1. Chronic bilateral low back pain without sciatica   2. DDD (degenerative disc disease), lumbar   3. Facet syndrome, lumbar   4. Primary osteoarthritis of both knees   5. Chronic pain of both knees   6. Chronic pain syndrome   7. Chronic, continuous use of opioids   8. Pain in both hands   9. Arthritis   No other changes in his regimen are initiated.  I have reviewed the [facility] practitioner database information I think is appropriate for refill.  He has been very stable on his current regimen.  I have encouraged him to continue with stretching strengthening exercises with return to clinic scheduled in 2 months.  He is to continue follow-up with his primary care physicians for baseline medical care.  Follow Up Instructions:    I discussed the assessment and treatment plan with the patient. The patient was provided an opportunity to ask questions and all were answered. The patient agreed with the plan and demonstrated an understanding of the instructions.   The patient was advised to call back or seek an in-person evaluation if the symptoms worsen or if the condition fails to improve as anticipated.  I provided 30 minutes of non-face-to-face time during this encounter.   Yevette Edwards, MD

## 2022-08-14 ENCOUNTER — Telehealth: Payer: Self-pay | Admitting: Anesthesiology

## 2022-08-14 ENCOUNTER — Other Ambulatory Visit: Payer: Self-pay

## 2022-08-14 MED ORDER — MORPHINE SULFATE ER BEADS 30 MG PO CP24: 30.0000 mg | ORAL_CAPSULE | Freq: Three times a day (TID) | ORAL | 0 refills | Status: AC

## 2022-08-14 MED ORDER — MORPHINE SULFATE ER 30 MG PO TBCR: 30.0000 mg | EXTENDED_RELEASE_TABLET | Freq: Three times a day (TID) | ORAL | 0 refills | Status: DC

## 2022-08-14 NOTE — Telephone Encounter (Signed)
Both patient and pharmacy are calling about script for morphine. The morphine capsules have to have PA and have to be ordered. They do have the tablets and can fill without PA. He would need new script and it would require him to take 3 tablets in place of 1 90 mg tab.  Please check with Dr Andree Elk

## 2022-08-14 NOTE — Telephone Encounter (Addendum)
Patient says pharmacy told him he needs authorization for his morphine. He says he will not have any to take for tomorrow. Wants someone to call him back when the PA is sent in.

## 2022-08-14 NOTE — Telephone Encounter (Signed)
Error

## 2022-08-14 NOTE — Telephone Encounter (Signed)
PT stated that pharmacy is waiting on PA from Dr. Quita Skye so he can pick up morphine prescription. PT stated that he needs to be able to pick up prescription today. PT stated that he is supposed to pick up prescription today because he suppose start taking medications tomorrow. PT asked to be call. Thanks

## 2022-08-14 NOTE — Telephone Encounter (Signed)
Dr Andree Elk will be sending in a new script for Morphine 30 mg tablets.  Patient notified.

## 2022-08-15 DIAGNOSIS — I10 Essential (primary) hypertension: Secondary | ICD-10-CM | POA: Diagnosis not present

## 2022-08-15 DIAGNOSIS — G894 Chronic pain syndrome: Secondary | ICD-10-CM | POA: Diagnosis not present

## 2022-08-15 DIAGNOSIS — F325 Major depressive disorder, single episode, in full remission: Secondary | ICD-10-CM | POA: Diagnosis not present

## 2022-08-15 DIAGNOSIS — G43809 Other migraine, not intractable, without status migrainosus: Secondary | ICD-10-CM | POA: Diagnosis not present

## 2022-08-20 ENCOUNTER — Telehealth: Payer: Self-pay | Admitting: Anesthesiology

## 2022-08-20 NOTE — Telephone Encounter (Signed)
noted 

## 2022-08-20 NOTE — Telephone Encounter (Signed)
PT wanted to let Dr Andree Elk know that the morphine medication has been working out well. Thanks

## 2022-09-09 DIAGNOSIS — I10 Essential (primary) hypertension: Secondary | ICD-10-CM | POA: Diagnosis not present

## 2022-09-09 DIAGNOSIS — G894 Chronic pain syndrome: Secondary | ICD-10-CM | POA: Diagnosis not present

## 2022-09-09 DIAGNOSIS — F325 Major depressive disorder, single episode, in full remission: Secondary | ICD-10-CM | POA: Diagnosis not present

## 2022-09-11 ENCOUNTER — Encounter: Payer: Self-pay | Admitting: Anesthesiology

## 2022-09-11 ENCOUNTER — Ambulatory Visit: Payer: Medicare HMO | Attending: Anesthesiology | Admitting: Anesthesiology

## 2022-09-11 DIAGNOSIS — M25561 Pain in right knee: Secondary | ICD-10-CM

## 2022-09-11 DIAGNOSIS — M25562 Pain in left knee: Secondary | ICD-10-CM

## 2022-09-11 DIAGNOSIS — M5136 Other intervertebral disc degeneration, lumbar region: Secondary | ICD-10-CM | POA: Diagnosis not present

## 2022-09-11 DIAGNOSIS — M79642 Pain in left hand: Secondary | ICD-10-CM

## 2022-09-11 DIAGNOSIS — M51369 Other intervertebral disc degeneration, lumbar region without mention of lumbar back pain or lower extremity pain: Secondary | ICD-10-CM

## 2022-09-11 DIAGNOSIS — M170 Bilateral primary osteoarthritis of knee: Secondary | ICD-10-CM

## 2022-09-11 DIAGNOSIS — M545 Low back pain, unspecified: Secondary | ICD-10-CM | POA: Diagnosis not present

## 2022-09-11 DIAGNOSIS — M47816 Spondylosis without myelopathy or radiculopathy, lumbar region: Secondary | ICD-10-CM

## 2022-09-11 DIAGNOSIS — F1190 Opioid use, unspecified, uncomplicated: Secondary | ICD-10-CM

## 2022-09-11 DIAGNOSIS — M79641 Pain in right hand: Secondary | ICD-10-CM

## 2022-09-11 DIAGNOSIS — G8929 Other chronic pain: Secondary | ICD-10-CM

## 2022-09-11 DIAGNOSIS — Z79891 Long term (current) use of opiate analgesic: Secondary | ICD-10-CM | POA: Diagnosis not present

## 2022-09-11 DIAGNOSIS — G894 Chronic pain syndrome: Secondary | ICD-10-CM

## 2022-09-11 MED ORDER — BUTALBITAL-APAP-CAFFEINE 50-325-40 MG PO TABS: 1.0000 | ORAL_TABLET | Freq: Four times a day (QID) | ORAL | 0 refills | Status: DC | PRN

## 2022-09-11 MED ORDER — MORPHINE SULFATE ER 30 MG PO TBCR: 30.0000 mg | EXTENDED_RELEASE_TABLET | Freq: Three times a day (TID) | ORAL | 0 refills | Status: AC

## 2022-09-11 MED ORDER — GABAPENTIN 100 MG PO CAPS: 100.0000 mg | ORAL_CAPSULE | Freq: Three times a day (TID) | ORAL | 3 refills | Status: DC

## 2022-09-11 MED ORDER — MORPHINE SULFATE ER 30 MG PO TBCR: 30.0000 mg | EXTENDED_RELEASE_TABLET | Freq: Three times a day (TID) | ORAL | 0 refills | Status: DC

## 2022-09-11 NOTE — Progress Notes (Signed)
Virtual Visit via Telephone Note  I connected with Tanner Baker on 09/11/22 at  1:00 PM EST by telephone and verified that I am speaking with the correct person using two identifiers.  Location: Patient: Home Provider: Pain control center   I discussed the limitations, risks, security and privacy concerns of performing an evaluation and management service by telephone and the availability of in person appointments. I also discussed with the patient that there may be a patient responsible charge related to this service. The patient expressed understanding and agreed to proceed.   History of Present Illness: I spoke with Tanner Baker via telephone as we are unable like for the video portion the conference.  He reports that he is doing quite well with this new change to MS Contin 30 mg tablets taking this 3 times a day.  He was able to transition from the Duragesic and hydrocodone to this regimen and it is working better for him.  He feels much better feels more energetic as best pain breakthrough during the day.  He has been quite pleased.  He is also having less constipation.  The quality characteristic and distribution of the pain when he has it is comparable to what he had in the past primarily affecting the low back legs with arthralgic type pain aching gnawing and occasionally neuralgic pain.  He continues to take his gabapentin 100 mg 3 times a day for that and it continues to work well for him.  Lower extremity strength is stable without change no problems with bowel or bladder function.Review of systems: General: No fevers or chills Pulmonary: No shortness of breath or dyspnea Cardiac: No angina or palpitations or lightheadedness GI: No abdominal pain or constipation Psych: No depression    Observations/Objective:   Current Outpatient Medications:    [START ON 10/13/2022] morphine (MS CONTIN) 30 MG 12 hr tablet, Take 1 tablet (30 mg total) by mouth 3 (three) times daily after meals., Disp: 90  tablet, Rfl: 0   Ascorbic Acid (VITAMIN C) 100 MG tablet, Take 100 mg by mouth daily. (Patient not taking: Reported on 06/18/2022), Disp: , Rfl:    bisacodyl (DULCOLAX) 5 MG EC tablet, Take 2 tablets (10 mg total) by mouth daily as needed for moderate constipation., Disp: 30 tablet, Rfl: 0   Cholecalciferol (VITAMIN D-3 PO), Take 1,000 Int'l Units/day by mouth every morning. Reported on 01/01/2016 (Patient not taking: Reported on 06/18/2022), Disp: , Rfl:    diclofenac Sodium (VOLTAREN) 1 % GEL, Apply topically., Disp: , Rfl:    famotidine (PEPCID) 20 MG tablet, Take 20 mg by mouth 2 (two) times daily., Disp: , Rfl:    fluticasone (FLONASE) 50 MCG/ACT nasal spray, Place 1 spray into both nostrils daily as needed. Reported on 03/21/2016, Disp: , Rfl:    gabapentin (NEURONTIN) 100 MG capsule, Take 1 capsule (100 mg total) by mouth 3 (three) times daily., Disp: 90 capsule, Rfl: 3   hydrochlorothiazide (HYDRODIURIL) 25 MG tablet, Take 25 mg by mouth daily., Disp: , Rfl:    latanoprost (XALATAN) 0.005 % ophthalmic solution, Place 2 drops into both eyes at bedtime. , Disp: , Rfl:    linaclotide (LINZESS) 72 MCG capsule, Take 1 capsule (72 mcg total) by mouth daily before breakfast., Disp: 30 capsule, Rfl: 1   morphine (AVINZA) 30 MG 24 hr capsule, Take 1 capsule (30 mg total) by mouth 3 (three) times daily after meals., Disp: 90 capsule, Rfl: 0   morphine (AVINZA) 90 MG 24 hr capsule, Take  1 capsule (90 mg total) by mouth daily., Disp: 30 capsule, Rfl: 0   [START ON 09/13/2022] morphine (MS CONTIN) 30 MG 12 hr tablet, Take 1 tablet (30 mg total) by mouth 3 (three) times daily after meals., Disp: 90 tablet, Rfl: 0   naloxone (NARCAN) nasal spray 4 mg/0.1 mL, SMARTSIG:Both Nares, Disp: , Rfl:    ondansetron (ZOFRAN) 4 MG tablet, Take 1 tablet by mouth every 6 (six) hours as needed., Disp: , Rfl:    PARoxetine (PAXIL) 30 MG tablet, Take 1 tablet by mouth daily., Disp: , Rfl:    propranolol (INDERAL) 20 MG  tablet, Take 60 mg by mouth 2 (two) times daily. , Disp: , Rfl:    ranitidine (ZANTAC) 150 MG tablet, Take 150 mg by mouth 2 (two) times daily., Disp: , Rfl:    scopolamine (TRANSDERM-SCOP) 1 MG/3DAYS, 1 patch every 3 (three) days., Disp: , Rfl:    SUMAtriptan (IMITREX) 100 MG tablet, Take 100 mg by mouth once. May repeat in 2 hours if headache persists or recurs., Disp: , Rfl:    tamsulosin (FLOMAX) 0.4 MG CAPS capsule, Take 1 capsule (0.4 mg total) by mouth daily., Disp: 30 capsule, Rfl: 11   topiramate (TOPAMAX) 50 MG tablet, Take 50 mg by mouth 2 (two) times daily., Disp: , Rfl:    traZODone (DESYREL) 50 MG tablet, Take 100 mg by mouth at bedtime as needed for sleep. Take 1-2 tablets at bedtime as needed, Disp: , Rfl:    Past Medical History:  Diagnosis Date   Anxiety    Cancer ([place])    skin cancer   Chronic low back pain 05/09/2021   Chronic pain syndrome    Chronic, continuous use of opioids 05/09/2021   DDD (degenerative disc disease), cervical    Depression    Depression    Glaucoma    Heartburn    Hypertension    Kidney stone    Kidney stones    Knee pain, bilateral 05/09/2021   Migraine    Migraine    Post-traumatic headache    Sleep apnea    Traumatic arthritis     Assessment and Plan:  1. Chronic bilateral low back pain without sciatica   2. DDD (degenerative disc disease), lumbar   3. Facet syndrome, lumbar   4. Primary osteoarthritis of both knees   5. Chronic pain syndrome   6. Chronic pain of both knees   7. Chronic, continuous use of opioids   8. Pain in both hands   Based on our conversation and after review of the [facility] practitioner database information I think is appropriate to refill his medicines for the next 2 months.  This to be for January 13 and February 12.  No other changes in his regimen will be initiated.  I have also refilled his gabapentin.  This protocol seems to be working well for him.  Will have him return to clinic in 2 months and  continue follow-up with his primary care physicians for baseline medical care. Follow Up Instructions:    I discussed the assessment and treatment plan with the patient. The patient was provided an opportunity to ask questions and all were answered. The patient agreed with the plan and demonstrated an understanding of the instructions.   The patient was advised to call back or seek an in-person evaluation if the symptoms worsen or if the condition fails to improve as anticipated.  I provided 30 minutes of non-face-to-face time during this encounter.  Molli Barrows, MD

## 2022-10-13 ENCOUNTER — Telehealth: Payer: Self-pay

## 2022-10-13 NOTE — Telephone Encounter (Signed)
The pharmacy has questions about his prescriptions. They will not give him his meds until someone calls them back. [facility] [phone]

## 2022-10-13 NOTE — Telephone Encounter (Signed)
Spoke with Pharmacist Ronan and she states the patient is trying to fill the Fetanyl and Hydrocodone and also had MS contin.  Informed her to DC the Fentanyl and Hydrocodone at this time and I will clarify with Dr Andree Elk when he comes in today. Looks like in his note he has changed over to MS contin.

## 2022-10-30 ENCOUNTER — Other Ambulatory Visit: Payer: Self-pay | Admitting: Anesthesiology

## 2022-11-11 ENCOUNTER — Encounter: Payer: Self-pay | Admitting: Anesthesiology

## 2022-11-11 ENCOUNTER — Ambulatory Visit: Payer: Medicare HMO | Attending: Anesthesiology | Admitting: Anesthesiology

## 2022-11-11 DIAGNOSIS — M5136 Other intervertebral disc degeneration, lumbar region: Secondary | ICD-10-CM | POA: Diagnosis not present

## 2022-11-11 DIAGNOSIS — M79641 Pain in right hand: Secondary | ICD-10-CM

## 2022-11-11 DIAGNOSIS — G8929 Other chronic pain: Secondary | ICD-10-CM

## 2022-11-11 DIAGNOSIS — F1190 Opioid use, unspecified, uncomplicated: Secondary | ICD-10-CM | POA: Diagnosis not present

## 2022-11-11 DIAGNOSIS — M51369 Other intervertebral disc degeneration, lumbar region without mention of lumbar back pain or lower extremity pain: Secondary | ICD-10-CM

## 2022-11-11 DIAGNOSIS — M25561 Pain in right knee: Secondary | ICD-10-CM

## 2022-11-11 DIAGNOSIS — M1990 Unspecified osteoarthritis, unspecified site: Secondary | ICD-10-CM | POA: Diagnosis not present

## 2022-11-11 DIAGNOSIS — M79642 Pain in left hand: Secondary | ICD-10-CM

## 2022-11-11 DIAGNOSIS — M170 Bilateral primary osteoarthritis of knee: Secondary | ICD-10-CM | POA: Diagnosis not present

## 2022-11-11 DIAGNOSIS — M47816 Spondylosis without myelopathy or radiculopathy, lumbar region: Secondary | ICD-10-CM | POA: Diagnosis not present

## 2022-11-11 DIAGNOSIS — G894 Chronic pain syndrome: Secondary | ICD-10-CM

## 2022-11-11 DIAGNOSIS — M545 Low back pain, unspecified: Secondary | ICD-10-CM

## 2022-11-11 DIAGNOSIS — M25562 Pain in left knee: Secondary | ICD-10-CM

## 2022-11-11 MED ORDER — MORPHINE SULFATE ER 30 MG PO TBCR: 30.0000 mg | EXTENDED_RELEASE_TABLET | Freq: Three times a day (TID) | ORAL | 0 refills | Status: DC

## 2022-11-11 MED ORDER — MORPHINE SULFATE ER 30 MG PO TBCR: 30.0000 mg | EXTENDED_RELEASE_TABLET | Freq: Three times a day (TID) | ORAL | 0 refills | Status: AC

## 2022-11-11 NOTE — Progress Notes (Signed)
Virtual Visit via Telephone Note  I connected with Tanner Baker on 11/11/22 at  2:00 PM EDT by telephone and verified that I am speaking with the correct person using two identifiers.  Location: Patient: Home Provider: Pain control   I discussed the limitations, risks, security and privacy concerns of performing an evaluation and management service by telephone and the availability of in person appointments. I also discussed with the patient that there may be a patient responsible charge related to this service. The patient expressed understanding and agreed to proceed.   History of Present Illness: I spoke with Tanner Baker via telephone as we could not link for the video portion of the conference.  He reports that his pain in the back hands hips and legs is stable in nature with no recent changes.  He is taking his medication the MS Contin extended release 3 times a day and this is working much better for him than his previous opioid therapy.  He is having better pain control rated at 80 to 90% lasting about 6 hours or so before he has recurrence of the same pain.  No side effects are reported with the medication and he is very happy with his current state of affairs.  He is able to be more active and sleeps better at night.  No side effects are reported and the quality of the pain is stable in nature no change in lower extremity strength function bowel or bladder function is noted at this time.  Review of systems: General: No fevers or chills Pulmonary: No shortness of breath or dyspnea Cardiac: No angina or palpitations or lightheadedness GI: No abdominal pain or constipation Psych: No depression    Observations/Objective:  Current Outpatient Medications:    [START ON 12/12/2022] morphine (MS CONTIN) 30 MG 12 hr tablet, Take 1 tablet (30 mg total) by mouth every 8 (eight) hours., Disp: 90 tablet, Rfl: 0   Ascorbic Acid (VITAMIN C) 100 MG tablet, Take 100 mg by mouth daily. (Patient  not taking: Reported on 06/18/2022), Disp: , Rfl:    bisacodyl (DULCOLAX) 5 MG EC tablet, Take 2 tablets (10 mg total) by mouth daily as needed for moderate constipation., Disp: 30 tablet, Rfl: 0   Cholecalciferol (VITAMIN D-3 PO), Take 1,000 Int'l Units/day by mouth every morning. Reported on 01/01/2016 (Patient not taking: Reported on 06/18/2022), Disp: , Rfl:    diclofenac Sodium (VOLTAREN) 1 % GEL, Apply topically., Disp: , Rfl:    famotidine (PEPCID) 20 MG tablet, Take 20 mg by mouth 2 (two) times daily., Disp: , Rfl:    fluticasone (FLONASE) 50 MCG/ACT nasal spray, Place 1 spray into both nostrils daily as needed. Reported on 03/21/2016, Disp: , Rfl:    gabapentin (NEURONTIN) 100 MG capsule, Take 1 capsule (100 mg total) by mouth 3 (three) times daily., Disp: 90 capsule, Rfl: 3   hydrochlorothiazide (HYDRODIURIL) 25 MG tablet, Take 25 mg by mouth daily., Disp: , Rfl:    latanoprost (XALATAN) 0.005 % ophthalmic solution, Place 2 drops into both eyes at bedtime. , Disp: , Rfl:    linaclotide (LINZESS) 72 MCG capsule, Take 1 capsule (72 mcg total) by mouth daily before breakfast., Disp: 30 capsule, Rfl: 1   [START ON 11/12/2022] morphine (MS CONTIN) 30 MG 12 hr tablet, Take 1 tablet (30 mg total) by mouth 3 (three) times daily after meals., Disp: 90 tablet, Rfl: 0   naloxone (NARCAN) nasal spray 4 mg/0.1 mL, SMARTSIG:Both Nares, Disp: , Rfl:  ondansetron (ZOFRAN) 4 MG tablet, Take 1 tablet by mouth every 6 (six) hours as needed., Disp: , Rfl:    PARoxetine (PAXIL) 30 MG tablet, Take 1 tablet by mouth daily., Disp: , Rfl:    propranolol (INDERAL) 20 MG tablet, Take 60 mg by mouth 2 (two) times daily. , Disp: , Rfl:    ranitidine (ZANTAC) 150 MG tablet, Take 150 mg by mouth 2 (two) times daily., Disp: , Rfl:    scopolamine (TRANSDERM-SCOP) 1 MG/3DAYS, 1 patch every 3 (three) days., Disp: , Rfl:    SUMAtriptan (IMITREX) 100 MG tablet, Take 100 mg by mouth once. May repeat in 2 hours if headache  persists or recurs., Disp: , Rfl:    tamsulosin (FLOMAX) 0.4 MG CAPS capsule, Take 1 capsule (0.4 mg total) by mouth daily., Disp: 30 capsule, Rfl: 11   topiramate (TOPAMAX) 50 MG tablet, Take 50 mg by mouth 2 (two) times daily., Disp: , Rfl:    traZODone (DESYREL) 50 MG tablet, Take 100 mg by mouth at bedtime as needed for sleep. Take 1-2 tablets at bedtime as needed, Disp: , Rfl:    Past Medical History:  Diagnosis Date   Anxiety    Cancer ([place])    skin cancer   Chronic low back pain 05/09/2021   Chronic pain syndrome    Chronic, continuous use of opioids 05/09/2021   DDD (degenerative disc disease), cervical    Depression    Depression    Glaucoma    Heartburn    Hypertension    Kidney stone    Kidney stones    Knee pain, bilateral 05/09/2021   Migraine    Migraine    Post-traumatic headache    Sleep apnea    Traumatic arthritis      Assessment and Plan: 1. Chronic bilateral low back pain without sciatica   2. DDD (degenerative disc disease), lumbar   3. Facet syndrome, lumbar   4. Primary osteoarthritis of both knees   5. Chronic pain syndrome   6. Chronic pain of both knees   7. Chronic, continuous use of opioids   8. Pain in both hands   9. Arthritis   Based on our conversation and upon review of the [facility] practitioner database information it is appropriate to refill his medicines for the next 2 months.  No other changes in his pharmacologic regimen will be initiated.  I encouraged him to continue with stretching strengthening exercises and continue with gabapentin 100 mg 3 times a day as this combination is working very well for him.  Continue follow-up with his primary care physicians with return to clinic scheduled in 2 months.  Follow Up Instructions:    I discussed the assessment and treatment plan with the patient. The patient was provided an opportunity to ask questions and all were answered. The patient agreed with the plan and demonstrated an  understanding of the instructions.   The patient was advised to call back or seek an in-person evaluation if the symptoms worsen or if the condition fails to improve as anticipated.  I provided 30 minutes of non-face-to-face time during this encounter.   Molli Barrows, MD

## 2022-11-14 DIAGNOSIS — I10 Essential (primary) hypertension: Secondary | ICD-10-CM | POA: Diagnosis not present

## 2022-11-14 DIAGNOSIS — G894 Chronic pain syndrome: Secondary | ICD-10-CM | POA: Diagnosis not present

## 2022-11-14 DIAGNOSIS — F325 Major depressive disorder, single episode, in full remission: Secondary | ICD-10-CM | POA: Diagnosis not present

## 2023-01-07 ENCOUNTER — Encounter: Payer: Self-pay | Admitting: Anesthesiology

## 2023-01-07 ENCOUNTER — Ambulatory Visit: Payer: Medicare HMO | Attending: Anesthesiology | Admitting: Anesthesiology

## 2023-01-07 DIAGNOSIS — G8929 Other chronic pain: Secondary | ICD-10-CM

## 2023-01-07 DIAGNOSIS — G894 Chronic pain syndrome: Secondary | ICD-10-CM

## 2023-01-07 DIAGNOSIS — M5136 Other intervertebral disc degeneration, lumbar region: Secondary | ICD-10-CM | POA: Diagnosis not present

## 2023-01-07 DIAGNOSIS — F1190 Opioid use, unspecified, uncomplicated: Secondary | ICD-10-CM | POA: Diagnosis not present

## 2023-01-07 DIAGNOSIS — M79641 Pain in right hand: Secondary | ICD-10-CM

## 2023-01-07 DIAGNOSIS — M79642 Pain in left hand: Secondary | ICD-10-CM

## 2023-01-07 DIAGNOSIS — M47816 Spondylosis without myelopathy or radiculopathy, lumbar region: Secondary | ICD-10-CM | POA: Diagnosis not present

## 2023-01-07 DIAGNOSIS — M545 Low back pain, unspecified: Secondary | ICD-10-CM

## 2023-01-07 DIAGNOSIS — M51369 Other intervertebral disc degeneration, lumbar region without mention of lumbar back pain or lower extremity pain: Secondary | ICD-10-CM

## 2023-01-07 DIAGNOSIS — M170 Bilateral primary osteoarthritis of knee: Secondary | ICD-10-CM | POA: Diagnosis not present

## 2023-01-07 MED ORDER — MORPHINE SULFATE ER 30 MG PO TBCR: 30.0000 mg | EXTENDED_RELEASE_TABLET | Freq: Three times a day (TID) | ORAL | 0 refills | Status: AC

## 2023-01-07 MED ORDER — MORPHINE SULFATE ER 30 MG PO TBCR: 30.0000 mg | EXTENDED_RELEASE_TABLET | Freq: Three times a day (TID) | ORAL | 0 refills | Status: DC

## 2023-01-07 NOTE — Progress Notes (Signed)
Virtual Visit via Telephone Note  I connected with Tanner Baker on 01/07/23 at 11:00 AM EDT by telephone and verified that I am speaking with the correct person using two identifiers.  Location: Patient: Home Provider: Pain control center   I discussed the limitations, risks, security and privacy concerns of performing an evaluation and management service by telephone and the availability of in person appointments. I also discussed with the patient that there may be a patient responsible charge related to this service. The patient expressed understanding and agreed to proceed.   History of Present Illness: I was able to reach out to Tanner Baker and catch up with him via telephone.  We are unable link for the video portion of this conference.  He reports that he has been doing well with his current chronic opioid management regimen.  His back pain leg pain and wrist pain is stable in nature.  No recent changes in the quality characteristic or distribution are noted.  He still getting good relief taking his MS Contin 30 mg tablets 3 times a day.  No side effects reported.  He generally gets about 75 to 80% relief with out any other problems and the medications are enabling him to stay active and complete daily household routines.  Otherwise he is in his usual state of health. Review of systems: General: No fevers or chills Pulmonary: No shortness of breath or dyspnea Cardiac: No angina or palpitations or lightheadedness GI: No abdominal pain or constipation Psych: No depression     Observations/Objective:  Current Outpatient Medications:    [START ON 02/11/2023] morphine (MS CONTIN) 30 MG 12 hr tablet, Take 1 tablet (30 mg total) by mouth every 8 (eight) hours., Disp: 90 tablet, Rfl: 0   Ascorbic Acid (VITAMIN C) 100 MG tablet, Take 100 mg by mouth daily. (Patient not taking: Reported on 06/18/2022), Disp: , Rfl:    bisacodyl (DULCOLAX) 5 MG EC tablet, Take 2 tablets (10 mg total) by  mouth daily as needed for moderate constipation., Disp: 30 tablet, Rfl: 0   Cholecalciferol (VITAMIN D-3 PO), Take 1,000 Int'l Units/day by mouth every morning. Reported on 01/01/2016 (Patient not taking: Reported on 06/18/2022), Disp: , Rfl:    diclofenac Sodium (VOLTAREN) 1 % GEL, Apply topically., Disp: , Rfl:    famotidine (PEPCID) 20 MG tablet, Take 20 mg by mouth 2 (two) times daily., Disp: , Rfl:    fluticasone (FLONASE) 50 MCG/ACT nasal spray, Place 1 spray into both nostrils daily as needed. Reported on 03/21/2016, Disp: , Rfl:    gabapentin (NEURONTIN) 100 MG capsule, Take 1 capsule (100 mg total) by mouth 3 (three) times daily., Disp: 90 capsule, Rfl: 3   hydrochlorothiazide (HYDRODIURIL) 25 MG tablet, Take 25 mg by mouth daily., Disp: , Rfl:    latanoprost (XALATAN) 0.005 % ophthalmic solution, Place 2 drops into both eyes at bedtime. , Disp: , Rfl:    linaclotide (LINZESS) 72 MCG capsule, Take 1 capsule (72 mcg total) by mouth daily before breakfast., Disp: 30 capsule, Rfl: 1   [START ON 01/12/2023] morphine (MS CONTIN) 30 MG 12 hr tablet, Take 1 tablet (30 mg total) by mouth every 8 (eight) hours., Disp: 90 tablet, Rfl: 0   naloxone (NARCAN) nasal spray 4 mg/0.1 mL, SMARTSIG:Both Nares, Disp: , Rfl:    ondansetron (ZOFRAN) 4 MG tablet, Take 1 tablet by mouth every 6 (six) hours as needed., Disp: , Rfl:    PARoxetine (PAXIL) 30 MG tablet, Take 1 tablet by  mouth daily., Disp: , Rfl:    propranolol (INDERAL) 20 MG tablet, Take 60 mg by mouth 2 (two) times daily. , Disp: , Rfl:    ranitidine (ZANTAC) 150 MG tablet, Take 150 mg by mouth 2 (two) times daily., Disp: , Rfl:    scopolamine (TRANSDERM-SCOP) 1 MG/3DAYS, 1 patch every 3 (three) days., Disp: , Rfl:    SUMAtriptan (IMITREX) 100 MG tablet, Take 100 mg by mouth once. May repeat in 2 hours if headache persists or recurs., Disp: , Rfl:    tamsulosin (FLOMAX) 0.4 MG CAPS capsule, Take 1 capsule (0.4 mg total) by mouth daily., Disp: 30  capsule, Rfl: 11   topiramate (TOPAMAX) 50 MG tablet, Take 50 mg by mouth 2 (two) times daily., Disp: , Rfl:    traZODone (DESYREL) 50 MG tablet, Take 100 mg by mouth at bedtime as needed for sleep. Take 1-2 tablets at bedtime as needed, Disp: , Rfl:    Past Medical History:  Diagnosis Date   Anxiety    Cancer ([facility])    skin cancer   Chronic low back pain 05/09/2021   Chronic pain syndrome    Chronic, continuous use of opioids 05/09/2021   DDD (degenerative disc disease), cervical    Depression    Depression    Glaucoma    Heartburn    Hypertension    Kidney stone    Kidney stones    Knee pain, bilateral 05/09/2021   Migraine    Migraine    Post-traumatic headache    Sleep apnea    Traumatic arthritis      Assessment and Plan: 1. Chronic bilateral low back pain without sciatica   2. DDD (degenerative disc disease), lumbar   3. Facet syndrome, lumbar   4. Primary osteoarthritis of both knees   5. Chronic pain syndrome   6. Chronic, continuous use of opioids   7. Pain in both hands   Based on our discussion today it is appropriate to refill his medicines for the next 2 months.  These refills mostly for May 13 and June 12.  I have reviewed the [facility] practitioner database information and it is appropriate for refill.  Continue follow-up with his primary care physicians for baseline medical care with return to clinic in 2 months.  Follow Up Instructions:    I discussed the assessment and treatment plan with the patient. The patient was provided an opportunity to ask questions and all were answered. The patient agreed with the plan and demonstrated an understanding of the instructions.   The patient was advised to call back or seek an in-person evaluation if the symptoms worsen or if the condition fails to improve as anticipated.  I provided 25 minutes of non-face-to-face time during this encounter.   Yevette Edwards, MD

## 2023-01-20 DIAGNOSIS — M1712 Unilateral primary osteoarthritis, left knee: Secondary | ICD-10-CM | POA: Diagnosis not present

## 2023-01-27 DIAGNOSIS — Z21 Asymptomatic human immunodeficiency virus [HIV] infection status: Secondary | ICD-10-CM | POA: Diagnosis not present

## 2023-01-27 DIAGNOSIS — M1712 Unilateral primary osteoarthritis, left knee: Secondary | ICD-10-CM | POA: Diagnosis not present

## 2023-02-03 DIAGNOSIS — Z21 Asymptomatic human immunodeficiency virus [HIV] infection status: Secondary | ICD-10-CM | POA: Diagnosis not present

## 2023-02-03 DIAGNOSIS — M1712 Unilateral primary osteoarthritis, left knee: Secondary | ICD-10-CM | POA: Diagnosis not present

## 2023-02-13 DIAGNOSIS — I10 Essential (primary) hypertension: Secondary | ICD-10-CM | POA: Diagnosis not present

## 2023-02-26 ENCOUNTER — Telehealth: Payer: Self-pay | Admitting: Anesthesiology

## 2023-02-26 DIAGNOSIS — G43909 Migraine, unspecified, not intractable, without status migrainosus: Secondary | ICD-10-CM | POA: Diagnosis not present

## 2023-02-26 DIAGNOSIS — Z21 Asymptomatic human immunodeficiency virus [HIV] infection status: Secondary | ICD-10-CM | POA: Diagnosis not present

## 2023-02-26 DIAGNOSIS — Z1331 Encounter for screening for depression: Secondary | ICD-10-CM | POA: Diagnosis not present

## 2023-02-26 DIAGNOSIS — Z0000 Encounter for general adult medical examination without abnormal findings: Secondary | ICD-10-CM | POA: Diagnosis not present

## 2023-02-26 DIAGNOSIS — Z1211 Encounter for screening for malignant neoplasm of colon: Secondary | ICD-10-CM | POA: Diagnosis not present

## 2023-02-26 DIAGNOSIS — F325 Major depressive disorder, single episode, in full remission: Secondary | ICD-10-CM | POA: Diagnosis not present

## 2023-02-26 DIAGNOSIS — I10 Essential (primary) hypertension: Secondary | ICD-10-CM | POA: Diagnosis not present

## 2023-02-26 DIAGNOSIS — G4730 Sleep apnea, unspecified: Secondary | ICD-10-CM | POA: Diagnosis not present

## 2023-02-26 NOTE — Telephone Encounter (Signed)
error 

## 2023-03-12 ENCOUNTER — Encounter: Payer: Self-pay | Admitting: Anesthesiology

## 2023-03-12 ENCOUNTER — Ambulatory Visit: Payer: Medicare HMO | Attending: Anesthesiology | Admitting: Anesthesiology

## 2023-03-12 DIAGNOSIS — M545 Low back pain, unspecified: Secondary | ICD-10-CM | POA: Diagnosis not present

## 2023-03-12 DIAGNOSIS — M170 Bilateral primary osteoarthritis of knee: Secondary | ICD-10-CM | POA: Diagnosis not present

## 2023-03-12 DIAGNOSIS — M47816 Spondylosis without myelopathy or radiculopathy, lumbar region: Secondary | ICD-10-CM

## 2023-03-12 DIAGNOSIS — M5136 Other intervertebral disc degeneration, lumbar region: Secondary | ICD-10-CM | POA: Diagnosis not present

## 2023-03-12 DIAGNOSIS — G894 Chronic pain syndrome: Secondary | ICD-10-CM | POA: Diagnosis not present

## 2023-03-12 DIAGNOSIS — F1190 Opioid use, unspecified, uncomplicated: Secondary | ICD-10-CM

## 2023-03-12 DIAGNOSIS — G8929 Other chronic pain: Secondary | ICD-10-CM

## 2023-03-12 MED ORDER — GABAPENTIN 100 MG PO CAPS: 100.0000 mg | ORAL_CAPSULE | Freq: Three times a day (TID) | ORAL | 3 refills | Status: DC

## 2023-03-12 MED ORDER — MORPHINE SULFATE ER 30 MG PO TBCR: 30.0000 mg | EXTENDED_RELEASE_TABLET | Freq: Three times a day (TID) | ORAL | 0 refills | Status: AC

## 2023-03-12 NOTE — Progress Notes (Signed)
Virtual Visit via Telephone Note  I connected with Tanner Baker on 03/12/23 at 10:00 AM EDT by telephone and verified that I am speaking with the correct person using two identifiers.  Location: Patient: Home Provider: [facility] control center   I discussed the limitations, risks, security and privacy concerns of performing an evaluation and management service by telephone and the availability of in person appointments. I also discussed with the patient that there may be a patient responsible charge related to this service. The patient expressed understanding and agreed to proceed.   History of Present Illness: I spoke with Tanner Baker via telephone as we were unable lengthen the video portion the conference.  He reports that he is doing very well.  He still taking his medication with the morphine sulfate extended release 3 times a day.  This is working much better than the hydrocodone he had been on previously.  He gets better relief and no side effects are reported.  He is able to be more active functional and sleep better at night.  He is continuing to do his stretching exercises as tolerated staying ambulatory and feels good.  No change in the quality characteristic or distribution of his low back pain hand pain or leg pain is noted.  Review of systems: General: No fevers or chills Pulmonary: No shortness of breath or dyspnea Cardiac: No angina or palpitations or lightheadedness GI: No abdominal pain or constipation Psych: No depression    Observations/Objective:  Current Outpatient Medications:    [START ON 04/12/2023] morphine (MS CONTIN) 30 MG 12 hr tablet, Take 1 tablet (30 mg total) by mouth every 8 (eight) hours., Disp: 90 tablet, Rfl: 0   Ascorbic Acid (VITAMIN C) 100 MG tablet, Take 100 mg by mouth daily. (Patient not taking: Reported on 06/18/2022), Disp: , Rfl:    bisacodyl (DULCOLAX) 5 MG EC tablet, Take 2 tablets (10 mg total) by mouth daily as needed for moderate  constipation., Disp: 30 tablet, Rfl: 0   Cholecalciferol (VITAMIN D-3 PO), Take 1,000 Int'l Units/day by mouth every morning. Reported on 01/01/2016 (Patient not taking: Reported on 06/18/2022), Disp: , Rfl:    diclofenac Sodium (VOLTAREN) 1 % GEL, Apply topically., Disp: , Rfl:    famotidine (PEPCID) 20 MG tablet, Take 20 mg by mouth 2 (two) times daily., Disp: , Rfl:    fluticasone (FLONASE) 50 MCG/ACT nasal spray, Place 1 spray into both nostrils daily as needed. Reported on 03/21/2016, Disp: , Rfl:    gabapentin (NEURONTIN) 100 MG capsule, Take 1 capsule (100 mg total) by mouth 3 (three) times daily., Disp: 90 capsule, Rfl: 3   hydrochlorothiazide (HYDRODIURIL) 25 MG tablet, Take 25 mg by mouth daily., Disp: , Rfl:    latanoprost (XALATAN) 0.005 % ophthalmic solution, Place 2 drops into both eyes at bedtime. , Disp: , Rfl:    linaclotide (LINZESS) 72 MCG capsule, Take 1 capsule (72 mcg total) by mouth daily before breakfast., Disp: 30 capsule, Rfl: 1   morphine (MS CONTIN) 30 MG 12 hr tablet, Take 1 tablet (30 mg total) by mouth every 8 (eight) hours., Disp: 90 tablet, Rfl: 0   naloxone (NARCAN) nasal spray 4 mg/0.1 mL, SMARTSIG:Both Nares, Disp: , Rfl:    ondansetron (ZOFRAN) 4 MG tablet, Take 1 tablet by mouth every 6 (six) hours as needed., Disp: , Rfl:    PARoxetine (PAXIL) 30 MG tablet, Take 1 tablet by mouth daily., Disp: , Rfl:    propranolol (INDERAL) 20 MG tablet, Take  60 mg by mouth 2 (two) times daily. , Disp: , Rfl:    ranitidine (ZANTAC) 150 MG tablet, Take 150 mg by mouth 2 (two) times daily., Disp: , Rfl:    scopolamine (TRANSDERM-SCOP) 1 MG/3DAYS, 1 patch every 3 (three) days., Disp: , Rfl:    SUMAtriptan (IMITREX) 100 MG tablet, Take 100 mg by mouth once. May repeat in 2 hours if headache persists or recurs., Disp: , Rfl:    tamsulosin (FLOMAX) 0.4 MG CAPS capsule, Take 1 capsule (0.4 mg total) by mouth daily., Disp: 30 capsule, Rfl: 11   topiramate (TOPAMAX) 50 MG tablet, Take 50  mg by mouth 2 (two) times daily., Disp: , Rfl:    traZODone (DESYREL) 50 MG tablet, Take 100 mg by mouth at bedtime as needed for sleep. Take 1-2 tablets at bedtime as needed, Disp: , Rfl:    Past Medical History:  Diagnosis Date   Anxiety    Cancer (HCC)    skin cancer   Chronic low back pain 05/09/2021   Chronic pain syndrome    Chronic, continuous use of opioids 05/09/2021   DDD (degenerative disc disease), cervical    Depression    Depression    Glaucoma    Heartburn    Hypertension    Kidney stone    Kidney stones    Knee pain, bilateral 05/09/2021   Migraine    Migraine    Post-traumatic headache    Sleep apnea    Traumatic arthritis      Assessment and Plan:  1. Chronic bilateral low back pain without sciatica   2. DDD (degenerative disc disease), lumbar   3. Facet syndrome, lumbar   4. Primary osteoarthritis of both knees   5. Chronic pain syndrome   6. Chronic, continuous use of opioids   7. Chronic pain of both knees   Based on our conversation it is appropriate to refill his medicines for the next 2 months.  This to be dated for 711 and August 11.  He is due July 12 but for transportation purposes will allow him an early refill by 1 day.  Continue current medication regimen without other changes initiated.  Continue stretching strengthening and core exercises.  Will schedule him for an in person visit at his next appointment in 2 months.  Continue follow-up with his primary care physician for baseline medical care.  I have reviewed the [facility] practitioner database information and it is appropriate. Follow Up Instructions:    I discussed the assessment and treatment plan with the patient. The patient was provided an opportunity to ask questions and all were answered. The patient agreed with the plan and demonstrated an understanding of the instructions.   The patient was advised to call back or seek an in-person evaluation if the symptoms worsen or if the  condition fails to improve as anticipated.  I provided 30 minutes of non-face-to-face time during this encounter.   Yevette Edwards, MD

## 2023-04-16 ENCOUNTER — Other Ambulatory Visit: Payer: Self-pay | Admitting: Anesthesiology

## 2023-05-13 ENCOUNTER — Encounter: Payer: Self-pay | Admitting: Anesthesiology

## 2023-05-13 ENCOUNTER — Ambulatory Visit: Payer: Medicare HMO | Attending: Anesthesiology | Admitting: Anesthesiology

## 2023-05-13 VITALS — BP 146/62 | HR 57 | Temp 97.1°F | Resp 16 | Ht 69.0 in | Wt 164.0 lb

## 2023-05-13 DIAGNOSIS — M25562 Pain in left knee: Secondary | ICD-10-CM | POA: Diagnosis not present

## 2023-05-13 DIAGNOSIS — M170 Bilateral primary osteoarthritis of knee: Secondary | ICD-10-CM | POA: Insufficient documentation

## 2023-05-13 DIAGNOSIS — G894 Chronic pain syndrome: Secondary | ICD-10-CM | POA: Diagnosis not present

## 2023-05-13 DIAGNOSIS — G8929 Other chronic pain: Secondary | ICD-10-CM | POA: Diagnosis not present

## 2023-05-13 DIAGNOSIS — M47816 Spondylosis without myelopathy or radiculopathy, lumbar region: Secondary | ICD-10-CM | POA: Insufficient documentation

## 2023-05-13 DIAGNOSIS — M79642 Pain in left hand: Secondary | ICD-10-CM | POA: Insufficient documentation

## 2023-05-13 DIAGNOSIS — M5136 Other intervertebral disc degeneration, lumbar region: Secondary | ICD-10-CM | POA: Insufficient documentation

## 2023-05-13 DIAGNOSIS — M79641 Pain in right hand: Secondary | ICD-10-CM | POA: Diagnosis not present

## 2023-05-13 DIAGNOSIS — M25561 Pain in right knee: Secondary | ICD-10-CM | POA: Insufficient documentation

## 2023-05-13 DIAGNOSIS — M545 Low back pain, unspecified: Secondary | ICD-10-CM | POA: Diagnosis not present

## 2023-05-13 MED ORDER — MORPHINE SULFATE ER 30 MG PO TBCR
30.0000 mg | EXTENDED_RELEASE_TABLET | Freq: Three times a day (TID) | ORAL | 0 refills | Status: AC
Start: 2023-06-13 — End: 2023-07-13

## 2023-05-13 MED ORDER — GABAPENTIN 100 MG PO CAPS: 100.0000 mg | ORAL_CAPSULE | Freq: Three times a day (TID) | ORAL | 3 refills | Status: AC

## 2023-05-13 MED ORDER — MORPHINE SULFATE ER 30 MG PO TBCR: 30.0000 mg | EXTENDED_RELEASE_TABLET | Freq: Three times a day (TID) | ORAL | 0 refills | Status: AC

## 2023-05-13 NOTE — Progress Notes (Unsigned)
Nursing Pain Medication Assessment:  Safety precautions to be maintained throughout the outpatient stay will include: orient to surroundings, keep bed in low position, maintain call bell within reach at all times, provide assistance with transfer out of bed and ambulation.  Medication Inspection Compliance: Pill count conducted under aseptic conditions, in front of the patient. Neither the pills nor the bottle was removed from the patient's sight at any time. Once count was completed pills were immediately returned to the patient in their original bottle.  Medication: Morphine ER (MSContin) Pill/Patch Count:  31 of 90 pills remain Pill/Patch Appearance: Markings consistent with prescribed medication Bottle Appearance: Standard pharmacy container. Clearly labeled. Filled Date: 07 / 11 / 2024 Last Medication intake:  Today

## 2023-05-14 NOTE — Progress Notes (Signed)
Subjective:  Patient ID: Tanner Baker, male    DOB: October 10, 1950  Age: 72 y.o. MRN: [id number]  CC: Back Pain (low)   Procedure: None  HPI Tanner Baker presents for reevaluation.  Tanner Baker continues to have chronic low back pain and some diffuse body pain with osteoarthritis.  He continues to respond favorably with the chronic opioid therapy.  He has been on this for a extended period of time and has done well.  He generally gets about 75 to 80% relief with his medication lasting about 4 to 6 hours before he gets recurrence of the same baseline pain.  He has failed more conservative therapy but has responded favorably to his current medication management.  He continues to try and stay active doing stretching strengthening exercises and physical therapy exercises as tolerated.  No change in lower extremity strength bowel bladder function is noted.  Outpatient Medications Prior to Visit  Medication Sig Dispense Refill   bisacodyl (DULCOLAX) 5 MG EC tablet Take 2 tablets (10 mg total) by mouth daily as needed for moderate constipation. 30 tablet 0   diclofenac Sodium (VOLTAREN) 1 % GEL Apply topically.     famotidine (PEPCID) 20 MG tablet Take 20 mg by mouth 2 (two) times daily.     fluticasone (FLONASE) 50 MCG/ACT nasal spray Place 1 spray into both nostrils daily as needed. Reported on 03/21/2016     hydrochlorothiazide (HYDRODIURIL) 25 MG tablet Take 25 mg by mouth daily.     latanoprost (XALATAN) 0.005 % ophthalmic solution Place 2 drops into both eyes at bedtime.      naloxone (NARCAN) nasal spray 4 mg/0.1 mL SMARTSIG:Both Nares     ondansetron (ZOFRAN) 4 MG tablet Take 1 tablet by mouth every 6 (six) hours as needed.     pantoprazole (PROTONIX) 40 MG tablet Take by mouth.     PARoxetine (PAXIL) 30 MG tablet Take 1 tablet by mouth daily.     propranolol (INDERAL) 20 MG tablet Take 60 mg by mouth 2 (two) times daily.      scopolamine (TRANSDERM-SCOP) 1 MG/3DAYS 1 patch every 3 (three) days.      SUMAtriptan (IMITREX) 100 MG tablet Take 100 mg by mouth once. May repeat in 2 hours if headache persists or recurs.     tamsulosin (FLOMAX) 0.4 MG CAPS capsule Take 1 capsule (0.4 mg total) by mouth daily. 30 capsule 11   topiramate (TOPAMAX) 50 MG tablet Take 50 mg by mouth 2 (two) times daily.     traZODone (DESYREL) 50 MG tablet Take 100 mg by mouth at bedtime as needed for sleep. Take 1-2 tablets at bedtime as needed     gabapentin (NEURONTIN) 100 MG capsule Take 1 capsule (100 mg total) by mouth 3 (three) times daily. 90 capsule 3   morphine (MS CONTIN) 30 MG 12 hr tablet Take 30 mg by mouth 3 (three) times daily.     Ascorbic Acid (VITAMIN C) 100 MG tablet Take 100 mg by mouth daily. (Patient not taking: Reported on 06/18/2022)     Cholecalciferol (VITAMIN D-3 PO) Take 1,000 Int'l Units/day by mouth every morning. Reported on 01/01/2016 (Patient not taking: Reported on 06/18/2022)     linaclotide (LINZESS) 72 MCG capsule Take 1 capsule (72 mcg total) by mouth daily before breakfast. 30 capsule 1   ranitidine (ZANTAC) 150 MG tablet Take 150 mg by mouth 2 (two) times daily. (Patient not taking: Reported on 05/13/2023)     No facility-administered medications prior to  visit.    Review of Systems CNS: No confusion or sedation Cardiac: No angina or palpitations GI: No abdominal pain or constipation Constitutional: No nausea vomiting fevers or chills  Objective:  BP (!) 146/62   Pulse (!) 57   Temp (!) 97.1 F (36.2 C)   Resp 16   Ht 5\' 9"  (1.753 m)   Wt 164 lb (74.4 kg)   SpO2 100%   BMI 24.22 kg/m    BP Readings from Last 3 Encounters:  05/13/23 (!) 146/62  06/18/22 139/72  05/08/22 (!) 154/80     Wt Readings from Last 3 Encounters:  05/13/23 164 lb (74.4 kg)  06/18/22 160 lb (72.6 kg)  05/08/22 160 lb (72.6 kg)     Physical Exam Pt is alert and oriented PERRL EOMI HEART IS RRR no murmur or rub LCTA no wheezing or rales MUSCULOSKELETAL reveals some paraspinous  muscle tenderness in the lumbar region but no overt trigger points are noted.  Good strength in the upper and lower extremities is noted.  Good muscle tone and bulk noted.  Labs  No results found for: "HGBA1C" Lab Results  Component Value Date   CREATININE 1.18 11/29/2020    -------------------------------------------------------------------------------------------------------------------- Lab Results  Component Value Date   WBC 12.4 (H) 11/29/2020   HGB 7.9 (L) 11/29/2020   HCT 23.7 (L) 11/29/2020   PLT 117 (L) 11/29/2020   GLUCOSE 103 (H) 11/29/2020   ALT 15 11/26/2020   AST 21 11/26/2020   NA 140 11/29/2020   K 3.8 11/29/2020   CL 112 (H) 11/29/2020   CREATININE 1.18 11/29/2020   BUN 22 11/29/2020   CO2 24 11/29/2020   INR 1.1 11/26/2020    --------------------------------------------------------------------------------------------------------------------- Abdomen 1 view (KUB)  Result Date: 05/10/2022 CLINICAL DATA:  Follow-up kidney stone from 1 year ago. Asymptomatic. EXAM: ABDOMEN - 1 VIEW COMPARISON:  05/02/2021 FINDINGS: Bowel gas pattern is nonobstructive with mild fecal retention throughout the colon. No definite stones visualized over the kidneys. Small phlebolith over the right pelvis. Degenerative change of the spine. Fixation hardware over the right hip unchanged. IMPRESSION: Nonobstructive bowel gas pattern.  No definite renal stones. Electronically Signed   By: Elberta Fortis M.D.   On: 05/10/2022 13:12     Assessment & Plan:   Vibhu was seen today for back pain.  Diagnoses and all orders for this visit:  Chronic bilateral low back pain without sciatica  DDD (degenerative disc disease), lumbar  Primary osteoarthritis of both knees  Chronic pain syndrome -     ToxASSURE Select 13 (MW), Urine  Facet syndrome, lumbar  Pain in both hands  Chronic pain of both knees  Other orders -     morphine (MS CONTIN) 30 MG 12 hr tablet; Take 1 tablet (30 mg  total) by mouth 3 (three) times daily. -     morphine (MS CONTIN) 30 MG 12 hr tablet; Take 1 tablet (30 mg total) by mouth every 8 (eight) hours. -     gabapentin (NEURONTIN) 100 MG capsule; Take 1 capsule (100 mg total) by mouth 3 (three) times daily.        ----------------------------------------------------------------------------------------------------------------------  Problem List Items Addressed This Visit       Unprioritized   Hand pain (Chronic)   Chronic low back pain - Primary   Relevant Medications   morphine (MS CONTIN) 30 MG 12 hr tablet   morphine (MS CONTIN) 30 MG 12 hr tablet (Start on 06/13/2023)   gabapentin (NEURONTIN) 100 MG  capsule   Chronic pain syndrome   Relevant Medications   morphine (MS CONTIN) 30 MG 12 hr tablet   morphine (MS CONTIN) 30 MG 12 hr tablet (Start on 06/13/2023)   gabapentin (NEURONTIN) 100 MG capsule   Other Relevant Orders   ToxASSURE Select 13 (MW), Urine   DDD (degenerative disc disease), lumbar   Relevant Medications   morphine (MS CONTIN) 30 MG 12 hr tablet   morphine (MS CONTIN) 30 MG 12 hr tablet (Start on 06/13/2023)   DJD (degenerative joint disease) of knee   Relevant Medications   morphine (MS CONTIN) 30 MG 12 hr tablet   morphine (MS CONTIN) 30 MG 12 hr tablet (Start on 06/13/2023)   Facet syndrome, lumbar   Relevant Medications   morphine (MS CONTIN) 30 MG 12 hr tablet   morphine (MS CONTIN) 30 MG 12 hr tablet (Start on 06/13/2023)   Knee pain, bilateral      ----------------------------------------------------------------------------------------------------------------------  1. Chronic bilateral low back pain without sciatica Continue with core stretching strengthening exercises as tolerated.  Will defer on any repeat injections today.  Will continue current medication management as prescribed as this is working well for him and he is able to to continue to stay active and function with his medication  management.  2. DDD (degenerative disc disease), lumbar As above  3. Primary osteoarthritis of both knees   4. Chronic pain syndrome I have reviewed the [facility] practitioner database information and its appropriate for refills.  These were generated for September 12 and October 12. - ToxASSURE Select 13 (MW), Urine  5. Facet syndrome, lumbar Continue core stretching strengthening  6. Pain in both hands   7. Chronic pain of both knees Plan continue follow-up with his primary care physician for baseline medical care.    ----------------------------------------------------------------------------------------------------------------------  I have changed Taray Brensinger's morphine. I am also having him start on morphine. Additionally, I am having him maintain his propranolol, traZODone, Cholecalciferol (VITAMIN D-3 PO), SUMAtriptan, ranitidine, hydrochlorothiazide, latanoprost, fluticasone, vitamin C, diclofenac Sodium, topiramate, Dulcolax, famotidine, naloxone, ondansetron, scopolamine, PARoxetine, tamsulosin, linaclotide, pantoprazole, and gabapentin.   Meds ordered this encounter  Medications   morphine (MS CONTIN) 30 MG 12 hr tablet    Sig: Take 1 tablet (30 mg total) by mouth 3 (three) times daily.    Dispense:  90 tablet    Refill:  0   morphine (MS CONTIN) 30 MG 12 hr tablet    Sig: Take 1 tablet (30 mg total) by mouth every 8 (eight) hours.    Dispense:  90 tablet    Refill:  0   gabapentin (NEURONTIN) 100 MG capsule    Sig: Take 1 capsule (100 mg total) by mouth 3 (three) times daily.    Dispense:  90 capsule    Refill:  3   Patient's Medications  New Prescriptions   MORPHINE (MS CONTIN) 30 MG 12 HR TABLET    Take 1 tablet (30 mg total) by mouth every 8 (eight) hours.  Previous Medications   ASCORBIC ACID (VITAMIN C) 100 MG TABLET    Take 100 mg by mouth daily.   BISACODYL (DULCOLAX) 5 MG EC TABLET    Take 2 tablets (10 mg total) by mouth daily as needed  for moderate constipation.   CHOLECALCIFEROL (VITAMIN D-3 PO)    Take 1,000 Int'l Units/day by mouth every morning. Reported on 01/01/2016   DICLOFENAC SODIUM (VOLTAREN) 1 % GEL    Apply topically.   FAMOTIDINE (PEPCID) 20  MG TABLET    Take 20 mg by mouth 2 (two) times daily.   FLUTICASONE (FLONASE) 50 MCG/ACT NASAL SPRAY    Place 1 spray into both nostrils daily as needed. Reported on 03/21/2016   HYDROCHLOROTHIAZIDE (HYDRODIURIL) 25 MG TABLET    Take 25 mg by mouth daily.   LATANOPROST (XALATAN) 0.005 % OPHTHALMIC SOLUTION    Place 2 drops into both eyes at bedtime.    LINACLOTIDE (LINZESS) 72 MCG CAPSULE    Take 1 capsule (72 mcg total) by mouth daily before breakfast.   NALOXONE (NARCAN) NASAL SPRAY 4 MG/0.1 ML    SMARTSIG:Both Nares   ONDANSETRON (ZOFRAN) 4 MG TABLET    Take 1 tablet by mouth every 6 (six) hours as needed.   PANTOPRAZOLE (PROTONIX) 40 MG TABLET    Take by mouth.   PAROXETINE (PAXIL) 30 MG TABLET    Take 1 tablet by mouth daily.   PROPRANOLOL (INDERAL) 20 MG TABLET    Take 60 mg by mouth 2 (two) times daily.    RANITIDINE (ZANTAC) 150 MG TABLET    Take 150 mg by mouth 2 (two) times daily.   SCOPOLAMINE (TRANSDERM-SCOP) 1 MG/3DAYS    1 patch every 3 (three) days.   SUMATRIPTAN (IMITREX) 100 MG TABLET    Take 100 mg by mouth once. May repeat in 2 hours if headache persists or recurs.   TAMSULOSIN (FLOMAX) 0.4 MG CAPS CAPSULE    Take 1 capsule (0.4 mg total) by mouth daily.   TOPIRAMATE (TOPAMAX) 50 MG TABLET    Take 50 mg by mouth 2 (two) times daily.   TRAZODONE (DESYREL) 50 MG TABLET    Take 100 mg by mouth at bedtime as needed for sleep. Take 1-2 tablets at bedtime as needed  Modified Medications   Modified Medication Previous Medication   GABAPENTIN (NEURONTIN) 100 MG CAPSULE gabapentin (NEURONTIN) 100 MG capsule      Take 1 capsule (100 mg total) by mouth 3 (three) times daily.    Take 1 capsule (100 mg total) by mouth 3 (three) times daily.   MORPHINE (MS CONTIN) 30 MG 12  HR TABLET morphine (MS CONTIN) 30 MG 12 hr tablet      Take 1 tablet (30 mg total) by mouth 3 (three) times daily.    Take 30 mg by mouth 3 (three) times daily.  Discontinued Medications   No medications on file   ----------------------------------------------------------------------------------------------------------------------  Follow-up: Return in about 2 months (around 07/13/2023) for , virtual, MM eval.    Yevette Edwards, MD

## 2023-05-19 LAB — TOXASSURE SELECT 13 (MW), URINE

## 2023-06-01 DIAGNOSIS — Z86010 Personal history of colonic polyps: Secondary | ICD-10-CM | POA: Diagnosis not present

## 2023-06-01 DIAGNOSIS — Z1211 Encounter for screening for malignant neoplasm of colon: Secondary | ICD-10-CM | POA: Diagnosis not present

## 2023-07-13 ENCOUNTER — Ambulatory Visit: Payer: Medicare HMO | Attending: Anesthesiology | Admitting: Anesthesiology

## 2023-07-13 DIAGNOSIS — M545 Low back pain, unspecified: Secondary | ICD-10-CM

## 2023-07-13 DIAGNOSIS — G894 Chronic pain syndrome: Secondary | ICD-10-CM

## 2023-07-13 DIAGNOSIS — M79641 Pain in right hand: Secondary | ICD-10-CM

## 2023-07-13 DIAGNOSIS — M47816 Spondylosis without myelopathy or radiculopathy, lumbar region: Secondary | ICD-10-CM

## 2023-07-13 DIAGNOSIS — G8929 Other chronic pain: Secondary | ICD-10-CM

## 2023-07-13 DIAGNOSIS — M170 Bilateral primary osteoarthritis of knee: Secondary | ICD-10-CM

## 2023-07-13 DIAGNOSIS — M1990 Unspecified osteoarthritis, unspecified site: Secondary | ICD-10-CM

## 2023-07-13 DIAGNOSIS — F1190 Opioid use, unspecified, uncomplicated: Secondary | ICD-10-CM

## 2023-07-14 ENCOUNTER — Encounter: Payer: Self-pay | Admitting: Anesthesiology

## 2023-07-14 ENCOUNTER — Ambulatory Visit: Payer: Medicare HMO | Attending: Anesthesiology | Admitting: Anesthesiology

## 2023-07-14 DIAGNOSIS — G894 Chronic pain syndrome: Secondary | ICD-10-CM

## 2023-07-14 DIAGNOSIS — M170 Bilateral primary osteoarthritis of knee: Secondary | ICD-10-CM

## 2023-07-14 DIAGNOSIS — Z79891 Long term (current) use of opiate analgesic: Secondary | ICD-10-CM

## 2023-07-14 DIAGNOSIS — M5136 Other intervertebral disc degeneration, lumbar region with discogenic back pain only: Secondary | ICD-10-CM

## 2023-07-14 DIAGNOSIS — K5903 Drug induced constipation: Secondary | ICD-10-CM

## 2023-07-14 DIAGNOSIS — M47816 Spondylosis without myelopathy or radiculopathy, lumbar region: Secondary | ICD-10-CM | POA: Diagnosis not present

## 2023-07-14 DIAGNOSIS — G8929 Other chronic pain: Secondary | ICD-10-CM

## 2023-07-14 DIAGNOSIS — M545 Low back pain, unspecified: Secondary | ICD-10-CM

## 2023-07-14 DIAGNOSIS — T402X5A Adverse effect of other opioids, initial encounter: Secondary | ICD-10-CM

## 2023-07-14 DIAGNOSIS — M1990 Unspecified osteoarthritis, unspecified site: Secondary | ICD-10-CM

## 2023-07-14 DIAGNOSIS — M79641 Pain in right hand: Secondary | ICD-10-CM

## 2023-07-14 DIAGNOSIS — M79642 Pain in left hand: Secondary | ICD-10-CM | POA: Diagnosis not present

## 2023-07-14 DIAGNOSIS — F1190 Opioid use, unspecified, uncomplicated: Secondary | ICD-10-CM

## 2023-07-14 MED ORDER — MORPHINE SULFATE ER 30 MG PO TBCR: 30.0000 mg | EXTENDED_RELEASE_TABLET | Freq: Three times a day (TID) | ORAL | 0 refills | Status: DC

## 2023-07-14 MED ORDER — NALOXEGOL OXALATE 12.5 MG PO TABS
12.5000 mg | ORAL_TABLET | Freq: Two times a day (BID) | ORAL | Status: DC
  Filled 2023-07-14: qty 1

## 2023-07-14 NOTE — Progress Notes (Signed)
Virtual Visit via Telephone Note  I connected with Tanner Baker on 07/14/23 at  2:45 PM EST by telephone and verified that I am speaking with the correct person using two identifiers.  Location: Patient: Home Provider: Pain control center   I discussed the limitations, risks, security and privacy concerns of performing an evaluation and management service by telephone and the availability of in person appointments. I also discussed with the patient that there may be a patient responsible charge related to this service. The patient expressed understanding and agreed to proceed.   History of Present Illness: I spoke with Tanner Baker via telephone as we were unable link for the video portion of the conference.  He reports that he has and lower extremity pain is at baseline.  No recent changes have been noted and he still responding favorably to the MS Contin 30 mg tablets which she takes 3 times a day.  He has done better on this than the Duragesic and Vicodin combination.  He has been able to be more active and unfortunately he has failed more conservative therapy but this is working well for him.  It enables him to fulfill daily household activities chores and sleep better at night.  He is also to do his physical therapy exercises.  He does describe some problems with opioid-induced constipation.  He has taken stool softeners and fiber in the past without much success.  This become more of a problem for him and he would like to try something stronger to help with the constipation.  Otherwise he is in his usual state of health with no new changes in lower EXTR strength function bowel or bladder function.  Review of systems: General: No fevers or chills Pulmonary: No shortness of breath or dyspnea Cardiac: No angina or palpitations or lightheadedness GI: No abdominal pain or constipation Psych: No depression    Observations/Objective:  Current Outpatient Medications:    morphine (MS CONTIN)  30 MG 12 hr tablet, Take 1 tablet (30 mg total) by mouth 3 (three) times daily., Disp: 90 tablet, Rfl: 0   [START ON 08/13/2023] morphine (MS CONTIN) 30 MG 12 hr tablet, Take 1 tablet (30 mg total) by mouth 3 (three) times daily., Disp: 90 tablet, Rfl: 0   Ascorbic Acid (VITAMIN C) 100 MG tablet, Take 100 mg by mouth daily. (Patient not taking: Reported on 06/18/2022), Disp: , Rfl:    bisacodyl (DULCOLAX) 5 MG EC tablet, Take 2 tablets (10 mg total) by mouth daily as needed for moderate constipation., Disp: 30 tablet, Rfl: 0   Cholecalciferol (VITAMIN D-3 PO), Take 1,000 Int'l Units/day by mouth every morning. Reported on 01/01/2016 (Patient not taking: Reported on 06/18/2022), Disp: , Rfl:    diclofenac Sodium (VOLTAREN) 1 % GEL, Apply topically., Disp: , Rfl:    famotidine (PEPCID) 20 MG tablet, Take 20 mg by mouth 2 (two) times daily., Disp: , Rfl:    fluticasone (FLONASE) 50 MCG/ACT nasal spray, Place 1 spray into both nostrils daily as needed. Reported on 03/21/2016, Disp: , Rfl:    gabapentin (NEURONTIN) 100 MG capsule, Take 1 capsule (100 mg total) by mouth 3 (three) times daily., Disp: 90 capsule, Rfl: 3   hydrochlorothiazide (HYDRODIURIL) 25 MG tablet, Take 25 mg by mouth daily., Disp: , Rfl:    latanoprost (XALATAN) 0.005 % ophthalmic solution, Place 2 drops into both eyes at bedtime. , Disp: , Rfl:    linaclotide (LINZESS) 72 MCG capsule, Take 1 capsule (72 mcg total) by  mouth daily before breakfast., Disp: 30 capsule, Rfl: 1   naloxone (NARCAN) nasal spray 4 mg/0.1 mL, SMARTSIG:Both Nares, Disp: , Rfl:    ondansetron (ZOFRAN) 4 MG tablet, Take 1 tablet by mouth every 6 (six) hours as needed., Disp: , Rfl:    pantoprazole (PROTONIX) 40 MG tablet, Take by mouth., Disp: , Rfl:    PARoxetine (PAXIL) 30 MG tablet, Take 1 tablet by mouth daily., Disp: , Rfl:    propranolol (INDERAL) 20 MG tablet, Take 60 mg by mouth 2 (two) times daily. , Disp: , Rfl:    ranitidine (ZANTAC) 150 MG tablet, Take  150 mg by mouth 2 (two) times daily. (Patient not taking: Reported on 05/13/2023), Disp: , Rfl:    scopolamine (TRANSDERM-SCOP) 1 MG/3DAYS, 1 patch every 3 (three) days., Disp: , Rfl:    SUMAtriptan (IMITREX) 100 MG tablet, Take 100 mg by mouth once. May repeat in 2 hours if headache persists or recurs., Disp: , Rfl:    tamsulosin (FLOMAX) 0.4 MG CAPS capsule, Take 1 capsule (0.4 mg total) by mouth daily., Disp: 30 capsule, Rfl: 11   topiramate (TOPAMAX) 50 MG tablet, Take 50 mg by mouth 2 (two) times daily., Disp: , Rfl:    traZODone (DESYREL) 50 MG tablet, Take 100 mg by mouth at bedtime as needed for sleep. Take 1-2 tablets at bedtime as needed, Disp: , Rfl:   Current Facility-Administered Medications:    [START ON 07/15/2023] naloxegol oxalate (MOVANTIK) tablet 12.5 mg, 12.5 mg, Oral, BID, Yevette Edwards, MD   Past Medical History:  Diagnosis Date   Anxiety    Cancer [facility])    skin cancer   Chronic low back pain 05/09/2021   Chronic pain syndrome    Chronic, continuous use of opioids 05/09/2021   DDD (degenerative disc disease), cervical    Depression    Depression    Glaucoma    Heartburn    Hypertension    Kidney stone    Kidney stones    Knee pain, bilateral 05/09/2021   Migraine    Migraine    Post-traumatic headache    Sleep apnea    Traumatic arthritis    Assessment and Plan:   Follow Up Instructions: 1. Chronic bilateral low back pain without sciatica   2. Degeneration of intervertebral disc of lumbar region with discogenic back pain   3. Primary osteoarthritis of both knees   4. Chronic pain syndrome   5. Facet syndrome, lumbar   6. Pain in both hands   7. Chronic pain of both knees   8. Chronic, continuous use of opioids   9. Arthritis   10. Opioid-induced constipation    Based on our conversation think it is appropriate to refill his medicines for the next 2 months these will be dated for 1112 and 1212.  We will keep him at the morphine ER 30 mg dose 3 times a  day.  I have reviewed the [facility] practitioner database information is appropriate for refill.  He continues to respond favorably.  We will try him on Movantik to see if this is more effective than the Linzess for which he previously reported no success.  I have encouraged him to continue with fiber adequate hydration and stool softeners to assist.  Ultimately we may need to discontinue the opioid medications for a period if this persist.  He will let us know.  He is to try this at 1 tablet a day at the 12.5 mcg strength for  the first several days to see if this is successful and can increase to twice daily dosing.  Will schedule him for a 84-month return to clinic.  He is to contact us if he should have any problems.  Continue follow-up with his primary care physician for baseline medical care.   I discussed the assessment and treatment plan with the patient. The patient was provided an opportunity to ask questions and all were answered. The patient agreed with the plan and demonstrated an understanding of the instructions.   The patient was advised to call back or seek an in-person evaluation if the symptoms worsen or if the condition fails to improve as anticipated.  I provided 30 minutes of non-face-to-face time during this encounter.   Yevette Edwards, MD

## 2023-07-14 NOTE — Progress Notes (Signed)
I was unable to get in touch with Merlene Pulling and will reschedule for tomorrow

## 2023-07-16 ENCOUNTER — Telehealth: Payer: Self-pay | Admitting: Internal Medicine

## 2023-07-16 ENCOUNTER — Telehealth: Payer: Self-pay | Admitting: Anesthesiology

## 2023-07-16 NOTE — Telephone Encounter (Signed)
Dr Pernell Dupre told patient he was going to send in a script for a muscle relaxer to his pharmacy. Pharmacy says they did not receive anything. Please check with Dr Pernell Dupre and advise patient

## 2023-07-16 NOTE — Telephone Encounter (Signed)
Patient called back, the medicine he needs called in is for a laxative. The Linzess is not working.

## 2023-07-20 ENCOUNTER — Other Ambulatory Visit: Payer: Self-pay

## 2023-07-20 ENCOUNTER — Emergency Department: Payer: Medicare HMO

## 2023-07-20 ENCOUNTER — Emergency Department
Admission: EM | Admit: 2023-07-20 | Discharge: 2023-07-20 | Disposition: A | Discharge status: Home / Self Care | Payer: Medicare HMO | Attending: Student in an Organized Health Care Education/Training Program | Admitting: Student in an Organized Health Care Education/Training Program

## 2023-07-20 ENCOUNTER — Encounter: Payer: Self-pay | Admitting: Emergency Medicine

## 2023-07-20 DIAGNOSIS — M542 Cervicalgia: Secondary | ICD-10-CM | POA: Diagnosis not present

## 2023-07-20 DIAGNOSIS — R001 Bradycardia, unspecified: Secondary | ICD-10-CM | POA: Diagnosis not present

## 2023-07-20 DIAGNOSIS — W108XXA Fall (on) (from) other stairs and steps, initial encounter: Secondary | ICD-10-CM | POA: Insufficient documentation

## 2023-07-20 DIAGNOSIS — M4802 Spinal stenosis, cervical region: Secondary | ICD-10-CM | POA: Diagnosis not present

## 2023-07-20 DIAGNOSIS — S199XXA Unspecified injury of neck, initial encounter: Secondary | ICD-10-CM | POA: Diagnosis not present

## 2023-07-20 DIAGNOSIS — S2242XA Multiple fractures of ribs, left side, initial encounter for closed fracture: Secondary | ICD-10-CM | POA: Insufficient documentation

## 2023-07-20 DIAGNOSIS — R519 Headache, unspecified: Secondary | ICD-10-CM | POA: Diagnosis not present

## 2023-07-20 DIAGNOSIS — M47812 Spondylosis without myelopathy or radiculopathy, cervical region: Secondary | ICD-10-CM | POA: Diagnosis not present

## 2023-07-20 DIAGNOSIS — S299XXA Unspecified injury of thorax, initial encounter: Secondary | ICD-10-CM | POA: Diagnosis present

## 2023-07-20 DIAGNOSIS — W19XXXA Unspecified fall, initial encounter: Secondary | ICD-10-CM | POA: Diagnosis not present

## 2023-07-20 DIAGNOSIS — S0990XA Unspecified injury of head, initial encounter: Secondary | ICD-10-CM | POA: Diagnosis not present

## 2023-07-20 LAB — BASIC METABOLIC PANEL
Anion gap: 6 (ref 5–15)
BUN: 16 mg/dL (ref 8–23)
CO2: 24 mmol/L (ref 22–32)
Calcium: 9 mg/dL (ref 8.9–10.3)
Chloride: 110 mmol/L (ref 98–111)
Creatinine, Ser: 0.99 mg/dL (ref 0.61–1.24)
GFR, Estimated: >60 mL/min (ref >60)
Glucose, Bld: 91 mg/dL (ref 70–99)
Potassium: 3.7 mmol/L (ref 3.5–5.1)
Sodium: 140 mmol/L (ref 135–145)

## 2023-07-20 LAB — CBC
HCT: 40.5 % (ref 39.0–52.0)
Hemoglobin: 13.3 g/dL (ref 13.0–17.0)
MCH: 30.6 pg (ref 26.0–34.0)
MCHC: 32.8 g/dL (ref 30.0–36.0)
MCV: 93.1 fL (ref 80.0–100.0)
Platelets: 158 10*3/uL (ref 150–400)
RBC: 4.35 MIL/uL (ref 4.22–5.81)
RDW: 13.1 % (ref 11.5–15.5)
WBC: 13.4 10*3/uL — ABNORMAL HIGH (ref 4.0–10.5)
nRBC: 0 % (ref 0.0–0.2)

## 2023-07-20 LAB — TROPONIN I (HIGH SENSITIVITY): Troponin I (High Sensitivity): 9 ng/L (ref <18)

## 2023-07-20 MED ORDER — OXYCODONE-ACETAMINOPHEN 5-325 MG PO TABS: 1.0000 | ORAL_TABLET | ORAL | 0 refills | Status: DC | PRN

## 2023-07-20 MED ORDER — CYCLOBENZAPRINE HCL 10 MG PO TABS: 10.0000 mg | ORAL_TABLET | Freq: Every day | ORAL | 2 refills | Status: AC

## 2023-07-20 MED ORDER — OXYCODONE-ACETAMINOPHEN 5-325 MG PO TABS
1.0000 | ORAL_TABLET | Freq: Once | ORAL | Status: AC
  Administered 2023-07-20: 1 via ORAL
  Filled 2023-07-20: qty 1

## 2023-07-20 MED ORDER — LIDOCAINE 5 % EX PTCH
1.0000 | MEDICATED_PATCH | CUTANEOUS | Status: DC
  Administered 2023-07-20: 1 via TRANSDERMAL
  Filled 2023-07-20: qty 1

## 2023-07-20 MED ORDER — MORPHINE SULFATE (PF) 4 MG/ML IV SOLN
4.0000 mg | INTRAVENOUS | Status: DC | PRN
  Administered 2023-07-20 (×2): 4 mg via INTRAVENOUS
  Filled 2023-07-20 (×2): qty 1

## 2023-07-20 MED ORDER — ONDANSETRON HCL 4 MG/2ML IJ SOLN
4.0000 mg | Freq: Once | INTRAMUSCULAR | Status: AC
  Administered 2023-07-20: 4 mg via INTRAVENOUS
  Filled 2023-07-20: qty 2

## 2023-07-20 MED ORDER — LIDOCAINE 5 % EX PTCH: 1.0000 | MEDICATED_PATCH | Freq: Two times a day (BID) | CUTANEOUS | 0 refills | Status: DC

## 2023-07-20 NOTE — ED Notes (Signed)
Took pt warm blankets

## 2023-07-20 NOTE — Addendum Note (Signed)
Addended by: Yevette Edwards on: 07/20/2023 11:10 AM   Modules accepted: Orders

## 2023-07-20 NOTE — ED Provider Notes (Signed)
[facility] Provider Note    Event Date/Time   First MD Initiated Contact with Patient 07/20/23 1641     (approximate)   History   Fall   HPI  Tanner Baker is a 72 y.o. male who presents to the ER for evaluation of headache neck pain and left posterior chest wall pain that occurred after falling from attic steps that broke on him.  Larey Seat several steps to the ground landing on his left side.  Denies any abdominal pain.  No hip pain.  Not any anticoagulation.     Physical Exam   Triage Vital Signs: ED Triage Vitals  Encounter Vitals Group     BP 07/20/23 1552 124/65     Systolic BP Percentile --      Diastolic BP Percentile --      Pulse Rate 07/20/23 1552 (!) 57     Resp 07/20/23 1552 18     Temp 07/20/23 1552 98.5 F (36.9 C)     Temp Source 07/20/23 1552 Oral     SpO2 07/20/23 1552 100 %     Weight 07/20/23 1552 164 lb (74.4 kg)     Height 07/20/23 1552 5\' 9"  (1.753 m)     Head Circumference --      Peak Flow --      Pain Score 07/20/23 1553 8     Pain Loc --      Pain Education --      Exclude from Growth Chart --     Most recent vital signs: Vitals:   07/20/23 1930 07/20/23 2000  BP: 111/60 115/66  Pulse: (!) 59 (!) 57  Resp:    Temp:    SpO2: 99% 100%     Constitutional: Alert  Eyes: Conjunctivae are normal.  Head: Atraumatic. Nose: No congestion/rhinnorhea. Mouth/Throat: Mucous membranes are moist.   Neck: Painless ROM.  Cardiovascular:   Good peripheral circulation. Respiratory: Normal respiratory effort.  No retractions.  Gastrointestinal: Soft and nontender.  Musculoskeletal:  no deformity, chest wall tenderness to palpation. Neurologic:  MAE spontaneously. No gross focal neurologic deficits are appreciated.  Skin:  Skin is warm, dry and intact. No rash noted. Psychiatric: Mood and affect are normal. Speech and behavior are normal.    ED Results / Procedures / Treatments   Labs (all labs ordered are listed,  but only abnormal results are displayed) Labs Reviewed  CBC - Abnormal; Notable for the following components:      Result Value   WBC 13.4 (*)    All other components within normal limits  BASIC METABOLIC PANEL  CBG MONITORING, ED  TROPONIN I (HIGH SENSITIVITY)     EKG     RADIOLOGY Please see ED Course for my review and interpretation.  I personally reviewed all radiographic images ordered to evaluate for the above acute complaints and reviewed radiology reports and findings.  These findings were personally discussed with the patient.  Please see medical record for radiology report.    PROCEDURES:  Critical Care performed: No  Procedures   MEDICATIONS ORDERED IN ED: Medications  morphine (PF) 4 MG/ML injection 4 mg (4 mg Intravenous Given 07/20/23 2013)  lidocaine (LIDODERM) 5 % 1 patch (1 patch Transdermal Patch Applied 07/20/23 2037)  ondansetron (ZOFRAN) injection 4 mg (4 mg Intravenous Given 07/20/23 1720)  oxyCODONE-acetaminophen (PERCOCET/ROXICET) 5-325 MG per tablet 1 tablet (1 tablet Oral Given 07/20/23 2037)     IMPRESSION / MDM / ASSESSMENT AND PLAN / ED COURSE  I reviewed the triage vital signs and the nursing notes.                              Differential diagnosis includes, but is not limited to, sah, sdh, edh, fracture, contusion, soft tissue injury, viscous injury, concussion, hemorrhage  Patient presenting to the ER for evaluation of symptoms as described above.  Based on symptoms, risk factors and considered above differential, this presenting complaint could reflect a potentially life-threatening illness therefore the patient will be placed on continuous pulse oximetry and telemetry for monitoring.  Laboratory evaluation will be sent to evaluate for the above complaints.  CT imaging will be ordered for the but differential.   Clinical Course as of 07/20/23 2043  [facility] Jul 20, 2023  1722 CT chest on my review and interpretation without evidence of  pneumothorax.  I do see evidence of nondisplaced rib fractures will await formal radiology report. [PR]  2028 With 3 left-sided rib fractures.  No evidence of other associated traumatic injury.  Discussed option for admission to the hospital for pain control versus outpatient management.  States that he is feeling improved would like to try p.o. pain medication and ambulation trial would prefer discharge if at all possible.  I think that that is reasonable given that he has support at home, is not hypoxic or showing any signs of respiratory distress. [PR]  2043 Able to ambulate with steady gait.  Pain is well-controlled.  Discussed return precautions. [PR]    Clinical Course User Index [PR] Willy Eddy, MD     FINAL CLINICAL IMPRESSION(S) / ED DIAGNOSES   Final diagnoses:  Closed fracture of multiple ribs of left side, initial encounter     Rx / DC Orders   ED Discharge Orders          Ordered    oxyCODONE-acetaminophen (PERCOCET) 5-325 MG tablet  Every 4 hours PRN        07/20/23 2028    lidocaine (LIDODERM) 5 %  Every 12 hours        07/20/23 2028             Note:  This document was prepared using Dragon voice recognition software and may include unintentional dictation errors.    Willy Eddy, MD 07/20/23 2043

## 2023-07-20 NOTE — ED Triage Notes (Signed)
Pt presents after falling off ladder. Pt unable to remember why he was on ladder or what caused him to fall.   Denies any headache, lightheadedness or dizziness at this time.

## 2023-07-20 NOTE — ED Triage Notes (Signed)
First nurse note: Pt here via [facility] from home with c/o of falling 4 feet after falling off a ladder attempting to get to attic. EMS states pt can't remember the fall. Pt is A&O4 at this time. Pt had no LOC, pt is not currently on thinners at this time.   VSS per EMS

## 2023-07-20 NOTE — Telephone Encounter (Signed)
Attempted to call patient. Spoke with family member who reports that patient has fallen and has gone to the hospital.

## 2023-07-20 NOTE — Telephone Encounter (Signed)
Attempted to call patient, spoke with family member who reports he has fallen and has gone to the hospital.

## 2023-08-03 ENCOUNTER — Telehealth: Payer: Self-pay | Admitting: Anesthesiology

## 2023-08-03 NOTE — Telephone Encounter (Signed)
Patient called stating he fell 3 weeks ago and cracked some ribs. They gave him percocet. He is out and requesting Dr Pernell Dupre send a script for some more. I informed the patient Dr Pernell Dupre is not in. I scheduled patient for VV with Dr Aaron Mose Dec 3

## 2023-08-04 ENCOUNTER — Ambulatory Visit: Payer: Medicare HMO | Attending: Anesthesiology | Admitting: Anesthesiology

## 2023-08-04 ENCOUNTER — Encounter: Payer: Self-pay | Admitting: Anesthesiology

## 2023-08-04 DIAGNOSIS — M1990 Unspecified osteoarthritis, unspecified site: Secondary | ICD-10-CM | POA: Diagnosis not present

## 2023-08-04 DIAGNOSIS — S2249XA Multiple fractures of ribs, unspecified side, initial encounter for closed fracture: Secondary | ICD-10-CM | POA: Insufficient documentation

## 2023-08-04 DIAGNOSIS — M25561 Pain in right knee: Secondary | ICD-10-CM

## 2023-08-04 DIAGNOSIS — M79641 Pain in right hand: Secondary | ICD-10-CM | POA: Diagnosis not present

## 2023-08-04 DIAGNOSIS — F1190 Opioid use, unspecified, uncomplicated: Secondary | ICD-10-CM

## 2023-08-04 DIAGNOSIS — M170 Bilateral primary osteoarthritis of knee: Secondary | ICD-10-CM

## 2023-08-04 DIAGNOSIS — M5136 Other intervertebral disc degeneration, lumbar region with discogenic back pain only: Secondary | ICD-10-CM | POA: Diagnosis not present

## 2023-08-04 DIAGNOSIS — M79642 Pain in left hand: Secondary | ICD-10-CM | POA: Diagnosis not present

## 2023-08-04 DIAGNOSIS — M25562 Pain in left knee: Secondary | ICD-10-CM | POA: Diagnosis not present

## 2023-08-04 DIAGNOSIS — M47816 Spondylosis without myelopathy or radiculopathy, lumbar region: Secondary | ICD-10-CM

## 2023-08-04 DIAGNOSIS — G894 Chronic pain syndrome: Secondary | ICD-10-CM

## 2023-08-04 DIAGNOSIS — G8929 Other chronic pain: Secondary | ICD-10-CM

## 2023-08-04 DIAGNOSIS — S2249XD Multiple fractures of ribs, unspecified side, subsequent encounter for fracture with routine healing: Secondary | ICD-10-CM

## 2023-08-04 DIAGNOSIS — Z79891 Long term (current) use of opiate analgesic: Secondary | ICD-10-CM

## 2023-08-04 MED ORDER — MORPHINE SULFATE ER 30 MG PO TBCR: 30.0000 mg | EXTENDED_RELEASE_TABLET | Freq: Three times a day (TID) | ORAL | 0 refills | Status: DC

## 2023-08-04 MED ORDER — OXYCODONE-ACETAMINOPHEN 5-325 MG PO TABS: 1.0000 | ORAL_TABLET | Freq: Two times a day (BID) | ORAL | 0 refills | Status: DC

## 2023-08-04 NOTE — Progress Notes (Unsigned)
Virtual Visit via Telephone Note  I connected with Tanner Baker on 08/04/23 at 11:20 AM EST by telephone and verified that I am speaking with the correct person using two identifiers.  Location: Patient: Home Provider: Pain control center   I discussed the limitations, risks, security and privacy concerns of performing an evaluation and management service by telephone and the availability of in person appointments. I also discussed with the patient that there may be a patient responsible charge related to this service. The patient expressed understanding and agreed to proceed.   History of Present Illness: I spoke with Tanner Baker via telephone as we were unable link for the video portion conference.  He reports that he has recently sustained a fall.  He broke through several steps from his attic and sustained some rib fractures and a wrist injury.  He was placed on some short acting Percocet to assist with pain relief but this pain has persisted and been severe.  He has continued to take his baseline morphine for his low back and systemic pain.  He has been on that for a extended period of time to help with pain management.  This has continued to help with his baseline pain but the acute pain has been increasingly problematic and is interfering with his sleep at night and ability to stay active.  He was placed on Percocet transiently following his ER visit and this was helpful for the first several days but he has had recurrence of the chest wall pain.  The quality characteristic and distribution of the pain has been stable.  No changes in breathing or respiratory dysfunction are noted.  The quality characteristic and distribution of his baseline low back pain is also stable.  Otherwise he is in his usual state of health.  Review of systems: General: No fevers or chills Pulmonary: No shortness of breath or dyspnea Cardiac: No angina or palpitations or lightheadedness GI: No abdominal pain or  constipation Psych: No depression    Observations/Objective:  Current Outpatient Medications:    oxyCODONE-acetaminophen (PERCOCET) 5-325 MG tablet, Take 1 tablet by mouth 2 (two) times daily for 21 days., Disp: 42 tablet, Rfl: 0   Ascorbic Acid (VITAMIN C) 100 MG tablet, Take 100 mg by mouth daily. (Patient not taking: Reported on 06/18/2022), Disp: , Rfl:    bisacodyl (DULCOLAX) 5 MG EC tablet, Take 2 tablets (10 mg total) by mouth daily as needed for moderate constipation., Disp: 30 tablet, Rfl: 0   Cholecalciferol (VITAMIN D-3 PO), Take 1,000 Int'l Units/day by mouth every morning. Reported on 01/01/2016 (Patient not taking: Reported on 06/18/2022), Disp: , Rfl:    cyclobenzaprine (FLEXERIL) 10 MG tablet, Take 1 tablet (10 mg total) by mouth at bedtime., Disp: 30 tablet, Rfl: 2   diclofenac Sodium (VOLTAREN) 1 % GEL, Apply topically., Disp: , Rfl:    famotidine (PEPCID) 20 MG tablet, Take 20 mg by mouth 2 (two) times daily., Disp: , Rfl:    fluticasone (FLONASE) 50 MCG/ACT nasal spray, Place 1 spray into both nostrils daily as needed. Reported on 03/21/2016, Disp: , Rfl:    gabapentin (NEURONTIN) 100 MG capsule, Take 1 capsule (100 mg total) by mouth 3 (three) times daily., Disp: 90 capsule, Rfl: 3   hydrochlorothiazide (HYDRODIURIL) 25 MG tablet, Take 25 mg by mouth daily., Disp: , Rfl:    latanoprost (XALATAN) 0.005 % ophthalmic solution, Place 2 drops into both eyes at bedtime. , Disp: , Rfl:    lidocaine (LIDODERM) 5 %,  Place 1 patch onto the skin every 12 (twelve) hours. Remove & Discard patch within 12 hours or as directed by MD, Disp: 10 patch, Rfl: 0   linaclotide (LINZESS) 72 MCG capsule, Take 1 capsule (72 mcg total) by mouth daily before breakfast., Disp: 30 capsule, Rfl: 1   [START ON 08/13/2023] morphine (MS CONTIN) 30 MG 12 hr tablet, Take 1 tablet (30 mg total) by mouth 3 (three) times daily., Disp: 90 tablet, Rfl: 0   [START ON 09/12/2023] morphine (MS CONTIN) 30 MG 12 hr tablet,  Take 1 tablet (30 mg total) by mouth 3 (three) times daily., Disp: 90 tablet, Rfl: 0   naloxone (NARCAN) nasal spray 4 mg/0.1 mL, SMARTSIG:Both Nares, Disp: , Rfl:    ondansetron (ZOFRAN) 4 MG tablet, Take 1 tablet by mouth every 6 (six) hours as needed., Disp: , Rfl:    oxyCODONE-acetaminophen (PERCOCET) 5-325 MG tablet, Take 1 tablet by mouth every 4 (four) hours as needed for severe pain (pain score 7-10)., Disp: 10 tablet, Rfl: 0   pantoprazole (PROTONIX) 40 MG tablet, Take by mouth., Disp: , Rfl:    PARoxetine (PAXIL) 30 MG tablet, Take 1 tablet by mouth daily., Disp: , Rfl:    propranolol (INDERAL) 20 MG tablet, Take 60 mg by mouth 2 (two) times daily. , Disp: , Rfl:    ranitidine (ZANTAC) 150 MG tablet, Take 150 mg by mouth 2 (two) times daily. (Patient not taking: Reported on 05/13/2023), Disp: , Rfl:    scopolamine (TRANSDERM-SCOP) 1 MG/3DAYS, 1 patch every 3 (three) days., Disp: , Rfl:    SUMAtriptan (IMITREX) 100 MG tablet, Take 100 mg by mouth once. May repeat in 2 hours if headache persists or recurs., Disp: , Rfl:    tamsulosin (FLOMAX) 0.4 MG CAPS capsule, Take 1 capsule (0.4 mg total) by mouth daily., Disp: 30 capsule, Rfl: 11   topiramate (TOPAMAX) 50 MG tablet, Take 50 mg by mouth 2 (two) times daily., Disp: , Rfl:    traZODone (DESYREL) 50 MG tablet, Take 100 mg by mouth at bedtime as needed for sleep. Take 1-2 tablets at bedtime as needed, Disp: , Rfl:    MRI examination of the lumbar spine Assessment and Plan:  1. Chronic bilateral low back pain without sciatica   2. Degeneration of intervertebral disc of lumbar region with discogenic back pain   3. Primary osteoarthritis of both knees   4. Chronic pain syndrome   5. Facet syndrome, lumbar   6. Pain in both hands   7. Chronic pain of both knees   8. Chronic, continuous use of opioids   9. Arthritis   10. Closed fracture of multiple ribs with routine healing, unspecified laterality, subsequent encounter    Based on our  conversation I think is appropriate to refill his medications for his chronic low back and systemic pain.  The quality characteristic and distribution of this been stable and he is doing well with this regimen.  No side effects reported and he continues to get good relief with chronic opioid management.  He has been stable with this.  Refills be generated for the next 2 months dated for December 12 in January 11 on the morphine.  In addition to that to assist with acute on chronic pain management I am going to prescribe 5 mg Percocet to be taken twice a day for breakthrough pain from the rib fractures.  He can also take Tylenol separated by 4 to 6 hours from the Percocet to assist  with pain management.  If the pain persists I have offered him opportunity to come in for evaluation pain clinic for possible intercostal nerve block contingent on his response to current therapy.  Any change in symptom complex including respiratory dysfunction I have encouraged him to seek ER evaluation.  He has his Narcan available for any opioid related side effects and is aware of how to use this.  Scheduled for return to clinic in 2 months and continue follow-up with his primary care physicians for baseline medical care. Follow Up Instructions:    I discussed the assessment and treatment plan with the patient. The patient was provided an opportunity to ask questions and all were answered. The patient agreed with the plan and demonstrated an understanding of the instructions.   The patient was advised to call back or seek an in-person evaluation if the symptoms worsen or if the condition fails to improve as anticipated.  I provided 30 minutes of non-face-to-face time during this encounter.   Yevette Edwards, MD

## 2023-08-06 ENCOUNTER — Telehealth: Payer: Self-pay | Admitting: Anesthesiology

## 2023-08-06 NOTE — Telephone Encounter (Signed)
Patient is asking for stronger medication. Says the percocet prescribed is not strong enough. Wants a phone call asap

## 2023-08-06 NOTE — Telephone Encounter (Signed)
He will need to make an appt

## 2023-08-09 ENCOUNTER — Inpatient Hospital Stay
Admission: EM | Admit: 2023-08-09 | Discharge: 2023-08-17 | DRG: 871 | Disposition: A | Discharge status: Skilled Nursing Facility | Payer: Medicare HMO | Attending: Student | Admitting: Student

## 2023-08-09 ENCOUNTER — Emergency Department: Payer: Medicare HMO

## 2023-08-09 ENCOUNTER — Other Ambulatory Visit: Payer: Self-pay

## 2023-08-09 DIAGNOSIS — Z87891 Personal history of nicotine dependence: Secondary | ICD-10-CM | POA: Diagnosis not present

## 2023-08-09 DIAGNOSIS — A419 Sepsis, unspecified organism: Principal | ICD-10-CM | POA: Diagnosis present

## 2023-08-09 DIAGNOSIS — F1120 Opioid dependence, uncomplicated: Secondary | ICD-10-CM | POA: Diagnosis not present

## 2023-08-09 DIAGNOSIS — R69 Illness, unspecified: Secondary | ICD-10-CM | POA: Diagnosis not present

## 2023-08-09 DIAGNOSIS — S20212D Contusion of left front wall of thorax, subsequent encounter: Secondary | ICD-10-CM

## 2023-08-09 DIAGNOSIS — Z833 Family history of diabetes mellitus: Secondary | ICD-10-CM

## 2023-08-09 DIAGNOSIS — S2249XD Multiple fractures of ribs, unspecified side, subsequent encounter for fracture with routine healing: Secondary | ICD-10-CM | POA: Diagnosis not present

## 2023-08-09 DIAGNOSIS — S20212A Contusion of left front wall of thorax, initial encounter: Secondary | ICD-10-CM | POA: Diagnosis not present

## 2023-08-09 DIAGNOSIS — Z888 Allergy status to other drugs, medicaments and biological substances status: Secondary | ICD-10-CM | POA: Diagnosis not present

## 2023-08-09 DIAGNOSIS — S32509D Unspecified fracture of unspecified pubis, subsequent encounter for fracture with routine healing: Secondary | ICD-10-CM | POA: Diagnosis not present

## 2023-08-09 DIAGNOSIS — D62 Acute posthemorrhagic anemia: Secondary | ICD-10-CM | POA: Diagnosis not present

## 2023-08-09 DIAGNOSIS — M4316 Spondylolisthesis, lumbar region: Secondary | ICD-10-CM | POA: Diagnosis not present

## 2023-08-09 DIAGNOSIS — F1190 Opioid use, unspecified, uncomplicated: Secondary | ICD-10-CM | POA: Diagnosis present

## 2023-08-09 DIAGNOSIS — Z8249 Family history of ischemic heart disease and other diseases of the circulatory system: Secondary | ICD-10-CM | POA: Diagnosis not present

## 2023-08-09 DIAGNOSIS — T402X5A Adverse effect of other opioids, initial encounter: Secondary | ICD-10-CM | POA: Diagnosis present

## 2023-08-09 DIAGNOSIS — E860 Dehydration: Secondary | ICD-10-CM | POA: Diagnosis not present

## 2023-08-09 DIAGNOSIS — K5903 Drug induced constipation: Secondary | ICD-10-CM | POA: Diagnosis present

## 2023-08-09 DIAGNOSIS — F32A Depression, unspecified: Secondary | ICD-10-CM | POA: Diagnosis not present

## 2023-08-09 DIAGNOSIS — R531 Weakness: Secondary | ICD-10-CM | POA: Diagnosis not present

## 2023-08-09 DIAGNOSIS — W108XXS Fall (on) (from) other stairs and steps, sequela: Secondary | ICD-10-CM

## 2023-08-09 DIAGNOSIS — G894 Chronic pain syndrome: Secondary | ICD-10-CM | POA: Diagnosis not present

## 2023-08-09 DIAGNOSIS — S32592D Other specified fracture of left pubis, subsequent encounter for fracture with routine healing: Secondary | ICD-10-CM

## 2023-08-09 DIAGNOSIS — J90 Pleural effusion, not elsewhere classified: Secondary | ICD-10-CM

## 2023-08-09 DIAGNOSIS — W19XXXA Unspecified fall, initial encounter: Secondary | ICD-10-CM | POA: Diagnosis not present

## 2023-08-09 DIAGNOSIS — M47816 Spondylosis without myelopathy or radiculopathy, lumbar region: Secondary | ICD-10-CM | POA: Diagnosis not present

## 2023-08-09 DIAGNOSIS — G43909 Migraine, unspecified, not intractable, without status migrainosus: Secondary | ICD-10-CM | POA: Diagnosis present

## 2023-08-09 DIAGNOSIS — Z7401 Bed confinement status: Secondary | ICD-10-CM | POA: Diagnosis not present

## 2023-08-09 DIAGNOSIS — S32110A Nondisplaced Zone I fracture of sacrum, initial encounter for closed fracture: Secondary | ICD-10-CM | POA: Diagnosis not present

## 2023-08-09 DIAGNOSIS — S32110D Nondisplaced Zone I fracture of sacrum, subsequent encounter for fracture with routine healing: Secondary | ICD-10-CM

## 2023-08-09 DIAGNOSIS — Z85828 Personal history of other malignant neoplasm of skin: Secondary | ICD-10-CM

## 2023-08-09 DIAGNOSIS — M533 Sacrococcygeal disorders, not elsewhere classified: Secondary | ICD-10-CM | POA: Diagnosis not present

## 2023-08-09 DIAGNOSIS — W109XXD Fall (on) (from) unspecified stairs and steps, subsequent encounter: Secondary | ICD-10-CM | POA: Diagnosis present

## 2023-08-09 DIAGNOSIS — J9811 Atelectasis: Secondary | ICD-10-CM | POA: Diagnosis not present

## 2023-08-09 DIAGNOSIS — I10 Essential (primary) hypertension: Secondary | ICD-10-CM | POA: Diagnosis present

## 2023-08-09 DIAGNOSIS — S2242XD Multiple fractures of ribs, left side, subsequent encounter for fracture with routine healing: Secondary | ICD-10-CM | POA: Diagnosis not present

## 2023-08-09 DIAGNOSIS — F419 Anxiety disorder, unspecified: Secondary | ICD-10-CM | POA: Diagnosis present

## 2023-08-09 DIAGNOSIS — G4700 Insomnia, unspecified: Secondary | ICD-10-CM | POA: Diagnosis present

## 2023-08-09 DIAGNOSIS — Z79899 Other long term (current) drug therapy: Secondary | ICD-10-CM

## 2023-08-09 DIAGNOSIS — J918 Pleural effusion in other conditions classified elsewhere: Secondary | ICD-10-CM | POA: Diagnosis present

## 2023-08-09 DIAGNOSIS — J189 Pneumonia, unspecified organism: Secondary | ICD-10-CM | POA: Diagnosis not present

## 2023-08-09 DIAGNOSIS — N400 Enlarged prostate without lower urinary tract symptoms: Secondary | ICD-10-CM | POA: Diagnosis present

## 2023-08-09 DIAGNOSIS — S2020XD Contusion of thorax, unspecified, subsequent encounter: Secondary | ICD-10-CM | POA: Diagnosis not present

## 2023-08-09 DIAGNOSIS — Z043 Encounter for examination and observation following other accident: Secondary | ICD-10-CM | POA: Diagnosis not present

## 2023-08-09 DIAGNOSIS — S32502D Unspecified fracture of left pubis, subsequent encounter for fracture with routine healing: Secondary | ICD-10-CM | POA: Diagnosis not present

## 2023-08-09 DIAGNOSIS — S2249XA Multiple fractures of ribs, unspecified side, initial encounter for closed fracture: Secondary | ICD-10-CM | POA: Diagnosis present

## 2023-08-09 LAB — COMPREHENSIVE METABOLIC PANEL
ALT: 30 U/L (ref 0–44)
AST: 36 U/L (ref 15–41)
Albumin: 3.2 g/dL — ABNORMAL LOW (ref 3.5–5.0)
Alkaline Phosphatase: 312 U/L — ABNORMAL HIGH (ref 38–126)
Anion gap: 9 (ref 5–15)
BUN: 22 mg/dL (ref 8–23)
CO2: 24 mmol/L (ref 22–32)
Calcium: 8.6 mg/dL — ABNORMAL LOW (ref 8.9–10.3)
Chloride: 104 mmol/L (ref 98–111)
Creatinine, Ser: 1.21 mg/dL (ref 0.61–1.24)
GFR, Estimated: >60 mL/min (ref >60)
Glucose, Bld: 147 mg/dL — ABNORMAL HIGH (ref 70–99)
Potassium: 5 mmol/L (ref 3.5–5.1)
Sodium: 137 mmol/L (ref 135–145)
Total Bilirubin: 1.4 mg/dL — ABNORMAL HIGH (ref <1.2)
Total Protein: 6.5 g/dL (ref 6.5–8.1)

## 2023-08-09 LAB — TROPONIN I (HIGH SENSITIVITY): Troponin I (High Sensitivity): 8 ng/L (ref <18)

## 2023-08-09 LAB — CBC
HCT: 35.7 % — ABNORMAL LOW (ref 39.0–52.0)
Hemoglobin: 11.8 g/dL — ABNORMAL LOW (ref 13.0–17.0)
MCH: 31.6 pg (ref 26.0–34.0)
MCHC: 33.1 g/dL (ref 30.0–36.0)
MCV: 95.5 fL (ref 80.0–100.0)
Platelets: 341 10*3/uL (ref 150–400)
RBC: 3.74 MIL/uL — ABNORMAL LOW (ref 4.22–5.81)
RDW: 13.2 % (ref 11.5–15.5)
WBC: 26 10*3/uL — ABNORMAL HIGH (ref 4.0–10.5)
nRBC: 0 % (ref 0.0–0.2)

## 2023-08-09 LAB — CK: Total CK: 51 U/L (ref 49–397)

## 2023-08-09 LAB — LACTIC ACID, PLASMA: Lactic Acid, Venous: 1.2 mmol/L (ref 0.5–1.9)

## 2023-08-09 MED ORDER — METRONIDAZOLE 500 MG/100ML IV SOLN
500.0000 mg | Freq: Once | INTRAVENOUS | Status: AC
  Administered 2023-08-09: 500 mg via INTRAVENOUS
  Filled 2023-08-09 (×2): qty 100

## 2023-08-09 MED ORDER — ACETAMINOPHEN 325 MG PO TABS: 650.0000 mg | ORAL_TABLET | Freq: Four times a day (QID) | ORAL | Status: DC | PRN

## 2023-08-09 MED ORDER — VANCOMYCIN HCL IN DEXTROSE 1-5 GM/200ML-% IV SOLN: 1000.0000 mg | Freq: Once | INTRAVENOUS | Status: DC

## 2023-08-09 MED ORDER — IOHEXOL 300 MG/ML  SOLN
100.0000 mL | Freq: Once | INTRAMUSCULAR | Status: AC | PRN
Start: 2023-08-09 — End: 2023-08-09
  Administered 2023-08-09: 100 mL via INTRAVENOUS

## 2023-08-09 MED ORDER — BISACODYL 5 MG PO TBEC
10.0000 mg | DELAYED_RELEASE_TABLET | Freq: Every day | ORAL | Status: DC | PRN
  Administered 2023-08-14 (×2): 10 mg via ORAL
  Filled 2023-08-09 (×2): qty 2

## 2023-08-09 MED ORDER — SODIUM CHLORIDE 0.9 % IV BOLUS (SEPSIS)
1000.0000 mL | Freq: Once | INTRAVENOUS | Status: AC
  Administered 2023-08-09: 1000 mL via INTRAVENOUS

## 2023-08-09 MED ORDER — SODIUM CHLORIDE 0.9 % IV SOLN
500.0000 mg | INTRAVENOUS | Status: AC
  Administered 2023-08-10 – 2023-08-12 (×3): 500 mg via INTRAVENOUS
  Filled 2023-08-09 (×4): qty 5

## 2023-08-09 MED ORDER — FAMOTIDINE 20 MG PO TABS: 20.0000 mg | ORAL_TABLET | Freq: Two times a day (BID) | ORAL | Status: DC

## 2023-08-09 MED ORDER — VANCOMYCIN HCL 1.5 G IV SOLR
1500.0000 mg | Freq: Once | INTRAVENOUS | Status: DC
  Filled 2023-08-09: qty 30

## 2023-08-09 MED ORDER — VANCOMYCIN HCL 1500 MG/300ML IV SOLN
1500.0000 mg | INTRAVENOUS | Status: DC
  Filled 2023-08-09: qty 300

## 2023-08-09 MED ORDER — SODIUM CHLORIDE 0.9 % IV BOLUS
1000.0000 mL | Freq: Once | INTRAVENOUS | Status: AC
  Administered 2023-08-09: 1000 mL via INTRAVENOUS

## 2023-08-09 MED ORDER — SODIUM CHLORIDE 0.9 % IV SOLN: 500.0000 mg | INTRAVENOUS | Status: DC

## 2023-08-09 MED ORDER — SODIUM CHLORIDE 0.9 % IV SOLN
2.0000 g | Freq: Once | INTRAVENOUS | Status: AC
  Administered 2023-08-09: 2 g via INTRAVENOUS
  Filled 2023-08-09: qty 12.5

## 2023-08-09 MED ORDER — MORPHINE SULFATE ER 15 MG PO TBCR: 30.0000 mg | EXTENDED_RELEASE_TABLET | Freq: Three times a day (TID) | ORAL | Status: DC

## 2023-08-09 MED ORDER — OXYCODONE-ACETAMINOPHEN 5-325 MG PO TABS
1.0000 | ORAL_TABLET | ORAL | Status: DC | PRN
  Administered 2023-08-10 – 2023-08-12 (×3): 1 via ORAL
  Filled 2023-08-09 (×3): qty 1

## 2023-08-09 MED ORDER — TAMSULOSIN HCL 0.4 MG PO CAPS
0.4000 mg | ORAL_CAPSULE | Freq: Every day | ORAL | Status: DC
  Administered 2023-08-10 – 2023-08-17 (×8): 0.4 mg via ORAL
  Filled 2023-08-09 (×8): qty 1

## 2023-08-09 MED ORDER — TOPIRAMATE 25 MG PO TABS: 50.0000 mg | ORAL_TABLET | Freq: Two times a day (BID) | ORAL | Status: DC

## 2023-08-09 MED ORDER — LINACLOTIDE 72 MCG PO CAPS
72.0000 ug | ORAL_CAPSULE | Freq: Every day | ORAL | Status: DC
  Administered 2023-08-11 – 2023-08-17 (×7): 72 ug via ORAL
  Filled 2023-08-09 (×8): qty 1

## 2023-08-09 MED ORDER — SODIUM CHLORIDE 0.9 % IV SOLN
2.0000 g | INTRAVENOUS | Status: AC
  Administered 2023-08-10 – 2023-08-14 (×5): 2 g via INTRAVENOUS
  Filled 2023-08-09 (×5): qty 20

## 2023-08-09 MED ORDER — ONDANSETRON HCL 4 MG/2ML IJ SOLN
4.0000 mg | Freq: Four times a day (QID) | INTRAMUSCULAR | Status: DC | PRN
  Administered 2023-08-14: 4 mg via INTRAVENOUS
  Filled 2023-08-09: qty 2

## 2023-08-09 MED ORDER — MORPHINE SULFATE (PF) 4 MG/ML IV SOLN
4.0000 mg | Freq: Once | INTRAVENOUS | Status: AC
  Administered 2023-08-09: 4 mg via INTRAVENOUS
  Filled 2023-08-09: qty 1

## 2023-08-09 MED ORDER — PANTOPRAZOLE SODIUM 40 MG PO TBEC
40.0000 mg | DELAYED_RELEASE_TABLET | Freq: Every day | ORAL | Status: DC
  Administered 2023-08-10 – 2023-08-17 (×8): 40 mg via ORAL
  Filled 2023-08-09 (×8): qty 1

## 2023-08-09 MED ORDER — PAROXETINE HCL 30 MG PO TABS
30.0000 mg | ORAL_TABLET | Freq: Every day | ORAL | Status: DC
  Administered 2023-08-10 – 2023-08-17 (×8): 30 mg via ORAL
  Filled 2023-08-09 (×8): qty 1

## 2023-08-09 MED ORDER — VANCOMYCIN HCL 1.5 G IV SOLR
1500.0000 mg | Freq: Once | INTRAVENOUS | Status: AC
  Administered 2023-08-09: 1500 mg via INTRAVENOUS
  Filled 2023-08-09: qty 30

## 2023-08-09 MED ORDER — CYCLOBENZAPRINE HCL 10 MG PO TABS
10.0000 mg | ORAL_TABLET | Freq: Every day | ORAL | Status: DC
  Administered 2023-08-10 – 2023-08-16 (×8): 10 mg via ORAL
  Filled 2023-08-09 (×8): qty 1

## 2023-08-09 MED ORDER — SUMATRIPTAN SUCCINATE 50 MG PO TABS
100.0000 mg | ORAL_TABLET | Freq: Once | ORAL | Status: DC
Start: 2023-08-09 — End: 2023-08-10

## 2023-08-09 MED ORDER — TRAZODONE HCL 100 MG PO TABS
100.0000 mg | ORAL_TABLET | Freq: Every evening | ORAL | Status: DC | PRN
  Administered 2023-08-10: 100 mg via ORAL
  Filled 2023-08-09: qty 1

## 2023-08-09 MED ORDER — ACETAMINOPHEN 650 MG RE SUPP: 650.0000 mg | Freq: Four times a day (QID) | RECTAL | Status: DC | PRN

## 2023-08-09 MED ORDER — GABAPENTIN 100 MG PO CAPS
100.0000 mg | ORAL_CAPSULE | Freq: Three times a day (TID) | ORAL | Status: DC
  Administered 2023-08-10 – 2023-08-17 (×24): 100 mg via ORAL
  Filled 2023-08-09 (×24): qty 1

## 2023-08-09 MED ORDER — GUAIFENESIN ER 600 MG PO TB12: 600.0000 mg | ORAL_TABLET | Freq: Two times a day (BID) | ORAL | Status: DC | PRN

## 2023-08-09 MED ORDER — ONDANSETRON HCL 4 MG PO TABS
4.0000 mg | ORAL_TABLET | Freq: Four times a day (QID) | ORAL | Status: DC | PRN
  Administered 2023-08-10: 4 mg via ORAL
  Filled 2023-08-09: qty 1

## 2023-08-09 NOTE — Progress Notes (Signed)
ED Pharmacy Antibiotic Sign Off An antibiotic consult was received from an ED provider for vancomycin and cefepime per pharmacy dosing for sepsis. A chart review was completed to assess appropriateness.   The following one time order(s) were placed:  Vancomycin 1500 mg IV x1 Cefepime 2 g IV x1  Further antibiotic and/or antibiotic pharmacy consults should be ordered by the admitting provider if indicated.   Thank you for allowing pharmacy to be a part of this patient's care.   Rockwell Alexandria, [facility]  Clinical Pharmacist 08/09/23 7:41 PM

## 2023-08-09 NOTE — Progress Notes (Signed)
Pt being followed by [facility] for Sepsis protocol. 

## 2023-08-09 NOTE — Assessment & Plan Note (Signed)
Continue Topamax, sumatriptan, propranolol

## 2023-08-09 NOTE — Assessment & Plan Note (Addendum)
Multiple rib fractures, left, 2 through 10 Chest wall hematoma Patient with generalized weakness, WBC 26,000 CT chest showing "Trace left pleural effusion and associated atelectasis or Consolidation" We will continue empiric antibiotics, Rocephin and azithromycin Follow procalcitonin, follow up lactic acid and blood cultures though not meeting sepsis criteria Incentive spirometer Guaifenesin Pain control PT OT consult Follow cultures

## 2023-08-09 NOTE — H&P (Signed)
History and Physical    Patient: Tanner Baker NWG:[id number] DOB: 1951-08-21 DOA: 08/09/2023 DOS: the patient was seen and examined on 08/09/2023 PCP: Lauro Regulus, MD  Patient coming from: Home  Chief Complaint:  Chief Complaint  Patient presents with   Tailbone Pain    HPI: Tanner Baker is a 72 y.o. male with medical history significant for chronic pain syndrome on chronic opiates, hypertension, depression, migraine headaches, BPH, HTN, insomnia, history of fall while stepping off his truck 10/2020 resulting in femur fracture s/p ORIF, who presents to the ED for the second time in 3 weeks after falling from the attic stairs.  Patient was fully seen in the ED on 07/20/2023.  He stated that the attic stairs broke and he fell down about 4 stairs onto the floor.  He was found to have 3 left-sided rib fractures with no evidence of other associated traumatic injury.  Since the fall he has been feeling very weak and has not been eating.  He fell again when he slipped off his bed, Hitting his low back but without other injury.  He ports tailbone pain but denies radiating pain down his legs, saddle anesthesia or urinary or bowel problems. ED course and data review: Vitals within normal limits. Labs notable for WBC of 26,000, hemoglobin 11.8, alk phos 312 and total bilirubin 1.4.  Troponin 8 and CK 51. Head CT nonacute CT chest abdomen and pelvis showing fractures of left 2 through 10th ribs with trace left pleural effusion, healing pubic symphysis and pubic rami fractures extensive hematoma throughout left chest not previously seen and possible consolidation as further detailed below: IMPRESSION: 1. Extensive fat stranding and hematoma throughout the left chest wall musculature, new compared to prior examination unlikely secondary to numerous rib fractures. As detailed above, there are nondisplaced, subacute fractures of the anterolateral left second through tenth ribs and of the posterior  left second through seventh ribs. Many of these fractures were not apparent on recent prior CT dated 07/20/2023; correlate for recurrent injury. 2. Trace left pleural effusion and associated atelectasis or consolidation. 3. Displaced, partially callused subacute fractures of the left pubic symphysis and adjacent pubic rami. 4. Minimally angulated, nondisplaced anterior cortical fracture of the S2 segment. 5. No CT evidence of acute traumatic injury to organs of the chest, abdomen, or pelvis.  Patient started on cefepime and vancomycin Hospitalist consulted for admission.   Review of Systems: As mentioned in the history of present illness. All other systems reviewed and are negative.  Past Medical History:  Diagnosis Date   Anxiety    Cancer (HCC)    skin cancer   Chronic low back pain 05/09/2021   Chronic pain syndrome    Chronic, continuous use of opioids 05/09/2021   DDD (degenerative disc disease), cervical    Depression    Depression    Glaucoma    Heartburn    Hypertension    Kidney stone    Kidney stones    Knee pain, bilateral 05/09/2021   Migraine    Migraine    Post-traumatic headache    Sleep apnea    Traumatic arthritis    Past Surgical History:  Procedure Laterality Date   arm surg Right 1995   COSMETIC SURGERY  1994   all of teeth   INTRAMEDULLARY (IM) NAIL INTERTROCHANTERIC Right 11/27/2020   Procedure: INTRAMEDULLARY (IM) NAIL INTERTROCHANTRIC;  Surgeon: Signa Kell, MD;  Location: [facility] ORS;  Service: Orthopedics;  Laterality: Right;   SKIN CANCER EXCISION  TONSILLECTOMY     WISDOM TOOTH EXTRACTION     Social History:  reports that he has quit smoking. He has been exposed to tobacco smoke. He has never used smokeless tobacco. He reports that he does not drink alcohol and does not use drugs.  Allergies  Allergen Reactions   Doxycycline Rash    Family History  Problem Relation Age of Onset   Cancer Mother        melanoma   Lung cancer Mother     Diabetes Father    Heart disease Father    Cancer Father        melanoma   Kidney Stones Father    Prostate cancer Paternal Uncle    Kidney disease Neg Hx     Prior to Admission medications   Medication Sig Start Date End Date Taking? Authorizing Provider  Ascorbic Acid (VITAMIN C) 100 MG tablet Take 100 mg by mouth daily. Patient not taking: Reported on 06/18/2022    [provider]  bisacodyl (DULCOLAX) 5 MG EC tablet Take 2 tablets (10 mg total) by mouth daily as needed for moderate constipation. 11/29/20   Gillis Santa, MD  Cholecalciferol (VITAMIN D-3 PO) Take 1,000 Int'l Units/day by mouth every morning. Reported on 01/01/2016 Patient not taking: Reported on 06/18/2022    [provider]  cyclobenzaprine (FLEXERIL) 10 MG tablet Take 1 tablet (10 mg total) by mouth at bedtime. 07/20/23 10/18/23  Yevette Edwards, MD  diclofenac Sodium (VOLTAREN) 1 % GEL Apply topically. 05/02/20   [provider]  famotidine (PEPCID) 20 MG tablet Take 20 mg by mouth 2 (two) times daily. 04/30/21   [provider]  fluticasone (FLONASE) 50 MCG/ACT nasal spray Place 1 spray into both nostrils daily as needed. Reported on 03/21/2016 06/27/13   [provider]  gabapentin (NEURONTIN) 100 MG capsule Take 1 capsule (100 mg total) by mouth 3 (three) times daily. 05/13/23 09/10/23  Yevette Edwards, MD  hydrochlorothiazide (HYDRODIURIL) 25 MG tablet Take 25 mg by mouth daily. 06/13/13   [provider]  latanoprost (XALATAN) 0.005 % ophthalmic solution Place 2 drops into both eyes at bedtime.  06/13/13   [provider]  lidocaine (LIDODERM) 5 % Place 1 patch onto the skin every 12 (twelve) hours. Remove & Discard patch within 12 hours or as directed by MD 07/20/23 07/19/24  Willy Eddy, MD  linaclotide [facility]) 72 MCG capsule Take 1 capsule (72 mcg total) by mouth daily before breakfast. 06/18/22 08/17/22  Yevette Edwards, MD  morphine (MS CONTIN) 30 MG 12  hr tablet Take 1 tablet (30 mg total) by mouth 3 (three) times daily. 08/13/23 09/12/23  Yevette Edwards, MD  morphine (MS CONTIN) 30 MG 12 hr tablet Take 1 tablet (30 mg total) by mouth 3 (three) times daily. 09/12/23 10/12/23  Yevette Edwards, MD  naloxone [facility]) nasal spray 4 mg/0.1 mL SMARTSIG:Both Nares 11/14/20   [provider]  ondansetron (ZOFRAN) 4 MG tablet Take 1 tablet by mouth every 6 (six) hours as needed. 11/28/20   [provider]  oxyCODONE-acetaminophen (PERCOCET) 5-325 MG tablet Take 1 tablet by mouth every 4 (four) hours as needed for severe pain (pain score 7-10). 07/20/23 07/19/24  Willy Eddy, MD  oxyCODONE-acetaminophen (PERCOCET) 5-325 MG tablet Take 1 tablet by mouth 2 (two) times daily for 21 days. 08/04/23 08/25/23  Yevette Edwards, MD  pantoprazole (PROTONIX) 40 MG tablet Take by mouth. 08/13/22   [provider]  PARoxetine (PAXIL) 30 MG tablet Take 1 tablet by mouth daily. 04/30/21   [provider]  propranolol (INDERAL) 20 MG tablet Take 60 mg by mouth 2 (two) times daily.     [provider]  ranitidine (ZANTAC) 150 MG tablet Take 150 mg by mouth 2 (two) times daily. Patient not taking: Reported on 05/13/2023    [provider]  scopolamine (TRANSDERM-SCOP) 1 MG/3DAYS 1 patch every 3 (three) days. 04/30/21   [provider]  SUMAtriptan (IMITREX) 100 MG tablet Take 100 mg by mouth once. May repeat in 2 hours if headache persists or recurs.    [provider]  tamsulosin (FLOMAX) 0.4 MG CAPS capsule Take 1 capsule (0.4 mg total) by mouth daily. 05/08/22   Sondra Come, MD  topiramate (TOPAMAX) 50 MG tablet Take 50 mg by mouth 2 (two) times daily. 09/28/20   [provider]  traZODone (DESYREL) 50 MG tablet Take 100 mg by mouth at bedtime as needed for sleep. Take 1-2 tablets at bedtime as needed    [provider]    Physical Exam: Vitals:   08/09/23 1732 08/09/23 1734 08/09/23  2039  BP:  (!) 140/70 109/62  Pulse: 94  70  Resp: 18  17  Temp: 97.8 F (36.6 C)  98 F (36.7 C)  TempSrc: Oral    SpO2: 100%  98%  Weight: 74.3 kg     Physical Exam  Labs on Admission: I have personally reviewed following labs and imaging studies  CBC: Recent Labs  Lab 08/09/23 1850  WBC 26.0*  HGB 11.8*  HCT 35.7*  MCV 95.5  PLT 341   Basic Metabolic Panel: Recent Labs  Lab 08/09/23 1850  NA 137  K 5.0  CL 104  CO2 24  GLUCOSE 147*  BUN 22  CREATININE 1.21  CALCIUM 8.6*   GFR: Estimated Creatinine Clearance: 56.8 mL/min (by C-G formula based on SCr of 1.21 mg/dL). Liver Function Tests: Recent Labs  Lab 08/09/23 1850  AST 36  ALT 30  ALKPHOS 312*  BILITOT 1.4*  PROT 6.5  ALBUMIN 3.2*   No results for input(s): "LIPASE", "AMYLASE" in the last 168 hours. No results for input(s): "AMMONIA" in the last 168 hours. Coagulation Profile: No results for input(s): "INR", "PROTIME" in the last 168 hours. Cardiac Enzymes: Recent Labs  Lab 08/09/23 1850  CKTOTAL 51   BNP (last 3 results) No results for input(s): "PROBNP" in the last 8760 hours. HbA1C: No results for input(s): "HGBA1C" in the last 72 hours. CBG: No results for input(s): "GLUCAP" in the last 168 hours. Lipid Profile: No results for input(s): "CHOL", "HDL", "LDLCALC", "TRIG", "CHOLHDL", "LDLDIRECT" in the last 72 hours. Thyroid Function Tests: No results for input(s): "TSH", "T4TOTAL", "FREET4", "T3FREE", "THYROIDAB" in the last 72 hours. Anemia Panel: No results for input(s): "VITAMINB12", "FOLATE", "FERRITIN", "TIBC", "IRON", "RETICCTPCT" in the last 72 hours. Urine analysis:    Component Value Date/Time   COLORURINE AMBER (A) 11/17/2015 0720   APPEARANCEUR Clear 05/08/2017 1321   LABSPEC 1.019 11/17/2015 0720   PHURINE 7.0 11/17/2015 0720   GLUCOSEU Negative 05/08/2017 1321   HGBUR NEGATIVE 11/17/2015 0720   BILIRUBINUR Negative 05/08/2017 1321   KETONESUR 1+ (A) 11/17/2015 0720    PROTEINUR Negative 05/08/2017 1321   PROTEINUR 30 (A) 11/17/2015 0720   NITRITE Negative 05/08/2017 1321   NITRITE POSITIVE (A) 11/17/2015 0720   LEUKOCYTESUR Negative 05/08/2017 1321    Radiological Exams on Admission: CT CHEST ABDOMEN PELVIS  W CONTRAST  Result Date: 08/09/2023 CLINICAL DATA:  Fall 4 weeks ago, sepsis, coccygeal pain EXAM: CT CHEST, ABDOMEN, AND PELVIS WITH CONTRAST TECHNIQUE: Multidetector CT imaging of the chest, abdomen and pelvis was performed following the standard protocol during bolus administration of intravenous contrast. RADIATION DOSE REDUCTION: This exam was performed according to the departmental dose-optimization program which includes automated exposure control, adjustment of the mA and/or kV according to patient size and/or use of iterative reconstruction technique. CONTRAST:  OMNIPAQUE IOHEXOL 300 MG/ML  SOLN COMPARISON:  CT chest, 07/20/2023, CT abdomen pelvis, 03/05/2016 FINDINGS: CT CHEST FINDINGS Cardiovascular: No significant vascular findings. Normal heart size. No pericardial effusion. Mediastinum/Nodes: No enlarged mediastinal, hilar, or axillary lymph nodes. Thyroid gland, trachea, and esophagus demonstrate no significant findings. Lungs/Pleura: Lungs are clear. Trace left pleural effusion associated atelectasis or consolidation. Musculoskeletal: Extensive fat stranding and hematoma throughout the left chest wall musculature, new compared to prior examination (series 2, image 31). Numerous now subacute, partially callused fractures of the anterolateral left second through tenth ribs. Additional now subacute, nondisplaced fractures of the posterior left second through seventh ribs. CT ABDOMEN PELVIS FINDINGS Hepatobiliary: No solid liver abnormality is seen. No gallstones, gallbladder wall thickening, or biliary dilatation. Pancreas: Unremarkable. No pancreatic ductal dilatation or surrounding inflammatory changes. Spleen: Normal in size without  significant abnormality. Adrenals/Urinary Tract: Adrenal glands are unremarkable. Simple, benign bilateral renal cortical cysts, for which no further follow-up or characterization is required. Kidneys are otherwise normal, without renal calculi, solid lesion, or hydronephrosis. Bladder is unremarkable. Stomach/Bowel: Stomach is within normal limits. Appendix appears normal. No evidence of bowel wall thickening, distention, or inflammatory changes. Vascular/Lymphatic: Aortic atherosclerosis. No enlarged abdominal or pelvic lymph nodes. Reproductive: No mass or other abnormality. Other: No abdominal wall hernia or abnormality. No ascites. Musculoskeletal: Displaced, partially callused subacute fractures of the left pubic symphysis and adjacent pubic rami (series 2, image 113). Minimally angulated, nondisplaced anterior cortical fracture of the S2 segment (series 6, image 58). Intramedullary nail fixation of the right hip. IMPRESSION: 1. Extensive fat stranding and hematoma throughout the left chest wall musculature, new compared to prior examination unlikely secondary to numerous rib fractures. As detailed above, there are nondisplaced, subacute fractures of the anterolateral left second through tenth ribs and of the posterior left second through seventh ribs. Many of these fractures were not apparent on recent prior CT dated 07/20/2023; correlate for recurrent injury. 2. Trace left pleural effusion and associated atelectasis or consolidation. 3. Displaced, partially callused subacute fractures of the left pubic symphysis and adjacent pubic rami. 4. Minimally angulated, nondisplaced anterior cortical fracture of the S2 segment. 5. No CT evidence of acute traumatic injury to organs of the chest, abdomen, or pelvis. Aortic Atherosclerosis (ICD10-I70.0). Electronically Signed   By: Jearld Lesch M.D.   On: 08/09/2023 20:20   CT Head Wo Contrast  Result Date: 08/09/2023 CLINICAL DATA:  Fall 4 weeks ago.  Reported rib  fractures. EXAM: CT HEAD WITHOUT CONTRAST TECHNIQUE: Contiguous axial images were obtained from the base of the skull through the vertex without intravenous contrast. RADIATION DOSE REDUCTION: This exam was performed according to the departmental dose-optimization program which includes automated exposure control, adjustment of the mA and/or kV according to patient size and/or use of iterative reconstruction technique. COMPARISON:  07/20/2023. FINDINGS: Brain: No evidence of acute infarction, hemorrhage, hydrocephalus, extra-axial collection or mass lesion/mass effect. Vascular: No hyperdense vessel or unexpected calcification. Skull: Normal. Negative for fracture or focal lesion. Sinuses/Orbits: Globes and orbits are unremarkable.  Changes from prior sinus surgery. Sinuses are clear. Other: None. IMPRESSION: Normal head CT. Electronically Signed   By: Amie Portland M.D.   On: 08/09/2023 19:37   DG Lumbar Spine Complete  Result Date: 08/09/2023 CLINICAL DATA:  Coccygeal pain. Pain began this morning. Fall 4 weeks ago. EXAM: LUMBAR SPINE - COMPLETE 4+ VIEW COMPARISON:  CT abdomen and pelvis, 03/05/2016. FINDINGS: Mild chronic compression deformity of L1. Mild depression of the upper endplate of L2 that is new from the prior CT, unclear chronicity. All remaining lumbar vertebra are normal in stature. Grade 1 anterolisthesis of L4 on L5 and L5 on S1. Mild loss of disc height at T12-L1 and L3-L4. Moderate loss of disc height at L4-L5 and L5-S1. Facet degenerative changes bilaterally lower lumbar spine. Skeletal structures are diffusely demineralized. Soft tissues are unremarkable. IMPRESSION: 1. Mild depression of the upper endplate of L2 that is new from the prior CT, unclear chronicity. Correlate with pain in this region. 2. No other evidence of an acute fracture. 3. Chronic compression deformity of L1. 4. Degenerative changes as detailed. Electronically Signed   By: Amie Portland M.D.   On: 08/09/2023 19:35   DG  Sacrum/Coccyx  Result Date: 08/09/2023 CLINICAL DATA:  Larey Seat 4 weeks ago, coccygeal pain EXAM: SACRUM AND COCCYX - 2+ VIEW COMPARISON:  05/08/2022 FINDINGS: Frontal and lateral views of the sacrum and coccyx are obtained. There are no acute displaced fractures. Sacroiliac joints are unremarkable. Prominent spondylosis and facet hypertrophy within the lower lumbar spine. Mild degenerative grade 1 anterolisthesis of L4 on L5. Postsurgical changes are again noted within the right hip. IMPRESSION: 1. No acute fracture of the sacrum or coccyx. 2. Multilevel degenerative changes within the lower lumbar spine, with degenerative grade 1 anterolisthesis of L4 on L5. Electronically Signed   By: Sharlet Salina M.D.   On: 08/09/2023 19:35     Data Reviewed: Relevant notes from primary care and specialist visits, past discharge summaries as available in EHR, including Care Everywhere. Prior diagnostic testing as pertinent to current admission diagnoses Updated medications and problem lists for reconciliation ED course, including vitals, labs, imaging, treatment and response to treatment Triage notes, nursing and pharmacy notes and ED provider's notes Notable results as noted in HPI   Assessment and Plan: * Pneumonia with left pleural effusion, sequela from fall 07/20/23 Multiple rib fractures, 2 through 10 Chest wall hematoma Patient with generalized weakness, WBC 26,000 CT chest showing "Trace left pleural effusion and associated atelectasis or Consolidation" We will continue empiric antibiotics, Rocephin and azithromycin Follow procalcitonin, follow up lactic acid and blood cultures though not meeting sepsis criteria Incentive spirometer Guaifenesin Pain control PT OT consult Follow cultures   Pubic ramus fracture, left, with routine healing, subsequent encounter Fall from stairs 07/20/2023, sequela PT OT eval  Generalized weakness Likely related to pneumonia and traumatic injury PT OT  eval  Chronic pain syndrome Chronic opiates Continue home oxycodone, MS Contin and gabapentin and Flexeril  Depression Insomnia Continue trazodone nightly  Migraine Continue Topamax, sumatriptan, propranolol        DVT prophylaxis: SCD  Consults: none  Advance Care Planning:   Code Status: Prior   Family Communication: none  Disposition Plan: Back to previous home environment  Severity of Illness: The appropriate patient status for this patient is OBSERVATION. Observation status is judged to be reasonable and necessary in order to provide the required intensity of service to ensure the patient's safety. The patient's presenting symptoms, physical exam findings, and  initial radiographic and laboratory data in the context of their medical condition is felt to place them at decreased risk for further clinical deterioration. Furthermore, it is anticipated that the patient will be medically stable for discharge from the hospital within 2 midnights of admission.   Author: Andris Baumann, MD 08/09/2023 10:41 PM  For on call review [email or web address].

## 2023-08-09 NOTE — Assessment & Plan Note (Signed)
Chronic opiates Continue home oxycodone, MS Contin and gabapentin and Flexeril

## 2023-08-09 NOTE — Assessment & Plan Note (Signed)
Fall from stairs 07/20/2023, sequela PT OT eval

## 2023-08-09 NOTE — Assessment & Plan Note (Signed)
Insomnia Continue trazodone nightly

## 2023-08-09 NOTE — ED Triage Notes (Signed)
Pt in via EMS from home with sudden onset of pain in the coccyx. Pt reports started this am, has ben on morphine. Pt had a fall 4 weeks ago and broke some ribs, pt reports this pain started today out of nowhere.

## 2023-08-09 NOTE — Progress Notes (Signed)
CODE SEPSIS - PHARMACY COMMUNICATION  **Broad Spectrum Antibiotics should be administered within 1 hour of Sepsis diagnosis**  Time Code Sepsis Called/Page Received: 1940  Antibiotics Ordered: vancomcyin, cefepime, metronidazole  Time of 1st antibiotic administration: 1951  Additional action taken by pharmacy: None  If necessary, Name of Provider/Nurse Contacted: None    Tanner Baker ,PharmD Clinical Pharmacist  08/09/2023  8:23 PM

## 2023-08-09 NOTE — Assessment & Plan Note (Signed)
Likely related to pneumonia and traumatic injury PT OT eval

## 2023-08-09 NOTE — ED Provider Notes (Signed)
[facility] Provider Note    Event Date/Time   First MD Initiated Contact with Patient 08/09/23 1753     (approximate)   History   Tailbone Pain   HPI  Tanner Baker is a 72 y.o. male presents to the emergency department for not feeling well.  Patient had a fall from attic a couple of weeks ago and has not been doing well since that time.  States that he was doing okay but over the past 3 to 4 days has not had anything to eat or drink.  Has generalized weakness and fatigue.  States that he is not feeling well with severe pain to his lower back.  Had another slip out of bed where he hit his lower back but no other new falls or trauma since his fall from the attic.  Not on anticoagulation.  Denies any dysuria, urinary urgency or frequency.  States that he is has weakness that is worse in his left leg.  Denies any urinary or bowel incontinence.  No saddle anesthesia.     Physical Exam   Triage Vital Signs: ED Triage Vitals  Encounter Vitals Group     BP 08/09/23 1734 (!) 140/70     Systolic BP Percentile --      Diastolic BP Percentile --      Pulse Rate 08/09/23 1732 94     Resp 08/09/23 1732 18     Temp 08/09/23 1732 97.8 F (36.6 C)     Temp Source 08/09/23 1732 Oral     SpO2 08/09/23 1732 100 %     Weight 08/09/23 1732 163 lb 12.8 oz (74.3 kg)     Height --      Head Circumference --      Peak Flow --      Pain Score 08/09/23 1732 10     Pain Loc --      Pain Education --      Exclude from Growth Chart --     Most recent vital signs: Vitals:   08/09/23 1734 08/09/23 2039  BP: (!) 140/70 109/62  Pulse:  70  Resp:  17  Temp:  98 F (36.7 C)  SpO2:  98%    Physical Exam Constitutional:      Appearance: He is well-developed. He is ill-appearing.  HENT:     Head: Atraumatic.  Eyes:     Conjunctiva/sclera: Conjunctivae normal.  Cardiovascular:     Rate and Rhythm: Regular rhythm.  Pulmonary:     Effort: Respiratory distress  present.     Comments: Rhonchi with coarse breath sounds. Abdominal:     Tenderness: There is no abdominal tenderness.  Musculoskeletal:     Cervical back: Normal range of motion.     Comments: Tenderness to the lower the lumbar and sacral spine.  +2 DP and radial pulses.  5/5 strength bilateral lower extremities.  No saddle anesthesia.  Skin:    General: Skin is warm.  Neurological:     Mental Status: He is alert. Mental status is at baseline.      IMPRESSION / MDM / ASSESSMENT AND PLAN / ED COURSE  I reviewed the triage vital signs and the nursing notes.  On chart review patient had a recent fall where he fell from an attic and was diagnosed with multiple rib fractures.  On arrival patient is ill-appearing.  Concern for possible sepsis infection.  Sepsis order set utilized.  Possible new fracture or dislocation.  Differential  diagnosis including infectious process, dehydration, electrolyte abnormality, fracture, dislocation   RADIOLOGY I independently reviewed imaging, my interpretation of imaging: CT scan of the head without signs of intracranial hemorrhage  CT chest abdomen pelvis with contrast read is extensive fat stranding and hematoma throughout the chest wall musculature.  Multiple rib fractures that were not noted on most recent CT scan.  Trace left pleural effusion or atelectasis.  Displaced subacute fracture to the last pubic symphysis and pubic ramus.  Anterior cortical fracture of S2.   Labs (all labs ordered are listed, but only abnormal results are displayed) Labs interpreted as -    Labs Reviewed  CBC - Abnormal; Notable for the following components:      Result Value   WBC 26.0 (*)    RBC 3.74 (*)    Hemoglobin 11.8 (*)    HCT 35.7 (*)    All other components within normal limits  COMPREHENSIVE METABOLIC PANEL - Abnormal; Notable for the following components:   Glucose, Bld 147 (*)    Calcium 8.6 (*)    Albumin 3.2 (*)    Alkaline Phosphatase 312 (*)     Total Bilirubin 1.4 (*)    All other components within normal limits  CK  URINALYSIS, W/ REFLEX TO CULTURE (INFECTION SUSPECTED)  TROPONIN I (HIGH SENSITIVITY)      Given patient's generalized fatigue and weakness concern for possible sepsis.  Blood cultures obtained.  Given 1 L of IV fluids and will reevaluate.  Initial lactic acid was reassuring.  Do not feel that the patient needs 30 cc/kg of IV fluids.  Started on broad-spectrum antibiotics to cover unknown source however CT scan concerning for pneumonia.  Given IV pain medication.  Patient will need admitted for sepsis secondary to hematoma and pneumonia.   PROCEDURES:  Critical Care performed: yes  .Critical Care  Performed by: Corena Herter, MD Authorized by: Corena Herter, MD   Critical care provider statement:    Critical care time (minutes):  50   Critical care time was exclusive of:  Separately billable procedures and treating other patients   Critical care was necessary to treat or prevent imminent or life-threatening deterioration of the following conditions:  Sepsis and trauma   Critical care was time spent personally by me on the following activities:  Development of treatment plan with patient or surrogate, discussions with consultants, evaluation of patient's response to treatment, examination of patient, ordering and review of laboratory studies, ordering and review of radiographic studies, ordering and performing treatments and interventions, pulse oximetry, re-evaluation of patient's condition and review of old charts   Care discussed with: admitting provider     Patient's presentation is most consistent with acute presentation with potential threat to life or bodily function.   MEDICATIONS ORDERED IN ED: Medications  sodium chloride 0.9 % bolus 1,000 mL (1,000 mLs Intravenous New Bag/Given 08/09/23 2038)  metroNIDAZOLE (FLAGYL) IVPB 500 mg (has no administration in time range)  Vancomycin (VANCOCIN) 1,500  mg in sodium chloride 0.9 % 500 mL IVPB (has no administration in time range)  morphine (PF) 4 MG/ML injection 4 mg (has no administration in time range)  sodium chloride 0.9 % bolus 1,000 mL (0 mLs Intravenous Stopped 08/09/23 2041)  ceFEPIme (MAXIPIME) 2 g in sodium chloride 0.9 % 100 mL IVPB (2 g Intravenous New Bag/Given 08/09/23 1951)  iohexol (OMNIPAQUE) 300 MG/ML solution 100 mL (100 mLs Intravenous Contrast Given 08/09/23 2000)    FINAL CLINICAL IMPRESSION(S) / ED DIAGNOSES  Final diagnoses:  Chest wall hematoma, left, initial encounter  Community acquired pneumonia of left lung, unspecified part of lung  Dehydration  Closed fracture of ramus of left pubis with routine healing, subsequent encounter  Closed nondisplaced zone I fracture of sacrum, initial encounter (HCC)     Rx / DC Orders   ED Discharge Orders     None        Note:  This document was prepared using Dragon voice recognition software and may include unintentional dictation errors.   Corena Herter, MD 08/09/23 2051

## 2023-08-09 NOTE — Progress Notes (Signed)
Secure chatted Attending MD inquiring about lactic and cultures for Code Sepsis. Stated unaware ED MD had activated Code Sepsis, blood work completed after 2 antibiotics and IVF given. Will cont to follow.

## 2023-08-10 ENCOUNTER — Encounter: Payer: Self-pay | Admitting: Internal Medicine

## 2023-08-10 DIAGNOSIS — S20212A Contusion of left front wall of thorax, initial encounter: Secondary | ICD-10-CM | POA: Diagnosis not present

## 2023-08-10 DIAGNOSIS — S32592D Other specified fracture of left pubis, subsequent encounter for fracture with routine healing: Secondary | ICD-10-CM

## 2023-08-10 DIAGNOSIS — E860 Dehydration: Secondary | ICD-10-CM

## 2023-08-10 DIAGNOSIS — S32110A Nondisplaced Zone I fracture of sacrum, initial encounter for closed fracture: Secondary | ICD-10-CM | POA: Diagnosis not present

## 2023-08-10 DIAGNOSIS — J189 Pneumonia, unspecified organism: Secondary | ICD-10-CM

## 2023-08-10 LAB — HEMOGLOBIN: Hemoglobin: 9 g/dL — ABNORMAL LOW (ref 13.0–17.0)

## 2023-08-10 LAB — CBC WITH DIFFERENTIAL/PLATELET
Abs Immature Granulocytes: 0.07 10*3/uL (ref 0.00–0.07)
Basophils Absolute: 0.1 10*3/uL (ref 0.0–0.1)
Basophils Relative: 1 %
Eosinophils Absolute: 0.1 10*3/uL (ref 0.0–0.5)
Eosinophils Relative: 1 %
HCT: 27.9 % — ABNORMAL LOW (ref 39.0–52.0)
Hemoglobin: 9.1 g/dL — ABNORMAL LOW (ref 13.0–17.0)
Immature Granulocytes: 1 %
Lymphocytes Relative: 13 %
Lymphs Abs: 1.7 10*3/uL (ref 0.7–4.0)
MCH: 31.2 pg (ref 26.0–34.0)
MCHC: 32.6 g/dL (ref 30.0–36.0)
MCV: 95.5 fL (ref 80.0–100.0)
Monocytes Absolute: 1.1 10*3/uL — ABNORMAL HIGH (ref 0.1–1.0)
Monocytes Relative: 9 %
Neutro Abs: 9.9 10*3/uL — ABNORMAL HIGH (ref 1.7–7.7)
Neutrophils Relative %: 75 %
Platelets: 211 10*3/uL (ref 150–400)
RBC: 2.92 MIL/uL — ABNORMAL LOW (ref 4.22–5.81)
RDW: 13.6 % (ref 11.5–15.5)
WBC: 13 10*3/uL — ABNORMAL HIGH (ref 4.0–10.5)
nRBC: 0 % (ref 0.0–0.2)

## 2023-08-10 LAB — PROCALCITONIN: Procalcitonin: <0.1 ng/mL

## 2023-08-10 LAB — LACTIC ACID, PLASMA: Lactic Acid, Venous: 1 mmol/L (ref 0.5–1.9)

## 2023-08-10 MED ORDER — TOPIRAMATE 25 MG PO TABS
50.0000 mg | ORAL_TABLET | Freq: Two times a day (BID) | ORAL | Status: DC
  Administered 2023-08-10 – 2023-08-17 (×15): 50 mg via ORAL
  Filled 2023-08-10 (×16): qty 2

## 2023-08-10 MED ORDER — FAMOTIDINE 20 MG PO TABS
20.0000 mg | ORAL_TABLET | Freq: Two times a day (BID) | ORAL | Status: DC
  Administered 2023-08-10 – 2023-08-17 (×15): 20 mg via ORAL
  Filled 2023-08-10 (×15): qty 1

## 2023-08-10 MED ORDER — MORPHINE SULFATE ER 15 MG PO TBCR
30.0000 mg | EXTENDED_RELEASE_TABLET | Freq: Three times a day (TID) | ORAL | Status: DC
  Administered 2023-08-10 – 2023-08-16 (×20): 30 mg via ORAL
  Filled 2023-08-10 (×20): qty 2

## 2023-08-10 MED ORDER — MORPHINE SULFATE (PF) 2 MG/ML IV SOLN
2.0000 mg | INTRAVENOUS | Status: DC | PRN
  Administered 2023-08-10 – 2023-08-12 (×4): 2 mg via INTRAVENOUS
  Filled 2023-08-10 (×4): qty 1

## 2023-08-10 NOTE — Progress Notes (Signed)
PT Cancellation Note  Patient Details Name: Tanner Baker MRN: [id number] DOB: 12-29-1950   Cancelled Treatment:    Reason Eval/Treat Not Completed: Other (comment): Orders Received. Chart Reviewed. Patient in significant pain, 10/10, and reports unable to participate in PT services. RN notified. Will re-attempt as medically appropriate and once pain is under control.    Creed Copper Fairly, PT, DPT 08/10/23 11:15 AM

## 2023-08-10 NOTE — Evaluation (Signed)
Occupational Therapy Evaluation Patient Details Name: Tanner Baker MRN: [id number] DOB: [birth year] Today's Date: 08/10/2023   History of Present Illness Tanner Baker is a 72 y.o. male with medical history significant for chronic pain syndrome on chronic opiates, hypertension, depression, migraine headaches, BPH, HTN, insomnia, history of fall while stepping off his truck 10/2020 resulting in femur fracture s/p ORIF, seen in the ED on 11/18 for rib fracture sustained from a fall off his attic stairs who now presents with tailbone pain.  He states that since his fall he slid off the bed hitting his back.  Since the fall he has been feeling very weak and has not been eating.  He denies radiating pain down his legs, saddle anesthesia or urinary or bowel problems.  Head CT nonacute CT chest abdomen and pelvis showing fractures of left 2-10th ribs with trace left pleural effusion, healing pubic symphysis and pubic rami fractures extensive hematoma throughout left chest not previously seen.   Clinical Impression   Pt was seen for OT evaluation this date. Prior to hospital admission, pt lived at home with his wife in a 1 level home with 4 STE and bil HR. Reports prior to his recent falls, he was IND with all ADLs, IADLs, driving and community mobile with SPC.    Pt presents to acute OT demonstrating impaired ADL performance and functional mobility 2/2 weakness, pain, balance deficits and limited activity tolerance (See OT problem list for additional functional deficits). Pt currently requires Max A x2 for all bed mobility d/t pain in his tailbone at 10/10. Notified RN of request for pain meds prior to session. Needed blocking of BLEs once seated EOB d/t shifting forward/posterior lean trying to offload weight off tailbone. Less than 1 min tolerance EOB before asking to return to supine. Max A for UB bathing to don gown seated EOB. Rolled to R sidelying to maximize comfort requiring Max A, but returned to  supine for comfort.  Pt would benefit from skilled OT services to address noted impairments and functional limitations (see below for any additional details) in order to maximize safety and independence while minimizing falls risk and caregiver burden. Do anticipate the need for follow up OT services upon acute hospital DC.        If plan is discharge home, recommend the following: Assistance with cooking/housework;Assist for transportation;Help with stairs or ramp for entrance;Two people to help with walking and/or transfers;A lot of help with bathing/dressing/bathroom    Functional Status Assessment  Patient has had a recent decline in their functional status and demonstrates the ability to make significant improvements in function in a reasonable and predictable amount of time.  Equipment Recommendations  BSC/3in1 (RW)    Recommendations for Other Services       Precautions / Restrictions Precautions Precautions: Fall;Other (comment) Precaution Comments: pubic symphysis and pubic rami fractures Restrictions Weight Bearing Restrictions: No      Mobility Bed Mobility Overal bed mobility: Needs Assistance Bed Mobility: Rolling, Supine to Sit, Sit to Supine Rolling: Max assist, Used rails   Supine to sit: Max assist, +2 for physical assistance Sit to supine: Max assist, +2 for physical assistance   General bed mobility comments: Max A to roll in bed to maximize comfort, however pt wishing to roll back to his back; Max A x2 for all bed mobility with 10/10 pain levels and screaming during all movements, shifting to EOB requiring blocking to prevent sliding OOB with less than 1 min seated tolerance EOB d/t pain  Transfers                   General transfer comment: deferred this date due to pain      Balance Overall balance assessment: Needs assistance Sitting-balance support: Feet supported, Bilateral upper extremity supported Sitting balance-Leahy Scale:  Poor Sitting balance - Comments: increased posterior lean, scooting forward due to pain                                   ADL either performed or assessed with clinical judgement   ADL Overall ADL's : Needs assistance/impaired                 Upper Body Dressing : Maximal assistance;Sitting                     General ADL Comments: Max A for UB dressing at bed level d/t pt's 10/10 pain level; likely to need increased assist d/t pain     Vision         Perception         Praxis         Pertinent Vitals/Pain Pain Assessment Pain Assessment: 0-10 Pain Score: 10-Worst pain ever Pain Location: Buttocks Pain Descriptors / Indicators: Aching, Sore Pain Intervention(s): Monitored during session, Limited activity within patient's tolerance, Patient requesting pain meds-RN notified     Extremity/Trunk Assessment Upper Extremity Assessment Upper Extremity Assessment: Generalized weakness   Lower Extremity Assessment Lower Extremity Assessment: Generalized weakness       Communication Communication Communication: No apparent difficulties   Cognition                                             General Comments       Exercises Other Exercises Other Exercises: Edu in role of OT in acute setting and positioning for pain management.   Shoulder Instructions      Home Living Family/patient expects to be discharged to:: Private residence Living Arrangements: Spouse/significant other Available Help at Discharge: Family Type of Home: House Home Access: Stairs to enter Secretary/administrator of Steps: 4 Entrance Stairs-Rails: Right;Left Home Layout: One level     Bathroom Shower/Tub: Chief Strategy Officer: Standard     Home Equipment: Cane - single point;Shower seat          Prior Functioning/Environment Prior Level of Function : Independent/Modified Independent             Mobility Comments:  Patient reports prior to admission/falls, was IND with mobility and was driving. ADLs Comments: IND with ADLs        OT Problem List: Decreased strength;Pain;Impaired balance (sitting and/or standing);Decreased activity tolerance      OT Treatment/Interventions: Self-care/ADL training;Therapeutic exercise;Patient/family education;Balance training;Energy conservation;Therapeutic activities    OT Goals(Current goals can be found in the care plan section) Acute Rehab OT Goals Patient Stated Goal: improve pain and strength OT Goal Formulation: With patient Time For Goal Achievement: 08/24/23 Potential to Achieve Goals: Good ADL Goals Pt Will Perform Grooming: with set-up;sitting Pt Will Perform Lower Body Bathing: with min assist;sitting/lateral leans;sit to/from stand Pt Will Perform Lower Body Dressing: with min assist;sit to/from stand;sitting/lateral leans Pt Will Transfer to Toilet: with min assist;ambulating;bedside commode Pt Will Perform Toileting - Clothing Manipulation and hygiene:  with min assist;sitting/lateral leans;sit to/from stand Additional ADL Goal #1: Pt will demo bed mobility with CGA and good safety to promote IND/safety to return to PLOF.  OT Frequency: Min 1X/week    Co-evaluation   Reason for Co-Treatment: Complexity of the patient's impairments (multi-system involvement);For patient/therapist safety PT goals addressed during session: Mobility/safety with mobility OT goals addressed during session: ADL's and self-care      AM-PAC OT "6 Clicks" Daily Activity     Outcome Measure Help from another person eating meals?: None Help from another person taking care of personal grooming?: A Little Help from another person toileting, which includes using toliet, bedpan, or urinal?: A Lot Help from another person bathing (including washing, rinsing, drying)?: A Lot Help from another person to put on and taking off regular upper body clothing?: A Lot Help from another  person to put on and taking off regular lower body clothing?: A Lot 6 Click Score: 15   End of Session Nurse Communication: Mobility status  Activity Tolerance: Patient limited by pain Patient left: in bed;with call bell/phone within reach;with bed alarm set  OT Visit Diagnosis: Other abnormalities of gait and mobility (R26.89);Repeated falls (R29.6);Muscle weakness (generalized) (M62.81)                Time: 0102-7253 OT Time Calculation (min): 21 min Charges:  OT General Charges $OT Visit: 1 Visit OT Evaluation $OT Eval Low Complexity: 1 Low  Tanner Baker, OTR/L 08/10/23, 4:25 PM Tanner Baker E Kieran Nachtigal 08/10/2023, 4:22 PM

## 2023-08-10 NOTE — ED Notes (Signed)
This Rn assisted patient with using the urinal in bed.

## 2023-08-10 NOTE — ED Notes (Signed)
Called the patient's wife and informed her that the patient is being transported to room 139 on the floor.

## 2023-08-10 NOTE — ED Notes (Signed)
Patient continues to complain of pain 10/10 in his back. This Rn Attempted to reach out to MD Djan via secure chat with no success. This Rn just sent a page to [facility].

## 2023-08-10 NOTE — ED Notes (Signed)
Patient's family has multiple questions for provider. Provider Meriam Sprague, Md notified via secure chat that family would like to speak with them.

## 2023-08-10 NOTE — ED Notes (Signed)
Patient given a breakfast tray.

## 2023-08-10 NOTE — Progress Notes (Signed)
Progress Note   Patient: Tanner Baker MVH:[id number] DOB: 01-21-1951 DOA: 08/09/2023     1 DOS: the patient was seen and examined on 08/10/2023   Brief hospital course: Tanner Baker is a 72 y.o. male with medical history significant for chronic pain syndrome on chronic opiates, hypertension, depression, migraine headaches, BPH, HTN, insomnia, history of fall while stepping off his truck 10/2020 resulting in femur fracture s/p ORIF, seen in the ED on 11/18 for rib fracture sustained from a fall off his attic stairs who now presents with tailbone pain.  He states that since his fall he slid off the bed hitting his back.  At the [facility], since the fall he has been feeling very weak and has not been eating.  He denies radiating pain down his legs, saddle anesthesia or urinary or bowel problems. ED course and data review: Vitals within normal limits. Labs notable for WBC of 26,000, hemoglobin 11.8, alk phos 312 and total bilirubin 1.4.  Troponin 8 and CK 51. Head CT nonacute CT chest abdomen and pelvis showing fractures of left 2 through 10th ribs with trace left pleural effusion, healing pubic symphysis and pubic rami fractures extensive hematoma throughout left chest not previously seen and possible consolidation as further detailed below:     Assessment and Plan:  Pneumonia with left pleural effusion, sequela from fall 07/20/23 Multiple rib fractures, left, 2 through 10 Chest wall hematoma Patient with generalized weakness, WBC 26,000 CT chest showing "Trace left pleural effusion and associated atelectasis or Consolidation" Continue Rocephin and azithromycin Follow-up on procalcitonin, follow up lactic acid and blood cultures though not meeting sepsis criteria Incentive spirometer Continue current pain control PT OT T on board Follow cultures     Pubic ramus and pubic symphysis fracture with routine healing, subsequent encounter Fall from stairs 07/20/2023, sequela PT OT eval Continue  morphine for severe pain   Generalized weakness Likely related to pneumonia and traumatic injury PT OT eval   Chronic pain syndrome Chronic opiates Continue home oxycodone, MS Contin and gabapentin and Flexeril   Depression Insomnia Continue trazodone nightly   Migraine Continue Topamax, sumatriptan, propranolol   DVT prophylaxis: SCD   Consults: none   Advance Care Planning:   Code Status: Prior    Family Communication: Discussed with patient's daughter and wife   Disposition Plan: Back to previous home environment  Subjective:  Patient seen and examined at bedside this morning in the presence of the daughter as well as the wife Admits to pain in the rib area Denies nausea vomiting abdominal pain chest pain or cough  Physical Exam: Vitals and nursing note reviewed.  Constitutional:      General: He is sleeping. He is not in acute distress. HENT:     Head: Normocephalic and atraumatic.  Cardiovascular:     Rate and Rhythm: Normal rate and regular rhythm.     Heart sounds: Normal heart sounds.  Pulmonary:     Effort: Pulmonary effort is normal.     Breath sounds: Normal breath sounds.  Abdominal:     Palpations: Abdomen is soft.     Tenderness: There is no abdominal tenderness.  Neurological:     Mental Status: He is easily aroused. Mental status is at baseline.     Disposition: Status is: Inpatient   Time spent: 56 minutes   Data Reviewed: I have reviewed imaging  as well as CT scan ras noted above  Vitals:   08/10/23 1255 08/10/23 1256 08/10/23 1257 08/10/23 1353  BP:    117/64  Pulse: 98 97 93 80  Resp: 11 12 12 18   Temp:    98.1 F (36.7 C)  TempSrc:    Oral  SpO2: 97% 97% 97% 98%  Weight:          Latest Ref Rng & Units 08/09/2023    6:50 PM 07/20/2023    3:57 PM 11/29/2020    5:59 AM  BMP  Glucose 70 - 99 mg/dL 161  91  096   BUN 8 - 23 mg/dL 22  16  22    Creatinine 0.61 - 1.24 mg/dL 0.45  4.09  8.11   Sodium 135 - 145 mmol/L 137  140   140   Potassium 3.5 - 5.1 mmol/L 5.0  3.7  3.8   Chloride 98 - 111 mmol/L 104  110  112   CO2 22 - 32 mmol/L 24  24  24    Calcium 8.9 - 10.3 mg/dL 8.6  9.0  8.2        Latest Ref Rng & Units 08/10/2023   10:17 AM 08/10/2023    5:07 AM 08/09/2023    6:50 PM  CBC  WBC 4.0 - 10.5 K/uL 13.0   26.0   Hemoglobin 13.0 - 17.0 g/dL 9.1  9.0  91.4   Hematocrit 39.0 - 52.0 % 27.9   35.7   Platelets 150 - 400 K/uL 211   341      Author: Loyce Dys, MD 08/10/2023 3:06 PM  For on call review [email or web address].

## 2023-08-10 NOTE — ED Notes (Signed)
Pt to bedside to  work with patient, but unable to work with him due to his pain. Will return later and try again once his pain is controlled.

## 2023-08-10 NOTE — Progress Notes (Signed)
Physical Therapy Evaluation Patient Details Name: Tanner Baker MRN: [id number] DOB: 1950/11/20 Today's Date: 08/10/2023  History of Present Illness  Tanner Baker is a 72 y.o. male with medical history significant for chronic pain syndrome on chronic opiates, hypertension, depression, migraine headaches, BPH, HTN, insomnia, history of fall while stepping off his truck 10/2020 resulting in femur fracture s/p ORIF, seen in the ED on 11/18 for rib fracture sustained from a fall off his attic stairs who now presents with tailbone pain.  He states that since his fall he slid off the bed hitting his back.  Since the fall he has been feeling very weak and has not been eating.  He denies radiating pain down his legs, saddle anesthesia or urinary or bowel problems.  Head CT nonacute CT chest abdomen and pelvis showing fractures of left 2-10th ribs with trace left pleural effusion, healing pubic symphysis and pubic rami fractures extensive hematoma throughout left chest not previously seen.   Clinical Impression  Chart Reviewed, Second Attempt this date. Patient supine in bed upon arrival, agreeable to PT evaluation this date despite 10/10 pain reported. Per patient reports, prior to admission was IND with mobility and ADLs, and was driving. Pt does report fall history in the past 6 months. On evaluation, patient require Max to roll with use of bed rails, and max A +2 for supine <> sit. Pt with heavy posterior lean and anterior scooting of hips with seated position, requiring therapist to block BLE for safety. Limited evaluation this date due to uncontrolled pain. Patient will benefit from skilled acute PT services to address functional impairments (see below for additional) and maximize functional mobility. Anticipate the need for follow up PT services upon acute hospital discharge. Will continue to follow acutely.        If plan is discharge home, recommend the following: A lot of help with walking and/or  transfers;A lot of help with bathing/dressing/bathroom;Assist for transportation;Help with stairs or ramp for entrance   Can travel by private vehicle   No    Equipment Recommendations Other (comment) (TBD at next level of care)  Recommendations for Other Services       Functional Status Assessment Patient has had a recent decline in their functional status and demonstrates the ability to make significant improvements in function in a reasonable and predictable amount of time.     Precautions / Restrictions Precautions Precautions: Fall;Other (comment) Precaution Comments: pubic symphysis and pubic rami fractures Restrictions Weight Bearing Restrictions: No      Mobility  Bed Mobility Overal bed mobility: Needs Assistance Bed Mobility: Rolling, Supine to Sit, Sit to Supine Rolling: Max assist, Used rails   Supine to sit: Max assist, +2 for physical assistance Sit to supine: Max assist, +2 for physical assistance   General bed mobility comments: Max A +2 for bed mobility; use of rails required, patient screaming in pain with any aspect of mobility. Withs eated EOB, patient scooting hips forward requiring therapist to block BLE to keep from sliding out of bed. +2 assist required for safety. minimal seated tolerance <1 minute to allow for gown change and return to supine. Attempted to provide relief in sidelying postiion but patient unable to tolerate.    Transfers                   General transfer comment: deferred this date due to pain    Ambulation/Gait               General  Gait Details: deferred this date due to pain  Stairs            Wheelchair Mobility     Tilt Bed    Modified Rankin (Stroke Patients Only)       Balance Overall balance assessment: Needs assistance Sitting-balance support: Feet supported, Bilateral upper extremity supported Sitting balance-Leahy Scale: Poor Sitting balance - Comments: increased posterior lean,  scooting forward due to pain                                     Pertinent Vitals/Pain Pain Assessment Pain Assessment: 0-10 Pain Score: 10-Worst pain ever Pain Location: Buttocks Pain Descriptors / Indicators: Aching, Sore Pain Intervention(s): Limited activity within patient's tolerance, Monitored during session    Home Living Family/patient expects to be discharged to:: Private residence Living Arrangements: Spouse/significant other Available Help at Discharge: Family Type of Home: House Home Access: Stairs to enter Entrance Stairs-Rails: Doctor, general practice of Steps: 4   Home Layout: One level Home Equipment: Cane - single point      Prior Function Prior Level of Function : Independent/Modified Independent             Mobility Comments: Patient reports prior to admission/falls, was IND with mobility and was driving. ADLs Comments: IND with ADLs     Extremity/Trunk Assessment   Upper Extremity Assessment Upper Extremity Assessment: Defer to OT evaluation    Lower Extremity Assessment Lower Extremity Assessment: Generalized weakness       Communication   Communication Communication: No apparent difficulties  Cognition Arousal: Alert Behavior During Therapy: WFL for tasks assessed/performed Overall Cognitive Status: Within Functional Limits for tasks assessed                                          General Comments      Exercises Other Exercises Other Exercises: Educ: Role of PT; positioning for pain relief.   Assessment/Plan    PT Assessment Patient needs continued PT services  PT Problem List Decreased strength;Decreased range of motion;Decreased activity tolerance;Decreased balance;Decreased mobility;Pain       PT Treatment Interventions DME instruction;Gait training;Stair training;Functional mobility training;Therapeutic activities;Balance training;Therapeutic exercise;Neuromuscular re-education     PT Goals (Current goals can be found in the Care Plan section)  Acute Rehab PT Goals Patient Stated Goal: get rid of the pain PT Goal Formulation: With patient Time For Goal Achievement: 08/24/23 Potential to Achieve Goals: Fair    Frequency Min 1X/week     Co-evaluation PT/OT/SLP Co-Evaluation/Treatment: Yes Reason for Co-Treatment: Complexity of the patient's impairments (multi-system involvement);For patient/therapist safety PT goals addressed during session: Mobility/safety with mobility OT goals addressed during session: ADL's and self-care       AM-PAC PT "6 Clicks" Mobility  Outcome Measure Help needed turning from your back to your side while in a flat bed without using bedrails?: A Lot Help needed moving from lying on your back to sitting on the side of a flat bed without using bedrails?: A Lot Help needed moving to and from a bed to a chair (including a wheelchair)?: A Lot Help needed standing up from a chair using your arms (e.g., wheelchair or bedside chair)?: A Lot Help needed to walk in hospital room?: A Lot Help needed climbing 3-5 steps with a railing? : A Lot  6 Click Score: 12    End of Session   Activity Tolerance: Patient limited by pain Patient left: in bed;with bed alarm set Nurse Communication: Mobility status;Other (comment) (RN notified of no call bell in room) PT Visit Diagnosis: Unsteadiness on feet (R26.81);Other abnormalities of gait and mobility (R26.89);History of falling (Z91.81);Muscle weakness (generalized) (M62.81);Pain Pain - part of body:  (Sacrum/Buttocks)    Time: 9811-9147 PT Time Calculation (min) (ACUTE ONLY): 19 min   Charges:   PT Evaluation $PT Eval Moderate Complexity: 1 Mod   PT General Charges $$ ACUTE PT VISIT: 1 Visit         Howie Ill, PT, DPT 08/10/23 3:43 PM

## 2023-08-11 DIAGNOSIS — S32110A Nondisplaced Zone I fracture of sacrum, initial encounter for closed fracture: Secondary | ICD-10-CM | POA: Diagnosis not present

## 2023-08-11 DIAGNOSIS — S20212A Contusion of left front wall of thorax, initial encounter: Secondary | ICD-10-CM | POA: Diagnosis not present

## 2023-08-11 DIAGNOSIS — S32592D Other specified fracture of left pubis, subsequent encounter for fracture with routine healing: Secondary | ICD-10-CM | POA: Diagnosis not present

## 2023-08-11 DIAGNOSIS — J189 Pneumonia, unspecified organism: Secondary | ICD-10-CM | POA: Diagnosis not present

## 2023-08-11 LAB — BASIC METABOLIC PANEL WITH GFR
Anion gap: 5 (ref 5–15)
BUN: 22 mg/dL (ref 8–23)
CO2: 25 mmol/L (ref 22–32)
Calcium: 8 mg/dL — ABNORMAL LOW (ref 8.9–10.3)
Chloride: 108 mmol/L (ref 98–111)
Creatinine, Ser: 0.94 mg/dL (ref 0.61–1.24)
GFR, Estimated: >60 mL/min
Glucose, Bld: 100 mg/dL — ABNORMAL HIGH (ref 70–99)
Potassium: 3.8 mmol/L (ref 3.5–5.1)
Sodium: 138 mmol/L (ref 135–145)

## 2023-08-11 LAB — CBC WITH DIFFERENTIAL/PLATELET
Abs Immature Granulocytes: 0.07 10*3/uL (ref 0.00–0.07)
Basophils Absolute: 0.1 10*3/uL (ref 0.0–0.1)
Basophils Relative: 1 %
Eosinophils Absolute: 0.2 10*3/uL (ref 0.0–0.5)
Eosinophils Relative: 2 %
HCT: 26.7 % — ABNORMAL LOW (ref 39.0–52.0)
Hemoglobin: 8.6 g/dL — ABNORMAL LOW (ref 13.0–17.0)
Immature Granulocytes: 1 %
Lymphocytes Relative: 22 %
Lymphs Abs: 2.4 10*3/uL (ref 0.7–4.0)
MCH: 31.3 pg (ref 26.0–34.0)
MCHC: 32.2 g/dL (ref 30.0–36.0)
MCV: 97.1 fL (ref 80.0–100.0)
Monocytes Absolute: 1.2 10*3/uL — ABNORMAL HIGH (ref 0.1–1.0)
Monocytes Relative: 11 %
Neutro Abs: 6.7 10*3/uL (ref 1.7–7.7)
Neutrophils Relative %: 63 %
Platelets: 198 10*3/uL (ref 150–400)
RBC: 2.75 MIL/uL — ABNORMAL LOW (ref 4.22–5.81)
RDW: 13.9 % (ref 11.5–15.5)
WBC: 10.6 10*3/uL — ABNORMAL HIGH (ref 4.0–10.5)
nRBC: 0 % (ref 0.0–0.2)

## 2023-08-11 LAB — HIV ANTIBODY (ROUTINE TESTING W REFLEX): HIV Screen 4th Generation wRfx: NONREACTIVE

## 2023-08-11 NOTE — Progress Notes (Signed)
Progress Note   Patient: Tanner Baker NGE:[id number] DOB: [birth year] DOA: 08/09/2023     2 DOS: the patient was seen and examined on 08/11/2023    Brief hospital course: Tanner Baker is a 72 y.o. male with medical history significant for chronic pain syndrome on chronic opiates, hypertension, depression, migraine headaches, BPH, HTN, insomnia, history of fall while stepping off his truck 10/2020 resulting in femur fracture s/p ORIF, seen in the ED on 11/18 for rib fracture sustained from a fall off his attic stairs who now presents with tailbone pain.  He states that since his fall he slid off the bed hitting his back.  At the [place], since the fall he has been feeling very weak and has not been eating.  He denies radiating pain down his legs, saddle anesthesia or urinary or bowel problems. ED course and data review: Vitals within normal limits. Labs notable for WBC of 26,000, hemoglobin 11.8, alk phos 312 and total bilirubin 1.4.  Troponin 8 and CK 51. Head CT nonacute CT chest abdomen and pelvis showing fractures of left 2 through 10th ribs with trace left pleural effusion, healing pubic symphysis and pubic rami fractures extensive hematoma throughout left chest not previously seen and possible consolidation as further detailed below:       Assessment and Plan:   Pneumonia with left pleural effusion, sequela from fall 07/20/23 Multiple rib fractures, left, 2 through 10 Chest wall hematoma Patient with generalized weakness, WBC 26,000 CT chest showing "Trace left pleural effusion and associated atelectasis or Consolidation" Continue Rocephin and azithromycin Procalcitonin and lactic acid normal  Continue incentive spirometer Continue current pain control Continue PT OT Follow cultures Avoiding anticoagulant at this time     Pubic ramus and pubic symphysis fracture with routine healing, subsequent encounter Fall from stairs 07/20/2023, sequela PT OT eval Continue morphine for  severe pain   Generalized weakness Likely related to pneumonia and traumatic injury Continue PT OT   Chronic pain syndrome Chronic opiates Continue home oxycodone, MS Contin and gabapentin and Flexeril   Depression Insomnia Continue trazodone nightly   Migraine Continue Topamax, sumatriptan, propranolol   DVT prophylaxis: SCD   Consults: none   Advance Care Planning:   Code Status: Prior    Family Communication: Discussed with patient's daughter and wife   Disposition Plan: Back to previous home environment   Subjective:  Patient seen and examined at bedside this morning in the presence of the daughter as well as the wife Pain in the pelvic region improved today Denies nausea vomiting   Physical Exam: Vitals and nursing note reviewed.  Constitutional:      General: He is sleeping. He is not in acute distress. HENT:     Head: Normocephalic and atraumatic.  Cardiovascular:     Rate and Rhythm: Normal rate and regular rhythm.     Heart sounds: Normal heart sounds.  Pulmonary:     Effort: Pulmonary effort is normal.     Breath sounds: Normal breath sounds.  Abdominal:     Palpations: Abdomen is soft.     Tenderness: There is no abdominal tenderness.  Neurological:     Mental Status: He is easily aroused. Mental status is at baseline.     Disposition: Status is: Inpatient    Time spent: 56 minutes     Data Reviewed: I have reviewed imaging  as well as CT scan ras noted above    Latest Ref Rng & Units 08/11/2023    5:08 AM 08/10/2023  10:17 AM 08/10/2023    5:07 AM  CBC  WBC 4.0 - 10.5 K/uL 10.6  13.0    Hemoglobin 13.0 - 17.0 g/dL 8.6  9.1  9.0   Hematocrit 39.0 - 52.0 % 26.7  27.9    Platelets 150 - 400 K/uL 198  211         Latest Ref Rng & Units 08/11/2023    5:08 AM 08/09/2023    6:50 PM 07/20/2023    3:57 PM  BMP  Glucose 70 - 99 mg/dL 440  102  91   BUN 8 - 23 mg/dL 22  22  16    Creatinine 0.61 - 1.24 mg/dL 7.25  3.66  4.40   Sodium 135 -  145 mmol/L 138  137  140   Potassium 3.5 - 5.1 mmol/L 3.8  5.0  3.7   Chloride 98 - 111 mmol/L 108  104  110   CO2 22 - 32 mmol/L 25  24  24    Calcium 8.9 - 10.3 mg/dL 8.0  8.6  9.0     Vitals:   08/10/23 2306 08/11/23 0343 08/11/23 0756 08/11/23 1513  BP: (!) 117/59 120/61 112/69 (!) 114/57  Pulse: 92 95 83 92  Resp: 20 20 17 17   Temp: 97.9 F (36.6 C) 98.7 F (37.1 C) 98.7 F (37.1 C) 98.1 F (36.7 C)  TempSrc:      SpO2: 96% 96% 97% 96%  Weight:         Author: Loyce Dys, MD 08/11/2023 4:26 PM  For on call review [email or web address].

## 2023-08-11 NOTE — Plan of Care (Signed)

## 2023-08-11 NOTE — Plan of Care (Signed)
Receiving IV abx for pneumonia. Pain management with morphine and percocet. Vitals stable, no complaints at this time. Will continue to monitor.  Problem: Education: Goal: Knowledge of General Education information will improve Description: Including pain rating scale, medication(s)/side effects and non-pharmacologic comfort measures Outcome: Progressing   Problem: Health Behavior/Discharge Planning: Goal: Ability to manage health-related needs will improve Outcome: Progressing   Problem: Clinical Measurements: Goal: Ability to maintain clinical measurements within normal limits will improve Outcome: Progressing Goal: Will remain free from infection Outcome: Progressing Goal: Diagnostic test results will improve Outcome: Progressing Goal: Respiratory complications will improve Outcome: Progressing Goal: Cardiovascular complication will be avoided Outcome: Progressing   Problem: Activity: Goal: Risk for activity intolerance will decrease Outcome: Progressing   Problem: Nutrition: Goal: Adequate nutrition will be maintained Outcome: Progressing   Problem: Coping: Goal: Level of anxiety will decrease Outcome: Progressing   Problem: Elimination: Goal: Will not experience complications related to bowel motility Outcome: Progressing Goal: Will not experience complications related to urinary retention Outcome: Progressing   Problem: Pain Management: Goal: General experience of comfort will improve Outcome: Progressing   Problem: Safety: Goal: Ability to remain free from injury will improve Outcome: Progressing   Problem: Skin Integrity: Goal: Risk for impaired skin integrity will decrease Outcome: Progressing   Problem: Activity: Goal: Ability to tolerate increased activity will improve Outcome: Progressing   Problem: Clinical Measurements: Goal: Ability to maintain a body temperature in the normal range will improve Outcome: Progressing   Problem:  Respiratory: Goal: Ability to maintain adequate ventilation will improve Outcome: Progressing Goal: Ability to maintain a clear airway will improve Outcome: Progressing

## 2023-08-12 DIAGNOSIS — S20212A Contusion of left front wall of thorax, initial encounter: Secondary | ICD-10-CM | POA: Diagnosis not present

## 2023-08-12 DIAGNOSIS — J90 Pleural effusion, not elsewhere classified: Secondary | ICD-10-CM

## 2023-08-12 DIAGNOSIS — S2249XD Multiple fractures of ribs, unspecified side, subsequent encounter for fracture with routine healing: Secondary | ICD-10-CM | POA: Diagnosis not present

## 2023-08-12 DIAGNOSIS — G894 Chronic pain syndrome: Secondary | ICD-10-CM | POA: Diagnosis not present

## 2023-08-12 LAB — BASIC METABOLIC PANEL
Anion gap: 4 — ABNORMAL LOW (ref 5–15)
BUN: 21 mg/dL (ref 8–23)
CO2: 26 mmol/L (ref 22–32)
Calcium: 8 mg/dL — ABNORMAL LOW (ref 8.9–10.3)
Chloride: 107 mmol/L (ref 98–111)
Creatinine, Ser: 0.82 mg/dL (ref 0.61–1.24)
GFR, Estimated: >60 mL/min (ref >60)
Glucose, Bld: 113 mg/dL — ABNORMAL HIGH (ref 70–99)
Potassium: 3.8 mmol/L (ref 3.5–5.1)
Sodium: 137 mmol/L (ref 135–145)

## 2023-08-12 LAB — CBC WITH DIFFERENTIAL/PLATELET
Abs Immature Granulocytes: 0.08 10*3/uL — ABNORMAL HIGH (ref 0.00–0.07)
Basophils Absolute: 0 10*3/uL (ref 0.0–0.1)
Basophils Relative: 0 %
Eosinophils Absolute: 0.4 10*3/uL (ref 0.0–0.5)
Eosinophils Relative: 4 %
HCT: 25.8 % — ABNORMAL LOW (ref 39.0–52.0)
Hemoglobin: 8.3 g/dL — ABNORMAL LOW (ref 13.0–17.0)
Immature Granulocytes: 1 %
Lymphocytes Relative: 19 %
Lymphs Abs: 1.8 10*3/uL (ref 0.7–4.0)
MCH: 31.4 pg (ref 26.0–34.0)
MCHC: 32.2 g/dL (ref 30.0–36.0)
MCV: 97.7 fL (ref 80.0–100.0)
Monocytes Absolute: 1 10*3/uL (ref 0.1–1.0)
Monocytes Relative: 11 %
Neutro Abs: 5.9 10*3/uL (ref 1.7–7.7)
Neutrophils Relative %: 65 %
Platelets: 183 10*3/uL (ref 150–400)
RBC: 2.64 MIL/uL — ABNORMAL LOW (ref 4.22–5.81)
RDW: 13.5 % (ref 11.5–15.5)
WBC: 9.1 10*3/uL (ref 4.0–10.5)
nRBC: 0 % (ref 0.0–0.2)

## 2023-08-12 MED ORDER — OXYCODONE HCL 5 MG PO TABS
5.0000 mg | ORAL_TABLET | ORAL | Status: DC | PRN
  Administered 2023-08-12 – 2023-08-13 (×2): 5 mg via ORAL
  Filled 2023-08-12 (×2): qty 1

## 2023-08-12 MED ORDER — POLYETHYLENE GLYCOL 3350 17 G PO PACK
17.0000 g | PACK | Freq: Every day | ORAL | Status: DC
  Administered 2023-08-12 – 2023-08-17 (×4): 17 g via ORAL
  Filled 2023-08-12 (×6): qty 1

## 2023-08-12 MED ORDER — MORPHINE SULFATE (PF) 2 MG/ML IV SOLN
2.0000 mg | INTRAVENOUS | Status: DC | PRN
  Administered 2023-08-14: 2 mg via INTRAVENOUS
  Filled 2023-08-12: qty 1

## 2023-08-12 MED ORDER — SENNOSIDES-DOCUSATE SODIUM 8.6-50 MG PO TABS
1.0000 | ORAL_TABLET | Freq: Two times a day (BID) | ORAL | Status: DC
  Administered 2023-08-12 – 2023-08-17 (×10): 1 via ORAL
  Filled 2023-08-12 (×11): qty 1

## 2023-08-12 MED ORDER — ACETAMINOPHEN 500 MG PO TABS
1000.0000 mg | ORAL_TABLET | Freq: Three times a day (TID) | ORAL | Status: DC
  Administered 2023-08-12 – 2023-08-15 (×8): 1000 mg via ORAL
  Filled 2023-08-12 (×9): qty 2

## 2023-08-12 NOTE — Plan of Care (Signed)
  Problem: Clinical Measurements: Goal: Will remain free from infection Outcome: Progressing Goal: Diagnostic test results will improve Outcome: Progressing Goal: Cardiovascular complication will be avoided Outcome: Progressing   Problem: Coping: Goal: Level of anxiety will decrease Outcome: Progressing

## 2023-08-12 NOTE — Progress Notes (Signed)
1940 - TELE called and said pt battery is low. RN replaced pt's TELE battery.

## 2023-08-12 NOTE — Progress Notes (Addendum)
Progress Note   Patient: Tanner Baker ZOX:[id number] DOB: [birth year] DOA: 08/09/2023     3 DOS: the patient was seen and examined on 08/12/2023    Brief hospital course: Tanner Baker is a 72 y.o. male with medical history significant for chronic pain syndrome on chronic opiates, hypertension, depression, migraine headaches, BPH, HTN, insomnia, history of fall while stepping off his truck 10/2020 resulting in femur fracture s/p ORIF, seen in the ED on 11/18 for rib fracture sustained from a fall off his attic stairs who now presents with tailbone pain.  He states that since his fall he slid off the bed hitting his back.  At the [place], since the fall he has been feeling very weak and has not been eating.  He denies radiating pain down his legs, saddle anesthesia or urinary or bowel problems. ED course and data review: Vitals within normal limits. Labs notable for WBC of 26,000, hemoglobin 11.8, alk phos 312 and total bilirubin 1.4.  Troponin 8 and CK 51. Head CT nonacute CT chest abdomen and pelvis showing fractures of left 2 through 10th ribs with trace left pleural effusion, healing pubic symphysis and pubic rami fractures extensive hematoma throughout left chest not previously seen and possible consolidation as further detailed below:       Assessment and Plan:   Pneumonia with left pleural effusion, sequela from fall 07/20/23 Multiple rib fractures, left, 2 through 10 Chest wall hematoma Sepsis due to pneumonia - POA with leukocytosis and tachycardia, in setting of PNA.   Patient with generalized weakness, WBC 26,000 CT chest showing "Trace left pleural effusion and associated atelectasis or consolidation" --Continue Rocephin and azithromycin --Continue incentive spirometer --Continue current pain control --Continue PT OT --Follow cultures --Avoiding anticoagulant at this time   Anemia due to acute blood loss (chest wall hematoma) Hbg 11.8 on 12/8 >> 9.0 >> 9.1 >> 8.6 >>  8.3 --Trend Hbg closely --Transfuse RBC's if Hbg < 7 --Monitor hematoma sight closely for signs of active bleeding --Unlikely any indication for vascular or IR intervention unless pt becomes unstable     Pubic ramus and pubic symphysis fracture with routine healing, subsequent encounter Fall from stairs 07/20/2023, sequela --PT OT evaluations - SNF recommendedmirl --Continue pain control per orders -- schedule Tylenol 1000 mg Q8H, PRN oxycodone, PRN IV morphine breakthrough, also on home MS Contin --Bowel regimen   Generalized weakness Likely related to pneumonia and traumatic injury Continue PT OT   Chronic pain syndrome Chronic opiates Continue home oxycodone, MS Contin and gabapentin and Flexeril   Depression Insomnia Continue trazodone nightly   Migraine Continue Topamax, sumatriptan, propranolol     DVT prophylaxis: SCD    Consults: none   Advance Care Planning:   Code Status: Prior     Family Communication: Multiple family members at bedside on rounds    Disposition Plan: SNF recommended      Subjective:  Patient seen and examined at bedside this morning in the presence of the daughter as well as the wife Pain in the pelvic region improved today Denies nausea vomiting     Physical Exam: General exam: awake, alert, no acute distress, chronically ill appearing HEENT: moist mucus membranes, hearing grossly normal  Respiratory system: CTAB with diminished bases due to poor inspiratory volumes, no wheezes or rhonchi, normal respiratory effort at rest, on room air. Cardiovascular system: normal S1/S2, RRR, no JVD, murmurs, rubs, gallops, no pedal edema.   Gastrointestinal system: soft, NT, ND, no HSM felt, +bowel sounds. Central  nervous system: A&O x 3. no gross focal neurologic deficits, normal speech Extremities: moves all, no edema, normal tone Skin: dry, intact, normal temperature Psychiatry: normal mood, congruent affect, judgement and insight  appear normal        Disposition: Status is: Inpatient -  Remains inpatient appropriate due to need for SNF placement, remains on IV antibiotics and closely monitoring anemia    Time spent: 42 minutes     Data Reviewed: I have reviewed imaging  as well as CT scan ras noted above    Latest Ref Rng & Units 08/12/2023    4:41 AM 08/11/2023    5:08 AM 08/10/2023   10:17 AM  CBC  WBC 4.0 - 10.5 K/uL 9.1  10.6  13.0   Hemoglobin 13.0 - 17.0 g/dL 8.3  8.6  9.1   Hematocrit 39.0 - 52.0 % 25.8  26.7  27.9   Platelets 150 - 400 K/uL 183  198  211        Latest Ref Rng & Units 08/12/2023    4:41 AM 08/11/2023    5:08 AM 08/09/2023    6:50 PM  BMP  Glucose 70 - 99 mg/dL 161  096  045   BUN 8 - 23 mg/dL 21  22  22    Creatinine 0.61 - 1.24 mg/dL 4.09  8.11  9.14   Sodium 135 - 145 mmol/L 137  138  137   Potassium 3.5 - 5.1 mmol/L 3.8  3.8  5.0   Chloride 98 - 111 mmol/L 107  108  104   CO2 22 - 32 mmol/L 26  25  24    Calcium 8.9 - 10.3 mg/dL 8.0  8.0  8.6     Vitals:   08/11/23 0756 08/11/23 1513 08/11/23 2312 08/12/23 0827  BP: 112/69 (!) 114/57 (!) 120/49 (!) 128/59  Pulse: 83 92 83 76  Resp: 17 17 20 16   Temp: 98.7 F (37.1 C) 98.1 F (36.7 C) 98.7 F (37.1 C) 98.2 F (36.8 C)  TempSrc:   Oral Oral  SpO2: 97% 96% 97% 97%  Weight:         Author: Pennie Banter, DO 08/12/2023 12:40 PM  For on call review [email or web address].

## 2023-08-13 DIAGNOSIS — J189 Pneumonia, unspecified organism: Secondary | ICD-10-CM | POA: Diagnosis not present

## 2023-08-13 DIAGNOSIS — S20212A Contusion of left front wall of thorax, initial encounter: Secondary | ICD-10-CM | POA: Diagnosis not present

## 2023-08-13 DIAGNOSIS — S2249XD Multiple fractures of ribs, unspecified side, subsequent encounter for fracture with routine healing: Secondary | ICD-10-CM | POA: Diagnosis not present

## 2023-08-13 DIAGNOSIS — J90 Pleural effusion, not elsewhere classified: Secondary | ICD-10-CM | POA: Diagnosis not present

## 2023-08-13 LAB — CBC
HCT: 25 % — ABNORMAL LOW (ref 39.0–52.0)
Hemoglobin: 7.9 g/dL — ABNORMAL LOW (ref 13.0–17.0)
MCH: 30.9 pg (ref 26.0–34.0)
MCHC: 31.6 g/dL (ref 30.0–36.0)
MCV: 97.7 fL (ref 80.0–100.0)
Platelets: 199 10*3/uL (ref 150–400)
RBC: 2.56 MIL/uL — ABNORMAL LOW (ref 4.22–5.81)
RDW: 13.8 % (ref 11.5–15.5)
WBC: 7.2 10*3/uL (ref 4.0–10.5)
nRBC: 0 % (ref 0.0–0.2)

## 2023-08-13 LAB — IRON AND TIBC
Iron: 41 ug/dL — ABNORMAL LOW (ref 45–182)
Saturation Ratios: 18 % (ref 17.9–39.5)
TIBC: 232 ug/dL — ABNORMAL LOW (ref 250–450)
UIBC: 191 ug/dL

## 2023-08-13 LAB — FERRITIN: Ferritin: 77 ng/mL (ref 24–336)

## 2023-08-13 MED ORDER — NON FORMULARY
1.0000 | Freq: Every day | Status: DC
  Administered 2023-08-14 – 2023-08-16 (×3): 1 via ORAL

## 2023-08-13 MED ORDER — OXYCODONE HCL 5 MG PO TABS
10.0000 mg | ORAL_TABLET | ORAL | Status: DC | PRN
  Administered 2023-08-13 – 2023-08-16 (×3): 10 mg via ORAL
  Filled 2023-08-13 (×2): qty 2

## 2023-08-13 MED ORDER — OXYCODONE HCL 5 MG PO TABS
5.0000 mg | ORAL_TABLET | ORAL | Status: DC | PRN
  Filled 2023-08-13 (×2): qty 1

## 2023-08-13 NOTE — Progress Notes (Signed)
RE: Tanner Baker Date of Birth: June 09, 1951 Date: 08/13/2023     To Whom It May Concern:   Please be advised that the above-named patient will require a short-term nursing home stay - anticipated 30 days or less for rehabilitation and strengthening.  The plan is for return home

## 2023-08-13 NOTE — Plan of Care (Signed)
  Problem: Clinical Measurements: Goal: Ability to maintain clinical measurements within normal limits will improve Outcome: Progressing Goal: Will remain free from infection Outcome: Progressing Goal: Diagnostic test results will improve Outcome: Progressing   Problem: Activity: Goal: Risk for activity intolerance will decrease Outcome: Progressing   

## 2023-08-13 NOTE — Progress Notes (Signed)
Progress Note   Patient: Tanner Baker TGG:[id number] DOB: 19-Mar-1951 DOA: 08/09/2023     4 DOS: the patient was seen and examined on 08/13/2023    Brief hospital course: Yohei Tysdal is a 72 y.o. male with medical history significant for chronic pain syndrome on chronic opiates, hypertension, depression, migraine headaches, BPH, HTN, insomnia, history of fall while stepping off his truck 10/2020 resulting in femur fracture s/p ORIF, seen in the ED on 11/18 for rib fracture sustained from a fall off his attic stairs who now presents with tailbone pain.  He states that since his fall he slid off the bed hitting his back.  At the [facility], since the fall he has been feeling very weak and has not been eating.  He denies radiating pain down his legs, saddle anesthesia or urinary or bowel problems. ED course and data review: Vitals within normal limits. Labs notable for WBC of 26,000, hemoglobin 11.8, alk phos 312 and total bilirubin 1.4.  Troponin 8 and CK 51. Head CT nonacute CT chest abdomen and pelvis showing fractures of left 2 through 10th ribs with trace left pleural effusion, healing pubic symphysis and pubic rami fractures extensive hematoma throughout left chest not previously seen and possible consolidation as further detailed below:       Assessment and Plan:   Pneumonia with left pleural effusion, sequela from fall 07/20/23 Multiple rib fractures, left, 2 through 10 Chest wall hematoma Sepsis due to pneumonia - POA with leukocytosis and tachycardia, in setting of PNA.   Patient with generalized weakness, WBC 26,000 CT chest showing "Trace left pleural effusion and associated atelectasis or consolidation" --Continue Rocephin and azithromycin --Continue incentive spirometer --Continue current pain control --Continue PT OT --Follow cultures --Avoiding anticoagulant at this time   Anemia due to acute blood loss (chest wall hematoma) Hbg 11.8 on 12/8 >> 9.0 >> 9.1 >> 8.6 >> 8.3 >>  7.9 --Trend Hbg closely --Transfuse RBC's if Hbg < 7 --Add on iron studies --Monitor hematoma sight closely for signs of active bleeding --Unlikely any indication for vascular or IR intervention unless pt becomes unstable     Pubic ramus and pubic symphysis fracture with routine healing, subsequent encounter Fall from stairs 07/20/2023, sequela --PT OT evaluations - SNF recommendedmirl --Continue pain control per orders -- schedule Tylenol 1000 mg Q8H, PRN oxycodone, PRN IV morphine breakthrough, also on home MS Contin --Bowel regimen   Generalized weakness Likely related to pneumonia and traumatic injury Continue PT OT   Chronic pain syndrome Chronic opiates Continue home oxycodone, MS Contin and gabapentin and Flexeril   Depression Insomnia Continue trazodone nightly   Migraine Continue Topamax, sumatriptan, propranolol     DVT prophylaxis: SCD    Consults: none   Advance Care Planning:   Code Status: Prior     Family Communication: family members at bedside on rounds    Disposition Plan: SNF recommended, placement pending      Subjective:  Patient seen with family at bedside this AM.  He reports pain overall controlled.  Denies CP or SOB, N/V.  +Constipation.  Family ask when pt will be walked around.  Pt denies acute complaints.     Physical Exam: General exam: awake, alert, no acute distress, chronically ill appearing HEENT: moist mucus membranes, hearing grossly normal  Respiratory system: CTAB, no wheezes or rhonchi, normal respiratory effort at rest, on room air. Cardiovascular system: normal S1/S2, RRR, no JVD, murmurs, rubs, gallops, no pedal edema.   Gastrointestinal system: soft, NT, ND  Central nervous system: A&O x 3. no gross focal neurologic deficits, normal speech Extremities: moves all, no edema, normal tone Skin: dry, intact, normal temperature Psychiatry: normal mood, congruent affect, judgement and insight appear normal         Disposition: Status is: Inpatient -  Remains inpatient appropriate due to need for SNF placement, remains on IV antibiotics and closely monitoring anemia    Time spent: 40 minutes     Data Reviewed: I have reviewed imaging  as well as CT scan ras noted above    Latest Ref Rng & Units 08/13/2023    4:37 AM 08/12/2023    4:41 AM 08/11/2023    5:08 AM  CBC  WBC 4.0 - 10.5 K/uL 7.2  9.1  10.6   Hemoglobin 13.0 - 17.0 g/dL 7.9  8.3  8.6   Hematocrit 39.0 - 52.0 % 25.0  25.8  26.7   Platelets 150 - 400 K/uL 199  183  198        Latest Ref Rng & Units 08/12/2023    4:41 AM 08/11/2023    5:08 AM 08/09/2023    6:50 PM  BMP  Glucose 70 - 99 mg/dL 161  096  045   BUN 8 - 23 mg/dL 21  22  22    Creatinine 0.61 - 1.24 mg/dL 4.09  8.11  9.14   Sodium 135 - 145 mmol/L 137  138  137   Potassium 3.5 - 5.1 mmol/L 3.8  3.8  5.0   Chloride 98 - 111 mmol/L 107  108  104   CO2 22 - 32 mmol/L 26  25  24    Calcium 8.9 - 10.3 mg/dL 8.0  8.0  8.6     Vitals:   08/12/23 0827 08/12/23 1553 08/12/23 2235 08/13/23 0812  BP: (!) 128/59 (!) 118/59 (!) 118/58 125/61  Pulse: 76 (!) 101 84 75  Resp: 16 18 20    Temp: 98.2 F (36.8 C) 98.1 F (36.7 C) 97.9 F (36.6 C) 98 F (36.7 C)  TempSrc: Oral Oral Oral   SpO2: 97% 99% 97% 100%  Weight:         Author: Pennie Banter, DO 08/13/2023 11:48 AM  For on call review [email or web address].

## 2023-08-13 NOTE — TOC Progression Note (Signed)
Transition of Care [facility]) - Progression Note    Patient Details  Name: Turney Blount MRN: [id number] Date of Birth: 08-10-1951  [facility]) CM/SW Contact  Marlowe Sax, RN Phone Number: 08/13/2023, 3:33 PM  Clinical Narrative:     The patient accepted [facility] bed offer from [facility], Ins auth pending       Expected Discharge Plan and Services                                               Social Determinants of Health (SDOH) Interventions SDOH Screenings   Food Insecurity: No Food Insecurity (08/10/2023)  Housing: Low Risk  (08/10/2023)  Transportation Needs: No Transportation Needs (08/10/2023)  Utilities: Not At Risk (08/10/2023)  Depression (PHQ2-9): Low Risk  (05/13/2023)  Financial Resource Strain: Low Risk  (02/26/2023)   Received from [facility], [facility]  Tobacco Use: Medium Risk (08/04/2023)    Readmission Risk Interventions     No data to display

## 2023-08-13 NOTE — TOC Progression Note (Signed)
Transition of Care [facility]) - Progression Note    Patient Details  Name: Tanner Baker MRN: [id number] Date of Birth: 20-May-1951  Transition [facility]) CM/SW Contact  Garret Reddish, RN Phone Number: 08/13/2023, 8:35 AM  Clinical Narrative:    Chart reviewed.  I meet with patient at bedside on yesterday.  HE reports that prior to admission he lived at home with his wife.  He reports that he was able to get around him home with a rolling walker.  He reports that he was still driving.    I have informed [name] that PT recommend SNF on discharge.  Patient is agreeable.  Patient reports that he lives in [place] and would like the closet facility in [place] as his wife is not able to drive long distances.  I have explained SNF process.  I did explain to patient that [facility] is the closest facility in [place]. Patient was agreeable to bed search in [place] area.    [name] reports that he has concerns about his medical bills.  I have asked Financial counselor to speak with patient about his hospital bill.   TOC will continue to follow for discharge planning.          Expected Discharge Plan and Services                                               Social Determinants of Health (SDOH) Interventions SDOH Screenings   Food Insecurity: No Food Insecurity (08/10/2023)  Housing: Low Risk  (08/10/2023)  Transportation Needs: No Transportation Needs (08/10/2023)  Utilities: Not At Risk (08/10/2023)  Depression (PHQ2-9): Low Risk  (05/13/2023)  Financial Resource Strain: Low Risk  (02/26/2023)   Received from [facility], [facility]  Tobacco Use: Medium Risk (08/04/2023)    Readmission Risk Interventions     No data to display

## 2023-08-13 NOTE — Progress Notes (Signed)
Imaging reviewed. Patient with L superior and inferior pubic rami fractures with L non-displaced sacral fracture. This is consistent with stable pelvic injury. No fracture around R hip IM nail.  - WBAT on LLE - No plan for surgical intervention - PT/OT for mobilization

## 2023-08-13 NOTE — Plan of Care (Signed)

## 2023-08-13 NOTE — NC FL2 (Signed)
[place] MEDICAID FL2 LEVEL OF CARE FORM     IDENTIFICATION  Patient Name: Tanner Baker Birthdate: 25-Feb-1951 Sex: male Admission Date (Current Location): 08/09/2023  [facility] and [place] Number:  Chiropodist and Address:  [facility], [address] [place], [place] [address]      Provider Number: [id number]  Attending Physician Name and Address:  Pennie Banter, DO  Relative Name and Phone Number:  Zaion Weers [phone]    Current Level of Care: Hospital Recommended Level of Care: Skilled Nursing Facility Prior Approval Number:    Date Approved/Denied:   PASRR Number: Pending  Discharge Plan: SNF    Current Diagnoses: Patient Active Problem List   Diagnosis Date Noted   Pneumonia with left pleural effusion, sequela from fall 07/20/23 08/09/2023   Generalized weakness 08/09/2023   Chest wall hematoma, left, initial encounter 08/09/2023   Pubic ramus and pubic symphysis fracture with routine healing, subsequent encounter 08/09/2023   Fall (on) (from) other stairs and steps(07/20/23), sequela 08/09/2023   Pleural effusion on left 08/09/2023   Multiple rib fractures, left 2 through 10, with chest wall hematoma 08/04/2023   Chronic low back pain 05/09/2021   Arthritis 05/09/2021   Hand pain 05/09/2021   Knee pain, bilateral 05/09/2021   Chronic, continuous use of opioids 05/09/2021   Femur fracture, right (HCC) 11/26/2020   Chronic pain syndrome 03/21/2016   Narrowing of intervertebral disc space 03/21/2016   Hypertension 03/21/2016   Migraine 03/21/2016   Acute traumatic arthritis 03/21/2016   Encounter for general adult medical examination without abnormal findings 09/24/2015   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   DDD (degenerative disc disease), lumbar 01/24/2015   Facet syndrome, lumbar 01/24/2015   Sacroiliac joint dysfunction 01/24/2015   Lumbar radiculopathy, chronic  01/24/2015   DJD (degenerative joint disease) of knee 01/24/2015   Major depression in remission (HCC) 08/30/2014   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   H/O melanoma in situ 12/08/2013   H/O neoplasm 12/08/2013   Anxiety 10/12/2013   Neuropathic pain 04/27/2013   Arthritis, degenerative 02/02/2013   Arthritis of knee, degenerative 02/02/2013   Chronic infection of sinus 01/26/2013   Sinus infection 01/26/2013   Encounter for other administrative examinations 08/16/2012   Hay fever 06/28/2012   Antritis chronic 06/28/2012   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   Error, refractive, myopia 02/10/2012   Actinic keratoses 06/17/2011   Idiopathic localized osteoarthropathy 05/13/2011   Depression 05/23/2009   Benign hypertension 05/23/2009   Low back pain 05/23/2009   Apnea, sleep 05/23/2009    Orientation RESPIRATION BLADDER Height & Weight     Self, Time, Situation, Place  Normal Continent Weight: 74.3 kg Height:     BEHAVIORAL SYMPTOMS/MOOD NEUROLOGICAL BOWEL NUTRITION STATUS      Continent  (See Discharge Summary)  AMBULATORY STATUS COMMUNICATION OF NEEDS Skin   Extensive Assist Verbally Normal                       Personal Care Assistance Level of Assistance  Bathing, Feeding, Dressing Bathing Assistance: Limited assistance Feeding assistance: Limited assistance Dressing Assistance: Maximum assistance     Functional Limitations Info  Sight, Hearing, Speech Sight Info: Adequate Hearing Info: Adequate Speech Info: Adequate    SPECIAL CARE FACTORS FREQUENCY  PT (By licensed PT), OT (By licensed OT)     PT Frequency: 5x Weekly OT Frequency: 5x Weekly  Contractures Contractures Info: Not present    Additional Factors Info  Code Status, Allergies Code Status Info: Full Code Allergies Info: Doxycycline           Current Medications (08/13/2023):  This is the current hospital active medication list Current Facility-Administered  Medications  Medication Dose Route Frequency Provider Last Rate Last Admin   acetaminophen (TYLENOL) tablet 1,000 mg  1,000 mg Oral Q8H Griffith, Kelly A, DO   1,000 mg at 08/12/23 2150   azithromycin (ZITHROMAX) 500 mg in sodium chloride 0.9 % 250 mL IVPB  500 mg Intravenous Q24H Lindajo Royal V, MD 250 mL/hr at 08/12/23 1537 500 mg at 08/12/23 1537   bisacodyl (DULCOLAX) EC tablet 10 mg  10 mg Oral Daily PRN Andris Baumann, MD       cefTRIAXone (ROCEPHIN) 2 g in sodium chloride 0.9 % 100 mL IVPB  2 g Intravenous Q24H Lindajo Royal V, MD 200 mL/hr at 08/13/23 0752 2 g at 08/13/23 0752   cyclobenzaprine (FLEXERIL) tablet 10 mg  10 mg Oral QHS Andris Baumann, MD   10 mg at 08/12/23 2151   famotidine (PEPCID) tablet 20 mg  20 mg Oral BID Andris Baumann, MD   20 mg at 08/12/23 2151   gabapentin (NEURONTIN) capsule 100 mg  100 mg Oral TID Andris Baumann, MD   100 mg at 08/12/23 2151   guaiFENesin (MUCINEX) 12 hr tablet 600 mg  600 mg Oral BID PRN Andris Baumann, MD       linaclotide Karlene Einstein) capsule 72 mcg  72 mcg Oral QAC breakfast Andris Baumann, MD   72 mcg at 08/13/23 0749   morphine (MS CONTIN) 12 hr tablet 30 mg  30 mg Oral TID Andris Baumann, MD   30 mg at 08/12/23 2150   morphine (PF) 2 MG/ML injection 2 mg  2 mg Intravenous Q4H PRN Esaw Grandchild A, DO       ondansetron (ZOFRAN) tablet 4 mg  4 mg Oral Q6H PRN Andris Baumann, MD   4 mg at 08/10/23 0035   Or   ondansetron (ZOFRAN) injection 4 mg  4 mg Intravenous Q6H PRN Andris Baumann, MD       oxyCODONE (Oxy IR/ROXICODONE) immediate release tablet 5 mg  5 mg Oral Q4H PRN Esaw Grandchild A, DO   5 mg at 08/13/23 0749   pantoprazole (PROTONIX) EC tablet 40 mg  40 mg Oral Daily Lindajo Royal V, MD   40 mg at 08/12/23 1009   PARoxetine (PAXIL) tablet 30 mg  30 mg Oral Daily Lindajo Royal V, MD   30 mg at 08/12/23 1010   polyethylene glycol (MIRALAX / GLYCOLAX) packet 17 g  17 g Oral Daily Esaw Grandchild A, DO   17 g at 08/12/23 1427    senna-docusate (Senokot-S) tablet 1 tablet  1 tablet Oral BID Esaw Grandchild A, DO   1 tablet at 08/12/23 1426   tamsulosin (FLOMAX) capsule 0.4 mg  0.4 mg Oral Daily Lindajo Royal V, MD   0.4 mg at 08/12/23 1009   topiramate (TOPAMAX) tablet 50 mg  50 mg Oral BID Andris Baumann, MD   50 mg at 08/12/23 2151   traZODone (DESYREL) tablet 100 mg  100 mg Oral QHS PRN Andris Baumann, MD   100 mg at 08/10/23 0038     Discharge Medications: Please see discharge summary for a list of discharge medications.  Relevant Imaging Results:  Relevant Lab  Results:   Additional Information [id number]  Garret Reddish, RN

## 2023-08-13 NOTE — Progress Notes (Signed)
Physical Therapy Treatment Patient Details Name: Tanner Baker MRN: [id number] DOB: 09-Apr-1951 Today's Date: 08/13/2023   History of Present Illness Tanner Baker is a 72 y.o. male with medical history significant for chronic pain syndrome on chronic opiates, hypertension, depression, migraine headaches, BPH, HTN, insomnia, history of fall while stepping off his truck 10/2020 resulting in femur fracture s/p ORIF, seen in the ED on 11/18 for rib fracture sustained from a fall off his attic stairs who now presents with tailbone pain.  He states that since his fall he slid off the bed hitting his back.  Since the fall he has been feeling very weak and has not been eating.  He denies radiating pain down his legs, saddle anesthesia or urinary or bowel problems.  Head CT nonacute CT chest abdomen and pelvis showing fractures of left 2-10th ribs with trace left pleural effusion, healing pubic symphysis and pubic rami fractures extensive hematoma throughout left chest not previously seen.    PT Comments  Pt received in bed, nursing in room switching bed for one with attached trapeze. Pt received Morphine, Neurontin, and Tylenol ~ 1 hour prior to PT arrival. Pt able to transfer to EOB with ModA, HOB raised and use of side rail. Several sit<>stand transfers completed with MinA to RW. Pt completed gait training with RW, CGA 30'x1, 60'x1 stating less pain in standing. Pt did not tolerate sitting in recliner chair more than 5 minutes despite pillow support, assisted back to bed at end of session. Continue PT per POC until pt transitions to STR   If plan is discharge home, recommend the following: A little help with walking and/or transfers;A little help with bathing/dressing/bathroom;Assistance with cooking/housework;Assistance with feeding;Assist for transportation;Help with stairs or ramp for entrance   Can travel by private vehicle     No  Equipment Recommendations  Other (comment) (TBD at next level of  care)    Recommendations for Other Services       Precautions / Restrictions Precautions Precautions: Fall Precaution Comments: pubic symphysis and pubic rami fractures Restrictions Weight Bearing Restrictions Per Provider Order: Yes Other Position/Activity Restrictions:  (B LE's WBAT)     Mobility  Bed Mobility Overal bed mobility: Needs Assistance Bed Mobility: Rolling, Supine to Sit, Sit to Supine Rolling: Min assist, Used rails   Supine to sit: Min assist, HOB elevated, Used rails Sit to supine: Mod assist, Used rails (ModA for LE's)   General bed mobility comments: Trapeze bar now available on bed    Transfers Overall transfer level: Needs assistance Equipment used: Rolling walker (2 wheels) Transfers: Sit to/from Stand Sit to Stand: Min assist           General transfer comment:  (Pt able to stand several times from bed and recliner with MinA, heavy use of UE's to standing at RW)    Ambulation/Gait Ambulation/Gait assistance: Contact guard assist Gait Distance (Feet): 60 Feet Assistive device: Rolling walker (2 wheels) Gait Pattern/deviations: Step-to pattern, Decreased step length - right, Decreased step length - left       General Gait Details: Less pain in standing, tolerated 30'x1, 60'x1 with rest break   Stairs             Wheelchair Mobility     Tilt Bed    Modified Rankin (Stroke Patients Only)       Balance Overall balance assessment: Needs assistance Sitting-balance support: Feet supported, Bilateral upper extremity supported Sitting balance-Leahy Scale: Fair     Standing balance support: Bilateral  upper extremity supported, During functional activity, Reliant on assistive device for balance Standing balance-Leahy Scale: Fair Standing balance comment: Less pain in standing, able to ambulate with CGA                            Cognition Arousal: Alert Behavior During Therapy: WFL for tasks  assessed/performed Overall Cognitive Status: Within Functional Limits for tasks assessed                                          Exercises General Exercises - Lower Extremity Ankle Circles/Pumps: AROM, Both, 10 reps, Seated Long Arc Quad: AROM, Both, 5 reps, Seated    General Comments General comments (skin integrity, edema, etc.):  (Pt educated on role of PT and importance to progress mobility daily. Pt encouraged to sit up in chair, only lasting ~5 minutes due to pain.)      Pertinent Vitals/Pain Pain Assessment Pain Assessment: 0-10 Pain Score: 8  Pain Location: pelvis, coccyx Pain Descriptors / Indicators: Aching, Grimacing, Moaning, Sore Pain Intervention(s): Limited activity within patient's tolerance, Repositioned, Patient requesting pain meds-RN notified    Home Living                          Prior Function            PT Goals (current goals can now be found in the care plan section) Acute Rehab PT Goals Patient Stated Goal: get rid of the pain Progress towards PT goals: Progressing toward goals    Frequency    Min 1X/week      PT Plan      Co-evaluation              AM-PAC PT "6 Clicks" Mobility   Outcome Measure  Help needed turning from your back to your side while in a flat bed without using bedrails?: A Lot Help needed moving from lying on your back to sitting on the side of a flat bed without using bedrails?: A Lot Help needed moving to and from a bed to a chair (including a wheelchair)?: A Little Help needed standing up from a chair using your arms (e.g., wheelchair or bedside chair)?: A Little Help needed to walk in hospital room?: A Little Help needed climbing 3-5 steps with a railing? : A Lot 6 Click Score: 15    End of Session Equipment Utilized During Treatment: Gait belt Activity Tolerance: Patient limited by pain Patient left: in bed;with call bell/phone within reach;with bed alarm set Nurse  Communication: Mobility status;Patient requests pain meds PT Visit Diagnosis: Unsteadiness on feet (R26.81);Other abnormalities of gait and mobility (R26.89);History of falling (Z91.81);Muscle weakness (generalized) (M62.81);Pain Pain - part of body:  (pelvis and coccyx area)     Time: 5409-8119 PT Time Calculation (min) (ACUTE ONLY): 24 min  Charges:    $Gait Training: 8-22 mins $Therapeutic Activity: 8-22 mins PT General Charges $$ ACUTE PT VISIT: 1 Visit                    Zadie Cleverly, PTA  Jannet Askew 08/13/2023, 5:00 PM

## 2023-08-14 DIAGNOSIS — S20212A Contusion of left front wall of thorax, initial encounter: Secondary | ICD-10-CM | POA: Diagnosis not present

## 2023-08-14 DIAGNOSIS — S2249XD Multiple fractures of ribs, unspecified side, subsequent encounter for fracture with routine healing: Secondary | ICD-10-CM | POA: Diagnosis not present

## 2023-08-14 DIAGNOSIS — G894 Chronic pain syndrome: Secondary | ICD-10-CM | POA: Diagnosis not present

## 2023-08-14 DIAGNOSIS — S32592D Other specified fracture of left pubis, subsequent encounter for fracture with routine healing: Secondary | ICD-10-CM | POA: Diagnosis not present

## 2023-08-14 LAB — CULTURE, BLOOD (ROUTINE X 2)
Culture: NO GROWTH
Culture: NO GROWTH
Special Requests: ADEQUATE

## 2023-08-14 LAB — CBC
HCT: 25.4 % — ABNORMAL LOW (ref 39.0–52.0)
Hemoglobin: 8.1 g/dL — ABNORMAL LOW (ref 13.0–17.0)
MCH: 31.3 pg (ref 26.0–34.0)
MCHC: 31.9 g/dL (ref 30.0–36.0)
MCV: 98.1 fL (ref 80.0–100.0)
Platelets: 197 10*3/uL (ref 150–400)
RBC: 2.59 MIL/uL — ABNORMAL LOW (ref 4.22–5.81)
RDW: 14 % (ref 11.5–15.5)
WBC: 8.6 10*3/uL (ref 4.0–10.5)
nRBC: 0 % (ref 0.0–0.2)

## 2023-08-14 MED ORDER — SORBITOL 70 % SOLN: 30.0000 mL | Freq: Every day | Status: DC | PRN

## 2023-08-14 MED ORDER — LACTULOSE 10 GM/15ML PO SOLN
30.0000 g | Freq: Every day | ORAL | Status: DC | PRN
  Administered 2023-08-14: 30 g via ORAL
  Filled 2023-08-14: qty 60

## 2023-08-14 MED ORDER — MAGNESIUM CITRATE PO SOLN
1.0000 | Freq: Every day | ORAL | Status: DC | PRN
  Administered 2023-08-15: 1 via ORAL
  Filled 2023-08-14: qty 296

## 2023-08-14 NOTE — Progress Notes (Signed)
Occupational Therapy Treatment Patient Details Name: Tanner Baker MRN: [id number] DOB: 25-Sep-1950 Today's Date: 08/14/2023   History of present illness Keiandre Grumet is a 72 y.o. male with medical history significant for chronic pain syndrome on chronic opiates, hypertension, depression, migraine headaches, BPH, HTN, insomnia, history of fall while stepping off his truck 10/2020 resulting in femur fracture s/p ORIF, seen in the ED on 11/18 for rib fracture sustained from a fall off his attic stairs who now presents with tailbone pain.  He states that since his fall he slid off the bed hitting his back.  Since the fall he has been feeling very weak and has not been eating.  He denies radiating pain down his legs, saddle anesthesia or urinary or bowel problems.  Head CT nonacute CT chest abdomen and pelvis showing fractures of left 2-10th ribs with trace left pleural effusion, healing pubic symphysis and pubic rami fractures extensive hematoma throughout left chest not previously seen.   OT comments  Pt is supine in bed on arrival. Pleasant and agreeable to OT session. He reports 9/10 pain to his sacral area from fx. He had just transferred to [facility] with nursing to attempt to have BM. Agreeable to bedside bath initially, then realized he could not tolerate pressure on his buttocks without UE support on bed, therefore agreed to stand at [facility]. Min A for STS from EOB to RW with posterior lean on first attempt. Able to stand with unilateral support on RW to bathe BUEs, buttocks/peri-area with CGA. Lateral steps taken to [facility] using RW with CGA before returning to seated position. CGA for return to supine and use of trapeze bar for repositioning. LB bathing performed with Mod A at bed level d/t pain. Pt rolled with Min A and donut placed under bottom with pt reporting it felt better. Pt left with all needs in place and will cont to require skilled acute OT services to maximize his safety and IND to return to PLOF.        If plan is discharge home, recommend the following:  Assistance with cooking/housework;Assist for transportation;Help with stairs or ramp for entrance;A lot of help with bathing/dressing/bathroom;A lot of help with walking and/or transfers   Equipment Recommendations  BSC/3in1 (RW)    Recommendations for Other Services      Precautions / Restrictions Precautions Precaution Comments: pubic symphysis and pubic rami fractures Restrictions Weight Bearing Restrictions Per Provider Order: Yes RLE Weight Bearing Per Provider Order: Weight bearing as tolerated LLE Weight Bearing Per Provider Order: Weight bearing as tolerated       Mobility Bed Mobility Overal bed mobility: Needs Assistance Bed Mobility: Rolling, Supine to Sit, Sit to Supine Rolling: Min assist, Used rails   Supine to sit: HOB elevated, Used rails, Contact guard Sit to supine: Contact guard assist, HOB elevated, Used rails   General bed mobility comments: CGA for bed mobility using trapeze bar for repositioning and Min A to roll to place donut    Transfers Overall transfer level: Needs assistance Equipment used: Rolling walker (2 wheels) Transfers: Sit to/from Stand Sit to Stand: Min assist           General transfer comment: Min A for STS from EOB to RW and CGA for lateral steps to [facility]     Balance Overall balance assessment: Needs assistance Sitting-balance support: Feet supported, Bilateral upper extremity supported Sitting balance-Leahy Scale: Fair Sitting balance - Comments: improved balance since eval, however still offloading bottom from sacral fx via BUEs; unable  to tolerate seated balance without UE support   Standing balance support: Bilateral upper extremity supported, During functional activity, Reliant on assistive device for balance Standing balance-Leahy Scale: Fair Standing balance comment: CGA and unilateral support on walker to maintain balance during hygiene                            ADL either performed or assessed with clinical judgement   ADL Overall ADL's : Needs assistance/impaired     Grooming: Wash/dry face;Set up;Sitting   Upper Body Bathing: Minimal assistance;Sitting Upper Body Bathing Details (indicate cue type and reason): d/t high pain in sitting from sacral fx Lower Body Bathing: Moderate assistance;Sit to/from stand;Bed level Lower Body Bathing Details (indicate cue type and reason): pt bathed peri-area/buttocks in standing at EOB with CGA for balance and Mod A for everywhere else d/t unable to tolerate sitting position and need to return to supine                            Extremity/Trunk Assessment              Vision       Perception     Praxis      Cognition                                                Exercises      Shoulder Instructions       General Comments high pain from sacral fx remains    Pertinent Vitals/ Pain       Pain Assessment Pain Assessment: 0-10 Pain Score: 9  Pain Location: pelvis, coccyx Pain Descriptors / Indicators: Aching, Grimacing, Moaning, Sore Pain Intervention(s): Monitored during session, Limited activity within patient's tolerance, Repositioned, Patient requesting pain meds-RN notified  Home Living                                          Prior Functioning/Environment              Frequency  Min 1X/week        Progress Toward Goals  OT Goals(current goals can now be found in the care plan section)  Progress towards OT goals: Progressing toward goals  Acute Rehab OT Goals Patient Stated Goal: improve pain and strength OT Goal Formulation: With patient Time For Goal Achievement: 08/24/23 Potential to Achieve Goals: Good  Plan      Co-evaluation                 AM-PAC OT "6 Clicks" Daily Activity     Outcome Measure   Help from another person eating meals?: None Help from another person taking  care of personal grooming?: A Little Help from another person toileting, which includes using toliet, bedpan, or urinal?: A Lot Help from another person bathing (including washing, rinsing, drying)?: A Lot Help from another person to put on and taking off regular upper body clothing?: A Lot Help from another person to put on and taking off regular lower body clothing?: A Lot 6 Click Score: 15    End of Session Equipment Utilized During Treatment: Rolling walker (2 wheels)  OT Visit Diagnosis:  Other abnormalities of gait and mobility (R26.89);Repeated falls (R29.6);Muscle weakness (generalized) (M62.81)   Activity Tolerance Patient limited by pain   Patient Left in bed;with call bell/phone within reach;with bed alarm set   Nurse Communication Mobility status        Time: 1610-9604 OT Time Calculation (min): 23 min  Charges: OT General Charges $OT Visit: 1 Visit OT Treatments $Self Care/Home Management : 23-37 mins  Heavan Francom, OTR/L  08/14/23, 3:18 PM   Colston Pyle E Pacer Dorn 08/14/2023, 3:15 PM

## 2023-08-14 NOTE — Plan of Care (Signed)

## 2023-08-14 NOTE — Plan of Care (Signed)
  Problem: Education: Goal: Knowledge of General Education information will improve Description: Including pain rating scale, medication(s)/side effects and non-pharmacologic comfort measures Outcome: Progressing   Problem: Clinical Measurements: Goal: Will remain free from infection Outcome: Progressing Goal: Diagnostic test results will improve Outcome: Progressing Goal: Respiratory complications will improve Outcome: Progressing   Problem: Nutrition: Goal: Adequate nutrition will be maintained Outcome: Progressing   Problem: Pain Management: Goal: General experience of comfort will improve Outcome: Progressing   Problem: Safety: Goal: Ability to remain free from injury will improve Outcome: Progressing

## 2023-08-14 NOTE — Progress Notes (Signed)
Progress Note   Patient: Tanner Baker WUJ:[id number] DOB: [birth year] DOA: 08/09/2023     5 DOS: the patient was seen and examined on 08/14/2023    Brief hospital course: Tanner Baker is a 72 y.o. male with medical history significant for chronic pain syndrome on chronic opiates, hypertension, depression, migraine headaches, BPH, HTN, insomnia, history of fall while stepping off his truck 10/2020 resulting in femur fracture s/p ORIF, seen in the ED on 11/18 for rib fracture sustained from a fall off his attic stairs who now presents with tailbone pain.  He states that since his fall he slid off the bed hitting his back.  At the [place], since the fall he has been feeling very weak and has not been eating.  He denies radiating pain down his legs, saddle anesthesia or urinary or bowel problems. ED course and data review: Vitals within normal limits. Labs notable for WBC of 26,000, hemoglobin 11.8, alk phos 312 and total bilirubin 1.4.  Troponin 8 and CK 51. Head CT nonacute CT chest abdomen and pelvis showing fractures of left 2 through 10th ribs with trace left pleural effusion, healing pubic symphysis and pubic rami fractures extensive hematoma throughout left chest not previously seen and possible consolidation as further detailed below:       Assessment and Plan:   Pneumonia with left pleural effusion, sequela from fall 07/20/23 Multiple rib fractures, left, 2 through 10 Chest wall hematoma Sepsis due to pneumonia - POA with leukocytosis and tachycardia, in setting of PNA.   Patient with generalized weakness, WBC 26,000 CT chest showing "Trace left pleural effusion and associated atelectasis or consolidation" --Completed course of Rocephin and azithromycin --Continue incentive spirometer --Pain control PRN & bowel regimen --Continue PT OT --Follow cultures --Avoiding anticoagulant at this time   Anemia due to acute blood loss (chest wall hematoma) Hbg 11.8 on 12/8 >> 9.0 >> 9.1 >>  8.6 >> 8.3 >> 7.9 --Trend Hbg closely --Transfuse RBC's if Hbg < 7 --Add on iron studies --Monitor hematoma sight closely for signs of active bleeding --Unlikely any indication for vascular or IR intervention unless pt becomes unstable     Pubic ramus and pubic symphysis fracture with routine healing, subsequent encounter Fall from stairs 07/20/2023, sequela --PT OT evaluations - SNF recommendedmirl --Continue pain control per orders -- schedule Tylenol 1000 mg Q8H, PRN oxycodone, PRN IV morphine breakthrough, also on home MS Contin --Bowel regimen   Generalized weakness Likely related to pneumonia and traumatic injury Continue PT OT   Chronic pain syndrome Chronic opiates Opioid-induced constipation Continue home oxycodone, MS Contin and gabapentin and Flexeril Bowel regimen - escalate as needed    Depression Insomnia Continue trazodone nightly   Migraine Continue Topamax, sumatriptan, propranolol     DVT prophylaxis: SCD    Consults: none   Advance Care Planning:   Code Status: Prior     Family Communication: family members at bedside on rounds 12/11, 12/12. None present on rounds today. No new medical updates to provide at this time.    Disposition Plan: SNF recommended, placement pending      Subjective:  Patient seen awake resting in bed this AM, family not present at the time. He reports doing well overall. Pleased he was able to ambulate some yesterday.  Still no BM.  No other complaints.       Physical Exam: General exam: awake, alert, no acute distress, chronically ill appearing HEENT: moist mucus membranes, hearing grossly normal  Respiratory system: lungs clear no  wheezes or rhonchi, normal respiratory effort at rest, on room air. Cardiovascular system: normal S1/S2, RRR, no peripheral edema.   Gastrointestinal system: soft, NT, ND Central nervous system: A&O x 3. no gross focal neurologic deficits, normal speech Extremities: moves all, no  edema, normal tone Skin: dry, intact, normal temperature Psychiatry: normal mood, congruent affect, judgement and insight appear normal        Disposition: Status is: Inpatient -   Remains inpatient appropriate due to - awaiting SNF placement.   Medically stable.    Time spent: 35 minutes     Data Reviewed: I have reviewed imaging  as well as CT scan ras noted above    Latest Ref Rng & Units 08/14/2023    4:33 AM 08/13/2023    4:37 AM 08/12/2023    4:41 AM  CBC  WBC 4.0 - 10.5 K/uL 8.6  7.2  9.1   Hemoglobin 13.0 - 17.0 g/dL 8.1  7.9  8.3   Hematocrit 39.0 - 52.0 % 25.4  25.0  25.8   Platelets 150 - 400 K/uL 197  199  183        Latest Ref Rng & Units 08/12/2023    4:41 AM 08/11/2023    5:08 AM 08/09/2023    6:50 PM  BMP  Glucose 70 - 99 mg/dL 161  096  045   BUN 8 - 23 mg/dL 21  22  22    Creatinine 0.61 - 1.24 mg/dL 4.09  8.11  9.14   Sodium 135 - 145 mmol/L 137  138  137   Potassium 3.5 - 5.1 mmol/L 3.8  3.8  5.0   Chloride 98 - 111 mmol/L 107  108  104   CO2 22 - 32 mmol/L 26  25  24    Calcium 8.9 - 10.3 mg/dL 8.0  8.0  8.6     Vitals:   08/13/23 0812 08/13/23 1515 08/13/23 2249 08/14/23 0905  BP: 125/61 129/66 (!) 122/59 118/67  Pulse: 75 80 80 73  Resp:  16 19 15   Temp: 98 F (36.7 C) 97.7 F (36.5 C) 97.7 F (36.5 C) 98.4 F (36.9 C)  TempSrc:  Oral    SpO2: 100% 100% 98% 99%  Weight:         Author: Pennie Banter, DO 08/14/2023 11:15 AM  For on call review [email or web address].

## 2023-08-14 NOTE — TOC Progression Note (Signed)
Transition of Care [facility]) - Progression Note    Patient Details  Name: Tanner Baker MRN: [id number] Date of Birth: 1951-06-12  Transition of Care [facility]) CM/SW Contact  Marlowe Sax, RN Phone Number: 08/14/2023, 11:21 AM  Clinical Narrative:      The patient is waiting to go to [facility] for STR, INS is pending and the patient will need a BM, PASSR is also pending ref number [id number]       Expected Discharge Plan and Services                                               Social Determinants of Health (SDOH) Interventions SDOH Screenings   Food Insecurity: No Food Insecurity (08/10/2023)  Housing: Low Risk  (08/10/2023)  Transportation Needs: No Transportation Needs (08/10/2023)  Utilities: Not At Risk (08/10/2023)  Depression (PHQ2-9): Low Risk  (05/13/2023)  Financial Resource Strain: Low Risk  (02/26/2023)   Received from [facility], [facility]  Tobacco Use: Medium Risk (08/04/2023)    Readmission Risk Interventions     No data to display

## 2023-08-15 DIAGNOSIS — G894 Chronic pain syndrome: Secondary | ICD-10-CM | POA: Diagnosis not present

## 2023-08-15 DIAGNOSIS — S2249XD Multiple fractures of ribs, unspecified side, subsequent encounter for fracture with routine healing: Secondary | ICD-10-CM | POA: Diagnosis not present

## 2023-08-15 LAB — CBC
HCT: 27.1 % — ABNORMAL LOW (ref 39.0–52.0)
Hemoglobin: 8.7 g/dL — ABNORMAL LOW (ref 13.0–17.0)
MCH: 31.2 pg (ref 26.0–34.0)
MCHC: 32.1 g/dL (ref 30.0–36.0)
MCV: 97.1 fL (ref 80.0–100.0)
Platelets: 221 10*3/uL (ref 150–400)
RBC: 2.79 MIL/uL — ABNORMAL LOW (ref 4.22–5.81)
RDW: 14.1 % (ref 11.5–15.5)
WBC: 10 10*3/uL (ref 4.0–10.5)
nRBC: 0 % (ref 0.0–0.2)

## 2023-08-15 LAB — GLUCOSE, CAPILLARY: Glucose-Capillary: 106 mg/dL — ABNORMAL HIGH (ref 70–99)

## 2023-08-15 MED ORDER — SMOG ENEMA
300.0000 mL | Freq: Once | RECTAL | Status: DC | PRN
  Filled 2023-08-15 (×2): qty 960

## 2023-08-15 MED ORDER — ACETAMINOPHEN 500 MG PO TABS
1000.0000 mg | ORAL_TABLET | Freq: Three times a day (TID) | ORAL | Status: DC
Start: 2023-08-15 — End: 2023-08-17
  Administered 2023-08-15 – 2023-08-17 (×7): 1000 mg via ORAL
  Filled 2023-08-15 (×7): qty 2

## 2023-08-15 NOTE — Progress Notes (Signed)
PT Cancellation Note  Patient Details Name: Tanner Baker MRN: [id number] DOB: [birth year]   Cancelled Treatment:     PT attempt. Pt sleeping soundly. He does awake but refused PT at this time. "I'm tired. Can you come later? " Per chart, pt is Dcing to [facility] on Monday.    Rushie Chestnut 08/15/2023, 2:45 PM

## 2023-08-15 NOTE — Plan of Care (Signed)
  Problem: Education: Goal: Knowledge of General Education information will improve Description Including pain rating scale, medication(s)/side effects and non-pharmacologic comfort measures Outcome: Progressing   

## 2023-08-15 NOTE — TOC Progression Note (Addendum)
Transition of Care [facility]) - Progression Note    Patient Details  Name: Walley Marsh MRN: [id number] Date of Birth: 14-Jan-1951  Transition of Care [facility]) CM/SW Contact  Liliana Cline, LCSW Phone Number: 08/15/2023, 9:36 AM  Clinical Narrative:    [facility] approved for SNF. PASRR is back in [place] Must [phone] [id number].  Asked Tammy at [facility] if patient can come today.  11:20- Gena at [facility] states patient can come today if she gets DC Summary as soon as possible.  12:20- DO has ordered an enema as patient has not had a bowel movement. Per Gena at [facility], will need to wait until Monday due to their Pharmacy closing early on Saturdays. Monday, patient will go to Room 804.   Expected Discharge Plan: Skilled Nursing Facility Barriers to Discharge: English as a second language teacher, Awaiting State Approval Financial controller)  Expected Discharge Plan and Services   Discharge Planning Services: CM Consult   Living arrangements for the past 2 months: Single Family Home                                       Social Determinants of Health (SDOH) Interventions SDOH Screenings   Food Insecurity: No Food Insecurity (08/10/2023)  Housing: Low Risk  (08/10/2023)  Transportation Needs: No Transportation Needs (08/10/2023)  Utilities: Not At Risk (08/10/2023)  Depression (PHQ2-9): Low Risk  (05/13/2023)  Financial Resource Strain: Low Risk  (02/26/2023)   Received from [facility], [facility]  Tobacco Use: Medium Risk (08/04/2023)    Readmission Risk Interventions     No data to display

## 2023-08-15 NOTE — Progress Notes (Signed)
Progress Note   Patient: Tanner Baker ZOX:[id number] DOB: 1950-09-21 DOA: 08/09/2023     6 DOS: the patient was seen and examined on 08/15/2023    Brief hospital course: "Lavonte Lu is a 72 y.o. male with medical history significant for chronic pain syndrome on chronic opiates, hypertension, depression, migraine headaches, BPH, HTN, insomnia, history of fall while stepping off his truck 10/2020 resulting in femur fracture s/p ORIF, seen in the ED on 11/18 for rib fracture sustained from a fall off his attic stairs who now presents with tailbone pain.  He states that since his fall he slid off the bed hitting his back.  At the [place], since the fall he has been feeling very weak and has not been eating.  He denies radiating pain down his legs, saddle anesthesia or urinary or bowel problems. ED course and data review: Vitals within normal limits. Labs notable for WBC of 26,000, hemoglobin 11.8, alk phos 312 and total bilirubin 1.4.  Troponin 8 and CK 51. Head CT nonacute CT chest abdomen and pelvis showing fractures of left 2 through 10th ribs with trace left pleural effusion, healing pubic symphysis and pubic rami fractures extensive hematoma throughout left chest not previously seen and possible consolidation as further detailed below:"    Further hospital course and management as outlined below.      Assessment and Plan:   Pneumonia with left pleural effusion, sequela from fall 07/20/23 Multiple rib fractures, left, 2 through 10 Chest wall hematoma Sepsis due to pneumonia - POA with leukocytosis and tachycardia, in setting of PNA.   Patient with generalized weakness, WBC 26,000 CT chest showing "Trace left pleural effusion and associated atelectasis or consolidation" --Completed course of Rocephin and azithromycin --Continue incentive spirometer --Pain control PRN & bowel regimen --Continue PT OT --Follow cultures --Avoiding anticoagulant at this time   Anemia due to acute  blood loss (chest wall hematoma) Hbg 11.8 on 12/8 >> 9.0 >> 9.1 >> 8.6 >> 8.3 >> 7.9 >> 7.1 >> 8.7 --Trend Hbg closely --Transfuse RBC's if Hbg < 7 --Add on iron studies --Monitor hematoma sight closely for signs of active bleeding --Unlikely any indication for vascular or IR intervention unless pt becomes unstable     Pubic ramus and pubic symphysis fracture with routine healing, subsequent encounter Fall from stairs 07/20/2023, sequela --PT OT evaluations - SNF recommendedmirl --Continue pain control per orders -- schedule Tylenol 1000 mg Q8H, PRN oxycodone, PRN IV morphine breakthrough, also on home MS Contin --Bowel regimen   Generalized weakness Likely related to pneumonia and traumatic injury Continue PT OT   Chronic pain syndrome Chronic opiates Opioid-induced constipation Continue home oxycodone, MS Contin and gabapentin and Flexeril Bowel regimen - escalate as needed  SMOG enema today if no result with mag citrate this AM   Depression Insomnia Continue trazodone nightly   Migraine Continue Topamax, sumatriptan, propranolol     DVT prophylaxis: SCD    Consults: none   Advance Care Planning:   Code Status: Prior     Family Communication: family members at bedside on rounds 12/11, 12/12. None present on rounds today. No new medical updates to provide at this time.    Disposition Plan: SNF recommended, placement pending.  Per TOC, facility cannot admit pt until Monday 12/16.      Subjective:  Patient seen awake resting in bed this AM. Still has not had any BM, drinking Mag citrate.  Denies significant pain or other acute complaints.      Physical  Exam: General exam: awake, alert, no acute distress HEENT: moist mucus membranes, hearing grossly normal  Respiratory system: CTAB no wheezes, rales or rhonchi, normal respiratory effort. Cardiovascular system: normal S1/S2, RRR,  no pedal edema.   Gastrointestinal system: soft, NT, ND Central nervous  system: A&O x 3. no gross focal neurologic deficits, normal speech Extremities: moves all, no edema, normal tone Skin: dry, intact, normal temperature Psychiatry: normal mood, congruent affect, judgement and insight appear normal         Disposition: Status is: Inpatient -   Remains inpatient appropriate due to - awaiting SNF placement.   Medically stable.    Time spent: 35 minutes     Data Reviewed: I have reviewed imaging  as well as CT scan ras noted above    Latest Ref Rng & Units 08/15/2023    3:15 AM 08/14/2023    4:33 AM 08/13/2023    4:37 AM  CBC  WBC 4.0 - 10.5 K/uL 10.0  8.6  7.2   Hemoglobin 13.0 - 17.0 g/dL 8.7  8.1  7.9   Hematocrit 39.0 - 52.0 % 27.1  25.4  25.0   Platelets 150 - 400 K/uL 221  197  199        Latest Ref Rng & Units 08/12/2023    4:41 AM 08/11/2023    5:08 AM 08/09/2023    6:50 PM  BMP  Glucose 70 - 99 mg/dL 161  096  045   BUN 8 - 23 mg/dL 21  22  22    Creatinine 0.61 - 1.24 mg/dL 4.09  8.11  9.14   Sodium 135 - 145 mmol/L 137  138  137   Potassium 3.5 - 5.1 mmol/L 3.8  3.8  5.0   Chloride 98 - 111 mmol/L 107  108  104   CO2 22 - 32 mmol/L 26  25  24    Calcium 8.9 - 10.3 mg/dL 8.0  8.0  8.6     Vitals:   08/14/23 0905 08/14/23 1524 08/14/23 2354 08/15/23 0735  BP: 118/67 121/68 126/65 116/66  Pulse: 73 81 80 80  Resp: 15 16 20 16   Temp: 98.4 F (36.9 C) 98.4 F (36.9 C) 98.2 F (36.8 C) 98 F (36.7 C)  TempSrc:      SpO2: 99% 96% 94% 96%  Weight:         Author: Pennie Banter, DO 08/15/2023 12:44 PM  For on call review [email or web address].

## 2023-08-16 DIAGNOSIS — S2249XD Multiple fractures of ribs, unspecified side, subsequent encounter for fracture with routine healing: Secondary | ICD-10-CM | POA: Diagnosis not present

## 2023-08-16 DIAGNOSIS — R531 Weakness: Secondary | ICD-10-CM

## 2023-08-16 DIAGNOSIS — G894 Chronic pain syndrome: Secondary | ICD-10-CM | POA: Diagnosis not present

## 2023-08-16 DIAGNOSIS — S20212A Contusion of left front wall of thorax, initial encounter: Secondary | ICD-10-CM | POA: Diagnosis not present

## 2023-08-16 MED ORDER — ALIGN 4 MG PO CAPS
1.0000 | ORAL_CAPSULE | Freq: Every day | ORAL | Status: DC
  Administered 2023-08-17: 4 mg via ORAL
  Filled 2023-08-16 (×2): qty 1

## 2023-08-16 MED ORDER — MORPHINE SULFATE ER 30 MG PO TBCR
30.0000 mg | EXTENDED_RELEASE_TABLET | Freq: Three times a day (TID) | ORAL | Status: DC
  Administered 2023-08-16 – 2023-08-17 (×3): 30 mg via ORAL
  Filled 2023-08-16 (×4): qty 1

## 2023-08-16 NOTE — Plan of Care (Signed)
  Problem: Education: Goal: Knowledge of General Education information will improve Description: Including pain rating scale, medication(s)/side effects and non-pharmacologic comfort measures Outcome: Progressing   Problem: Health Behavior/Discharge Planning: Goal: Ability to manage health-related needs will improve Outcome: Progressing   Problem: Clinical Measurements: Goal: Will remain free from infection Outcome: Progressing Goal: Respiratory complications will improve Outcome: Progressing   Problem: Activity: Goal: Risk for activity intolerance will decrease Outcome: Progressing   Problem: Nutrition: Goal: Adequate nutrition will be maintained Outcome: Progressing   Problem: Elimination: Goal: Will not experience complications related to bowel motility Outcome: Progressing   Problem: Pain Management: Goal: General experience of comfort will improve Outcome: Progressing

## 2023-08-16 NOTE — Plan of Care (Signed)

## 2023-08-16 NOTE — Progress Notes (Signed)
Progress Note   Patient: Tanner Baker ZHY:[id number] DOB: 02/26/1951 DOA: 08/09/2023     7 DOS: the patient was seen and examined on 08/16/2023    Brief hospital course: "Tanner Baker is a 72 y.o. male with medical history significant for chronic pain syndrome on chronic opiates, hypertension, depression, migraine headaches, BPH, HTN, insomnia, history of fall while stepping off his truck 10/2020 resulting in femur fracture s/p ORIF, seen in the ED on 11/18 for rib fracture sustained from a fall off his attic stairs who now presents with tailbone pain.  He states that since his fall he slid off the bed hitting his back.  At the [place], since the fall he has been feeling very weak and has not been eating.  He denies radiating pain down his legs, saddle anesthesia or urinary or bowel problems. ED course and data review: Vitals within normal limits. Labs notable for WBC of 26,000, hemoglobin 11.8, alk phos 312 and total bilirubin 1.4.  Troponin 8 and CK 51. Head CT nonacute CT chest abdomen and pelvis showing fractures of left 2 through 10th ribs with trace left pleural effusion, healing pubic symphysis and pubic rami fractures extensive hematoma throughout left chest not previously seen and possible consolidation as further detailed below:"    Further hospital course and management as outlined below.      Assessment and Plan:   Pneumonia with left pleural effusion, sequela from fall 07/20/23 Multiple rib fractures, left, 2 through 10 Chest wall hematoma Sepsis due to pneumonia - POA with leukocytosis and tachycardia, in setting of PNA.   Patient with generalized weakness, WBC 26,000 CT chest showing "Trace left pleural effusion and associated atelectasis or consolidation" --Completed course of Rocephin and azithromycin --Continue incentive spirometer --Pain control PRN & bowel regimen --Continue PT OT --Follow cultures --SCD's for DVT ppx, avoid anticoagulants   Anemia due to acute  blood loss (chest wall hematoma) Hbg 11.8 on 12/8 >> 9.0 >> 9.1 >> 8.6 >> 8.3 >> 7.9 >> 7.1 >> 8.7 --Monitor CBC --Transfuse RBC's if Hbg < 7 --Monitor hematoma sight closely for signs of active bleeding     Pubic ramus and pubic symphysis fracture with routine healing, subsequent encounter Fall from stairs 07/20/2023, sequela --PT OT evaluations - SNF recommendedmirl --Continue pain control per orders -- schedule Tylenol 1000 mg Q8H, PRN oxycodone, PRN IV morphine breakthrough, also on home MS Contin --Bowel regimen   Generalized weakness Likely related to pneumonia and traumatic injury Continue PT OT   Chronic pain syndrome Chronic opiates Opioid-induced constipation Continue home oxycodone, MS Contin and gabapentin and Flexeril Bowel regimen - escalate as needed  SMOG enema today if no result with mag citrate this AM   Depression Insomnia Continue trazodone nightly   Migraine Continue Topamax, sumatriptan, propranolol     DVT prophylaxis: SCD    Consults: none   Advance Care Planning:   Code Status: Prior     Family Communication: family members at bedside on rounds 12/11, 12/12. None present on rounds today. No new medical updates to provide at this time.    Disposition Plan: SNF placement pending.  Per TOC, facility cannot admit pt until Monday 12/16.      Subjective:  Patient was sleeping but woke to voice this AM.  He denies complaints.  Finally had a BM yesterday later afternoon.  Eager to get to rehab.     Physical Exam: General exam: sleeping, woke to voice, no acute distress HEENT: moist mucus membranes, hearing grossly  normal  Respiratory system: lungs clear, on room air, normal respiratory effort. Cardiovascular system: normal S1/S2, RRR,  no pedal edema.   Gastrointestinal system: soft, NT, ND Central nervous system: A&O x 3. no gross focal neurologic deficits, normal speech Extremities: moves all, no edema, normal tone Skin: dry, intact,  normal temperature Psychiatry: normal mood, congruent affect, judgement and insight appear normal         Disposition: Status is: Inpatient -   Remains inpatient appropriate due to - awaiting SNF placement.   Medically stable.    Time spent: 35 minutes     Data Reviewed: I have reviewed imaging  as well as CT scan ras noted above    Latest Ref Rng & Units 08/15/2023    3:15 AM 08/14/2023    4:33 AM 08/13/2023    4:37 AM  CBC  WBC 4.0 - 10.5 K/uL 10.0  8.6  7.2   Hemoglobin 13.0 - 17.0 g/dL 8.7  8.1  7.9   Hematocrit 39.0 - 52.0 % 27.1  25.4  25.0   Platelets 150 - 400 K/uL 221  197  199        Latest Ref Rng & Units 08/12/2023    4:41 AM 08/11/2023    5:08 AM 08/09/2023    6:50 PM  BMP  Glucose 70 - 99 mg/dL 161  096  045   BUN 8 - 23 mg/dL 21  22  22    Creatinine 0.61 - 1.24 mg/dL 4.09  8.11  9.14   Sodium 135 - 145 mmol/L 137  138  137   Potassium 3.5 - 5.1 mmol/L 3.8  3.8  5.0   Chloride 98 - 111 mmol/L 107  108  104   CO2 22 - 32 mmol/L 26  25  24    Calcium 8.9 - 10.3 mg/dL 8.0  8.0  8.6     Vitals:   08/15/23 0735 08/15/23 1603 08/15/23 2117 08/16/23 0835  BP: 116/66 117/70 122/69 108/70  Pulse: 80 82 86 77  Resp: 16 16 16 18   Temp: 98 F (36.7 C) 98 F (36.7 C) 98 F (36.7 C) 98 F (36.7 C)  TempSrc:      SpO2: 96% 97% 97% 95%  Weight:         Author: Pennie Banter, DO 08/16/2023 10:29 AM  For on call review [email or web address].

## 2023-08-17 DIAGNOSIS — I10 Essential (primary) hypertension: Secondary | ICD-10-CM | POA: Diagnosis not present

## 2023-08-17 DIAGNOSIS — S32509D Unspecified fracture of unspecified pubis, subsequent encounter for fracture with routine healing: Secondary | ICD-10-CM | POA: Diagnosis not present

## 2023-08-17 DIAGNOSIS — G894 Chronic pain syndrome: Secondary | ICD-10-CM | POA: Diagnosis not present

## 2023-08-17 DIAGNOSIS — J189 Pneumonia, unspecified organism: Secondary | ICD-10-CM | POA: Diagnosis not present

## 2023-08-17 DIAGNOSIS — Z7401 Bed confinement status: Secondary | ICD-10-CM | POA: Diagnosis not present

## 2023-08-17 DIAGNOSIS — R531 Weakness: Secondary | ICD-10-CM | POA: Diagnosis not present

## 2023-08-17 LAB — BASIC METABOLIC PANEL
Anion gap: 8 (ref 5–15)
BUN: 23 mg/dL (ref 8–23)
CO2: 27 mmol/L (ref 22–32)
Calcium: 8.7 mg/dL — ABNORMAL LOW (ref 8.9–10.3)
Chloride: 104 mmol/L (ref 98–111)
Creatinine, Ser: 0.89 mg/dL (ref 0.61–1.24)
GFR, Estimated: >60 mL/min (ref >60)
Glucose, Bld: 112 mg/dL — ABNORMAL HIGH (ref 70–99)
Potassium: 4.2 mmol/L (ref 3.5–5.1)
Sodium: 139 mmol/L (ref 135–145)

## 2023-08-17 LAB — CBC
HCT: 31.4 % — ABNORMAL LOW (ref 39.0–52.0)
Hemoglobin: 10 g/dL — ABNORMAL LOW (ref 13.0–17.0)
MCH: 31.1 pg (ref 26.0–34.0)
MCHC: 31.8 g/dL (ref 30.0–36.0)
MCV: 97.5 fL (ref 80.0–100.0)
Platelets: 263 10*3/uL (ref 150–400)
RBC: 3.22 MIL/uL — ABNORMAL LOW (ref 4.22–5.81)
RDW: 14 % (ref 11.5–15.5)
WBC: 10.9 10*3/uL — ABNORMAL HIGH (ref 4.0–10.5)
nRBC: 0 % (ref 0.0–0.2)

## 2023-08-17 LAB — VITAMIN D 25 HYDROXY (VIT D DEFICIENCY, FRACTURES): Vit D, 25-Hydroxy: 60.76 ng/mL (ref 30–100)

## 2023-08-17 LAB — PHOSPHORUS: Phosphorus: 3.4 mg/dL (ref 2.5–4.6)

## 2023-08-17 LAB — MAGNESIUM: Magnesium: 2.2 mg/dL (ref 1.7–2.4)

## 2023-08-17 LAB — VITAMIN B12: Vitamin B-12: 5387 pg/mL — ABNORMAL HIGH (ref 180–914)

## 2023-08-17 MED ORDER — POLYSACCHARIDE IRON COMPLEX 150 MG PO CAPS
150.0000 mg | ORAL_CAPSULE | Freq: Every day | ORAL | Status: DC
  Administered 2023-08-17: 150 mg via ORAL
  Filled 2023-08-17: qty 1

## 2023-08-17 MED ORDER — POLYETHYLENE GLYCOL 3350 17 G PO PACK: 17.0000 g | PACK | Freq: Two times a day (BID) | ORAL | Status: AC

## 2023-08-17 MED ORDER — MORPHINE SULFATE ER 30 MG PO TBCR: 30.0000 mg | EXTENDED_RELEASE_TABLET | Freq: Three times a day (TID) | ORAL | 0 refills | Status: DC

## 2023-08-17 MED ORDER — BISACODYL 5 MG PO TBEC
10.0000 mg | DELAYED_RELEASE_TABLET | Freq: Every day | ORAL | Status: AC
Start: 2023-08-17 — End: ?

## 2023-08-17 MED ORDER — ASCORBIC ACID 500 MG PO TABS: 500.0000 mg | ORAL_TABLET | Freq: Every day | ORAL | Status: AC

## 2023-08-17 MED ORDER — ACETAMINOPHEN 325 MG PO TABS: 650.0000 mg | ORAL_TABLET | Freq: Four times a day (QID) | ORAL | Status: AC | PRN

## 2023-08-17 MED ORDER — POLYSACCHARIDE IRON COMPLEX 150 MG PO CAPS: 150.0000 mg | ORAL_CAPSULE | Freq: Every day | ORAL | Status: AC

## 2023-08-17 MED ORDER — VITAMIN C 500 MG PO TABS
500.0000 mg | ORAL_TABLET | Freq: Every day | ORAL | Status: DC
  Administered 2023-08-17: 500 mg via ORAL
  Filled 2023-08-17 (×2): qty 1

## 2023-08-17 NOTE — TOC Transition Note (Signed)
Transition of Care [facility]) - Discharge Note   Patient Details  Name: Tanner Baker MRN: [id number] Date of Birth: 1951-02-16  Transition of Care [facility]) CM/SW Contact:  Garret Reddish, RN Phone Number: 08/17/2023, 1:10 PM   Clinical Narrative:    Chart reviewed.  Noted that patient has orders for discharge.    I have spoken with Almira Coaster, Admission Coordinator with [facility].  She informs me that [facility] will have a bed for the patient today. Almira Coaster reports that number to call report is [phone].  Patient will be going to room 804.  I have sent [facility] patient's SNF Transfer Report, Discharge Summary, and Discharge orders via Epic hub.    I LVM for patient's wife Mrs. Dov Basciano that patient will be a discharge for today to [facility].    I have arranged EMS transport via [facility] to transport patient to [facility].    I have informed staff nurse of the above information.      Final next level of care: Skilled Nursing Facility Barriers to Discharge: No Barriers Identified   Patient Goals and CMS Choice   CMS Medicare.gov Compare Post Acute Care list provided to:: Patient Choice offered to / list presented to : Patient      Discharge Placement              Patient chooses bed at: [facility] [place] Patient to be transferred to facility by: [facility] Name of family member notified: Jerrit Whittemore ( patient's spouse) Patient and family notified of of transfer: 08/17/23  Discharge Plan and Services Additional resources added to the After Visit Summary for     Discharge Planning Services: CM Consult                                 Social Drivers of Health (SDOH) Interventions SDOH Screenings   Food Insecurity: No Food Insecurity (08/10/2023)  Housing: Low Risk  (08/10/2023)  Transportation Needs: No Transportation Needs (08/10/2023)  Utilities: Not At Risk (08/10/2023)  Depression (PHQ2-9): Low Risk  (05/13/2023)   Financial Resource Strain: Low Risk  (02/26/2023)   Received from [facility]  Tobacco Use: Medium Risk (08/04/2023)     Readmission Risk Interventions     No data to display

## 2023-08-17 NOTE — Progress Notes (Signed)
Called report to RN Angie at [facility].

## 2023-08-17 NOTE — Progress Notes (Signed)
Patient discharged to [facility] via EMS. PIV x1 removed, no bleeding, intact. Patient discharged with all belongings.

## 2023-08-17 NOTE — Consult Note (Signed)
[facility] Liaison Note  08/17/2023  Tanner Baker 07-04-1951 [id number]  Location: RN Hospital Liaison screened the patient remotely at [facility].  Insurance: [facility] HMO   Tanner Baker is a 72 y.o. male who is a Primary Care Patient of Lauro Regulus, MD The patient was screened for readmission hospitalization with noted medium risk score for unplanned readmission risk with 1 IP/1 ED in 6 months.  The patient was assessed for potential Care Management service needs for post hospital transition for care coordination. Review of patient's electronic medical record reveals patient was admitted for Pneumonia with left pleural effusion. Pt recommended for SNF level of care and will transition to [facility] for ongoing STR. Facility will continue to address pt's ongoing needs.   [facility] Care Management/Population Health does not replace or interfere with any arrangements made by the Inpatient Transition of Care team.   For questions contact:   Elliot Cousin, RN, [facility] Liaison [place]   [facility], Population Health Office Hours MTWF  8:00 am-6:00 pm Direct Dial: [phone] mobile [phone] [Office toll free line] Office Hours are M-F 8:30 - 5 pm [email or web address]

## 2023-08-17 NOTE — Progress Notes (Signed)
Attempted to call [facility] to give report.  Not able to do so at this time due to no one answering phone.  Will attempt to call again to give report.

## 2023-08-17 NOTE — Discharge Summary (Signed)
[facility] Discharge Summary   Patient: Tanner Baker ION:[id number]  PCP: Lauro Regulus, MD  Date of admission: 08/09/2023   Date of discharge:  08/17/2023     Discharge Diagnoses:  Principal Problem:   Pneumonia with left pleural effusion, sequela from fall 07/20/23 Active Problems:   Multiple rib fractures, left 2 through 10, with chest wall hematoma   Pleural effusion on left   Chest wall hematoma, left, initial encounter   Pubic ramus and pubic symphysis fracture with routine healing, subsequent encounter   Generalized weakness   Fall (on) (from) other stairs and steps(07/20/23), sequela   Chronic pain syndrome   Chronic, continuous use of opioids   Depression   Migraine   Admitted From: Home Disposition:  SNF   Recommendations for Outpatient Follow-up:  Follow-up with PCP, patient should be seen by an MD in 1 to 2 days, continue as needed medication for pain control and continue fall precaution.  Continue laxatives.  Repeat CBC after 1 week Follow up LABS/TEST:     Contact information for after-discharge care     Destination     [facility] Preferred SNF .   Service: Skilled Nursing Contact information: [address] [place] [address] [phone]                    Diet recommendation: Regular diet  Activity: The patient is advised to gradually reintroduce usual activities, as tolerated  Discharge Condition: stable  Code Status: Full code   History of present illness: As per the H and P dictated on admission Hospital Course:  Tanner Baker is a 72 y.o. male with medical history significant for chronic pain syndrome on chronic opiates, hypertension, depression, migraine headaches, BPH, HTN, insomnia, history of fall while stepping off his truck 10/2020 resulting in femur fracture s/p ORIF, seen in the ED on 11/18 for rib fracture sustained from a fall off his attic stairs who now presents with  tailbone pain.  He states that since his fall he slid off the bed hitting his back.  At the [place], since the fall he has been feeling very weak and has not been eating.  He denies radiating pain down his legs, saddle anesthesia or urinary or bowel problems. ED course and data review: Vitals within normal limits. Labs notable for WBC of 26,000, hemoglobin 11.8, alk phos 312 and total bilirubin 1.4.  Troponin 8 and CK 51. Head CT nonacute CT chest abdomen and pelvis showing fractures of left 2 through 10th ribs with trace left pleural effusion, healing pubic symphysis and pubic rami fractures extensive hematoma throughout left chest not previously seen and possible consolidation as further detailed below:"    Further hospital course and management as outlined below.  Assessment and Plan:   # Pneumonia with left pleural effusion, sequela from fall 07/20/23 Multiple rib fractures, left, 2 through 10. Chest wall hematoma Sepsis due to pneumonia - POA with leukocytosis and tachycardia, in setting of PNA.   Patient with generalized weakness, WBC 26,000 CT chest showing "Trace left pleural effusion and associated atelectasis or consolidation" Completed course of Rocephin and azithromycin.  Blood culture NGTD.  Continue incentive spirometer. Pain control PRN & bowel regimen  # Anemia due to acute blood loss (chest wall hematoma) Hbg 11.8 on 12/8 >> 9.0 >> 9.1 >> 8.6 >> 8.3 >> 7.9 >> 7.1 >> 8.7--10.0 today stable  # Pubic ramus and pubic symphysis fracture with routine healing, subsequent encounter Fall  from stairs 07/20/2023, sequela PT OT evaluations - SNF recommendedmirl Continue pain control per orders -- schedule Tylenol 1000 mg Q8H, PRN oxycodone, PRN IV morphine breakthrough, also on home MS Contin. Bowel regimen   # Generalized weakness: Likely related to pneumonia and traumatic injury Continue PT OT # Chronic pain syndrome, Chronic opiates dependent # Opioid-induced constipation Continue  home oxycodone, MS Contin and gabapentin and Flexeril Bowel regimen - escalate as needed.  Use enema as needed # Depression and Insomnia: Continue trazodone nightly # Migraine: Continue Topamax, sumatriptan.discontinued propranolol due to soft blood pressure. BP and titrate medication accordingly.  Body mass index is 24.19 kg/m.  Nutrition Interventions:  Pain control  - [place] Controlled Substance Reporting System database could not be reviewed as website was not working. -10 tablets MS OxyContin prescription printed for rehab. - Patient was instructed, not to drive, operate heavy machinery, perform activities at heights, swimming or participation in water activities or provide baby sitting services while on Pain, Sleep and Anxiety Medications; until his outpatient Physician has advised to do so again.  - Also recommended to not to take more than prescribed Pain, Sleep and Anxiety Medications.  Patient was seen by physical therapy, who recommended Therapy, SNF placement, which was arranged. On the day of the discharge the patient's vitals were stable, and no other acute medical condition were reported by patient. the patient was felt safe to be discharge at [facility].  Consultants: None Procedures: None  Discharge Exam: General: Appear in no distress, no Rash; Oral Mucosa Clear, moist. Cardiovascular: S1 and S2 Present, no Murmur, Respiratory: normal respiratory effort, Bilateral Air entry present and no Crackles, no wheezes Abdomen: Bowel Sound present, Soft and no tenderness, no hernia Extremities: no Pedal edema, no calf tenderness Neurology: alert and oriented to time, place, and person affect appropriate.  Filed Weights   08/09/23 1732  Weight: 74.3 kg   Vitals:   08/16/23 2150 08/17/23 0747  BP: 112/73 122/70  Pulse: (!) 105 81  Resp: 18 18  Temp: 98.6 F (37 C) 98 F (36.7 C)  SpO2: 96% 97%    DISCHARGE MEDICATION: Allergies as of 08/17/2023       Reactions    Doxycycline Rash        Medication List     STOP taking these medications    lidocaine 5 % Commonly known as: Lidoderm   oxyCODONE-acetaminophen 5-325 MG tablet Commonly known as: Percocet   propranolol 20 MG tablet Commonly known as: INDERAL       TAKE these medications    acetaminophen 325 MG tablet Commonly known as: TYLENOL Take 2 tablets (650 mg total) by mouth every 6 (six) hours as needed for mild pain (pain score 1-3), moderate pain (pain score 4-6), fever or headache.   ascorbic acid 500 MG tablet Commonly known as: VITAMIN C Take 1 tablet (500 mg total) by mouth daily. Start taking on: August 18, 2023   bisacodyl 5 MG EC tablet Commonly known as: Dulcolax Take 2 tablets (10 mg total) by mouth at bedtime. What changed:  when to take this reasons to take this   cyclobenzaprine 10 MG tablet Commonly known as: FLEXERIL Take 1 tablet (10 mg total) by mouth at bedtime. What changed:  when to take this reasons to take this   diclofenac Sodium 1 % Gel Commonly known as: VOLTAREN Apply topically.   famotidine 20 MG tablet Commonly known as: PEPCID Take 20 mg by mouth 2 (two) times daily.  fluticasone 50 MCG/ACT nasal spray Commonly known as: FLONASE Place 1 spray into both nostrils daily as needed. Reported on 03/21/2016   gabapentin 100 MG capsule Commonly known as: NEURONTIN Take 1 capsule (100 mg total) by mouth 3 (three) times daily.   iron polysaccharides 150 MG capsule Commonly known as: NIFEREX Take 1 capsule (150 mg total) by mouth daily. Start taking on: August 18, 2023   linaclotide 72 MCG capsule Commonly known as: Linzess Take 1 capsule (72 mcg total) by mouth daily before breakfast.   morphine 30 MG 12 hr tablet Commonly known as: MS Contin Take 1 tablet (30 mg total) by mouth 3 (three) times daily. What changed: Another medication with the same name was removed. Continue taking this medication, and follow the directions  you see here.   multivitamins with iron Tabs tablet Take 1 tablet by mouth daily.   naloxone 4 MG/0.1ML Liqd nasal spray kit Commonly known as: NARCAN SMARTSIG:Both Nares   ondansetron 4 MG tablet Commonly known as: ZOFRAN Take 1 tablet by mouth every 6 (six) hours as needed.   PARoxetine 30 MG tablet Commonly known as: PAXIL Take 1 tablet by mouth daily.   polyethylene glycol 17 g packet Commonly known as: MIRALAX / GLYCOLAX Take 17 g by mouth 2 (two) times daily.   SUMAtriptan 100 MG tablet Commonly known as: IMITREX Take 100 mg by mouth once. May repeat in 2 hours if headache persists or recurs.   tamsulosin 0.4 MG Caps capsule Commonly known as: Flomax Take 1 capsule (0.4 mg total) by mouth daily.   topiramate 50 MG tablet Commonly known as: TOPAMAX Take 50 mg by mouth 2 (two) times daily.   traZODone 50 MG tablet Commonly known as: DESYREL Take 100 mg by mouth at bedtime as needed for sleep. Take 1-2 tablets at bedtime as needed       Allergies  Allergen Reactions   Doxycycline Rash   Discharge Instructions     Call MD for:  difficulty breathing, headache or visual disturbances   Complete by: As directed    Call MD for:  extreme fatigue   Complete by: As directed    Call MD for:  persistant dizziness or light-headedness   Complete by: As directed    Call MD for:  persistant nausea and vomiting   Complete by: As directed    Call MD for:  severe uncontrolled pain   Complete by: As directed    Call MD for:  temperature >100.4   Complete by: As directed    Diet general   Complete by: As directed    Discharge instructions   Complete by: As directed    Follow-up with PCP, patient should be seen by an MD in 1 to 2 days, continue as needed medication for pain control and continue fall precaution.  Continue laxatives.  Repeat CBC after 1 week   Increase activity slowly   Complete by: As directed        The results of significant diagnostics from this  hospitalization (including imaging, microbiology, ancillary and laboratory) are listed below for reference.    Significant Diagnostic Studies: CT CHEST ABDOMEN PELVIS W CONTRAST Result Date: 08/09/2023 CLINICAL DATA:  Fall 4 weeks ago, sepsis, coccygeal pain EXAM: CT CHEST, ABDOMEN, AND PELVIS WITH CONTRAST TECHNIQUE: Multidetector CT imaging of the chest, abdomen and pelvis was performed following the standard protocol during bolus administration of intravenous contrast. RADIATION DOSE REDUCTION: This exam was performed according to the departmental dose-optimization  program which includes automated exposure control, adjustment of the mA and/or kV according to patient size and/or use of iterative reconstruction technique. CONTRAST:  OMNIPAQUE IOHEXOL 300 MG/ML  SOLN COMPARISON:  CT chest, 07/20/2023, CT abdomen pelvis, 03/05/2016 FINDINGS: CT CHEST FINDINGS Cardiovascular: No significant vascular findings. Normal heart size. No pericardial effusion. Mediastinum/Nodes: No enlarged mediastinal, hilar, or axillary lymph nodes. Thyroid gland, trachea, and esophagus demonstrate no significant findings. Lungs/Pleura: Lungs are clear. Trace left pleural effusion associated atelectasis or consolidation. Musculoskeletal: Extensive fat stranding and hematoma throughout the left chest wall musculature, new compared to prior examination (series 2, image 31). Numerous now subacute, partially callused fractures of the anterolateral left second through tenth ribs. Additional now subacute, nondisplaced fractures of the posterior left second through seventh ribs. CT ABDOMEN PELVIS FINDINGS Hepatobiliary: No solid liver abnormality is seen. No gallstones, gallbladder wall thickening, or biliary dilatation. Pancreas: Unremarkable. No pancreatic ductal dilatation or surrounding inflammatory changes. Spleen: Normal in size without significant abnormality. Adrenals/Urinary Tract: Adrenal glands are unremarkable. Simple, benign  bilateral renal cortical cysts, for which no further follow-up or characterization is required. Kidneys are otherwise normal, without renal calculi, solid lesion, or hydronephrosis. Bladder is unremarkable. Stomach/Bowel: Stomach is within normal limits. Appendix appears normal. No evidence of bowel wall thickening, distention, or inflammatory changes. Vascular/Lymphatic: Aortic atherosclerosis. No enlarged abdominal or pelvic lymph nodes. Reproductive: No mass or other abnormality. Other: No abdominal wall hernia or abnormality. No ascites. Musculoskeletal: Displaced, partially callused subacute fractures of the left pubic symphysis and adjacent pubic rami (series 2, image 113). Minimally angulated, nondisplaced anterior cortical fracture of the S2 segment (series 6, image 58). Intramedullary nail fixation of the right hip. IMPRESSION: 1. Extensive fat stranding and hematoma throughout the left chest wall musculature, new compared to prior examination unlikely secondary to numerous rib fractures. As detailed above, there are nondisplaced, subacute fractures of the anterolateral left second through tenth ribs and of the posterior left second through seventh ribs. Many of these fractures were not apparent on recent prior CT dated 07/20/2023; correlate for recurrent injury. 2. Trace left pleural effusion and associated atelectasis or consolidation. 3. Displaced, partially callused subacute fractures of the left pubic symphysis and adjacent pubic rami. 4. Minimally angulated, nondisplaced anterior cortical fracture of the S2 segment. 5. No CT evidence of acute traumatic injury to organs of the chest, abdomen, or pelvis. Aortic Atherosclerosis (ICD10-I70.0). Electronically Signed   By: Jearld Lesch M.D.   On: 08/09/2023 20:20   CT Head Wo Contrast Result Date: 08/09/2023 CLINICAL DATA:  Fall 4 weeks ago.  Reported rib fractures. EXAM: CT HEAD WITHOUT CONTRAST TECHNIQUE: Contiguous axial images were obtained from the  base of the skull through the vertex without intravenous contrast. RADIATION DOSE REDUCTION: This exam was performed according to the departmental dose-optimization program which includes automated exposure control, adjustment of the mA and/or kV according to patient size and/or use of iterative reconstruction technique. COMPARISON:  07/20/2023. FINDINGS: Brain: No evidence of acute infarction, hemorrhage, hydrocephalus, extra-axial collection or mass lesion/mass effect. Vascular: No hyperdense vessel or unexpected calcification. Skull: Normal. Negative for fracture or focal lesion. Sinuses/Orbits: Globes and orbits are unremarkable. Changes from prior sinus surgery. Sinuses are clear. Other: None. IMPRESSION: Normal head CT. Electronically Signed   By: Amie Portland M.D.   On: 08/09/2023 19:37   DG Lumbar Spine Complete Result Date: 08/09/2023 CLINICAL DATA:  Coccygeal pain. Pain began this morning. Fall 4 weeks ago. EXAM: LUMBAR SPINE - COMPLETE 4+ VIEW COMPARISON:  CT abdomen and pelvis, 03/05/2016. FINDINGS: Mild chronic compression deformity of L1. Mild depression of the upper endplate of L2 that is new from the prior CT, unclear chronicity. All remaining lumbar vertebra are normal in stature. Grade 1 anterolisthesis of L4 on L5 and L5 on S1. Mild loss of disc height at T12-L1 and L3-L4. Moderate loss of disc height at L4-L5 and L5-S1. Facet degenerative changes bilaterally lower lumbar spine. Skeletal structures are diffusely demineralized. Soft tissues are unremarkable. IMPRESSION: 1. Mild depression of the upper endplate of L2 that is new from the prior CT, unclear chronicity. Correlate with pain in this region. 2. No other evidence of an acute fracture. 3. Chronic compression deformity of L1. 4. Degenerative changes as detailed. Electronically Signed   By: Amie Portland M.D.   On: 08/09/2023 19:35   DG Sacrum/Coccyx Result Date: 08/09/2023 CLINICAL DATA:  Larey Seat 4 weeks ago, coccygeal pain EXAM: SACRUM  AND COCCYX - 2+ VIEW COMPARISON:  05/08/2022 FINDINGS: Frontal and lateral views of the sacrum and coccyx are obtained. There are no acute displaced fractures. Sacroiliac joints are unremarkable. Prominent spondylosis and facet hypertrophy within the lower lumbar spine. Mild degenerative grade 1 anterolisthesis of L4 on L5. Postsurgical changes are again noted within the right hip. IMPRESSION: 1. No acute fracture of the sacrum or coccyx. 2. Multilevel degenerative changes within the lower lumbar spine, with degenerative grade 1 anterolisthesis of L4 on L5. Electronically Signed   By: Sharlet Salina M.D.   On: 08/09/2023 19:35   CT CHEST WO CONTRAST Result Date: 07/20/2023 CLINICAL DATA:  Blunt chest Trauma, fell EXAM: CT CHEST WITHOUT CONTRAST TECHNIQUE: Multidetector CT imaging of the chest was performed following the standard protocol without IV contrast. RADIATION DOSE REDUCTION: This exam was performed according to the departmental dose-optimization program which includes automated exposure control, adjustment of the mA and/or kV according to patient size and/or use of iterative reconstruction technique. COMPARISON:  None Available. FINDINGS: Cardiovascular: Unenhanced imaging of the heart is unremarkable without pericardial effusion. Normal caliber of the thoracic aorta. Evaluation of the vascular lumen is limited without IV contrast. Mediastinum/Nodes: No enlarged mediastinal or axillary lymph nodes. Thyroid gland, trachea, and esophagus demonstrate no significant findings. Lungs/Pleura: No acute airspace disease, effusion, or pneumothorax. Calcified granuloma right middle lobe. Central airways are patent. Upper Abdomen: Noncalcified gallstones are seen within the gallbladder. No evidence of cholecystitis. Musculoskeletal: There is a segmented comminuted fracture of the left second rib, with associated soft tissue swelling. Minimally displaced left anterior third rib fracture is seen, with subtle cortical  buckle. Minimally displaced left posterior ninth rib fracture. No other acute displaced fractures. Reconstructed images demonstrate no additional findings. IMPRESSION: 1. Fractures of the left second, third, and ninth ribs as above. 2. No other acute intrathoracic trauma on this unenhanced exam. 3. Cholelithiasis without cholecystitis. Electronically Signed   By: Sharlet Salina M.D.   On: 07/20/2023 20:12   CT HEAD WO CONTRAST ( ) Result Date: 07/20/2023 CLINICAL DATA:  Larey Seat 4 feet from a ladder. EXAM: CT HEAD WITHOUT CONTRAST TECHNIQUE: Contiguous axial images were obtained from the base of the skull through the vertex without intravenous contrast. RADIATION DOSE REDUCTION: This exam was performed according to the departmental dose-optimization program which includes automated exposure control, adjustment of the mA and/or kV according to patient size and/or use of iterative reconstruction technique. COMPARISON:  None Available. FINDINGS: Brain: The brain shows a normal appearance without evidence of malformation, atrophy, old or acute small or large vessel infarction,  mass lesion, hemorrhage, hydrocephalus or extra-axial collection. Vascular: No hyperdense vessel. No evidence of atherosclerotic calcification. Skull: Normal.  No traumatic finding.  No focal bone lesion. Sinuses/Orbits: Sinuses are clear. Orbits appear normal. Mastoids are clear. Other: None significant IMPRESSION: Normal head CT. Electronically Signed   By: Paulina Fusi M.D.   On: 07/20/2023 18:35   CT Cervical Spine Wo Contrast Result Date: 07/20/2023 CLINICAL DATA:  Neck trauma.  Fell from a ladder. EXAM: CT CERVICAL SPINE WITHOUT CONTRAST TECHNIQUE: Multidetector CT imaging of the cervical spine was performed without intravenous contrast. Multiplanar CT image reconstructions were also generated. RADIATION DOSE REDUCTION: This exam was performed according to the departmental dose-optimization program which includes automated exposure  control, adjustment of the mA and/or kV according to patient size and/or use of iterative reconstruction technique. COMPARISON:  None Available. FINDINGS: Alignment: No traumatic malalignment. 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 At C5, there is a partially discontinuous osteophyte which I do not think represents an acute fracture. There is no soft tissue swelling in the region. Axial views show cortication of the posterior margin of the osteophyte. Solid bridging osteophytes are present anteriorly at C6-7. Soft tissues and spinal canal: No regional soft tissue swelling or traumatic soft tissue finding. Disc levels: The foramen magnum is widely patent. There is ordinary mild osteoarthritis of the C1-2 articulation but no encroachment upon the neural structures. C2-3: Congenital failure of segmentation as noted above. C3-4: Fusion across the disc space on the left that could be congenital or acquired. C4-5: Facet osteoarthritis on the left. Bony foraminal stenosis on the left. C5-6: Bilateral bony foraminal stenosis. C6-7: Endplate osteophytes with moderate canal encroachment. Foramina sufficiently patent. C7-T1: No canal or foraminal stenosis. Upper chest: Negative Other: None IMPRESSION: 1. No acute or traumatic finding. 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 Possible congenital failure of segmentation C3 and C4. 2. Partially discontinuous osteophyte at C5 which I do not think represents an acute fracture. There is no soft tissue swelling in the region. Axial views show cortication of the posterior margin of the osteophyte. 3. Bony foraminal stenosis on the left at C4-5 and bilaterally at C5-6. Moderate canal encroachment at C6-7 due to endplate osteophytes. Electronically Signed   By: Paulina Fusi M.D.   On: 07/20/2023 18:34    Microbiology: Recent Results (from the past 240 hours)  Culture, blood (Routine X 2) w Reflex to ID Panel     Status: None   Collection  Time: 08/09/23  9:48 PM   Specimen: Left Antecubital; Blood  Result Value Ref Range Status   Specimen Description LEFT ANTECUBITAL  Final   Special Requests   Final    BOTTLES DRAWN AEROBIC AND ANAEROBIC Blood Culture results may not be optimal due to an inadequate volume of blood received in culture bottles   Culture   Final    NO GROWTH 5 DAYS Performed at [facility], [address], [place], [place] [address]    Report Status 08/14/2023 FINAL  Final  Culture, blood (Routine X 2) w Reflex to ID Panel     Status: None   Collection Time: 08/09/23  9:48 PM   Specimen: BLOOD RIGHT FOREARM  Result Value Ref Range Status   Specimen Description BLOOD RIGHT FOREARM  Final   Special Requests   Final    BOTTLES DRAWN AEROBIC AND ANAEROBIC Blood Culture adequate volume   Culture   Final    NO GROWTH 5  DAYS Performed at [facility], [address] [place], [place] [address]    Report Status 08/14/2023 FINAL  Final     Labs: CBC: Recent Labs  Lab 08/11/23 0508 08/12/23 0441 08/13/23 0437 08/14/23 0433 08/15/23 0315 08/17/23 1035  WBC 10.6* 9.1 7.2 8.6 10.0 10.9*  NEUTROABS 6.7 5.9  --   --   --   --   HGB 8.6* 8.3* 7.9* 8.1* 8.7* 10.0*  HCT 26.7* 25.8* 25.0* 25.4* 27.1* 31.4*  MCV 97.1 97.7 97.7 98.1 97.1 97.5  PLT 198 183 199 197 221 263   Basic Metabolic Panel: Recent Labs  Lab 08/11/23 0508 08/12/23 0441 08/17/23 1035  NA 138 137 139  K 3.8 3.8 4.2  CL 108 107 104  CO2 25 26 27   GLUCOSE 100* 113* 112*  BUN 22 21 23   CREATININE 0.94 0.82 0.89  CALCIUM 8.0* 8.0* 8.7*  MG  --   --  2.2  PHOS  --   --  3.4   Liver Function Tests: No results for input(s): "AST", "ALT", "ALKPHOS", "BILITOT", "PROT", "ALBUMIN" in the last 168 hours. No results for input(s): "LIPASE", "AMYLASE" in the last 168 hours. No results for input(s): "AMMONIA" in the last 168 hours. Cardiac Enzymes: No results for input(s): "CKTOTAL", "CKMB", "CKMBINDEX", "TROPONINI" in  the last 168 hours. BNP (last 3 results) No results for input(s): "BNP" in the last 8760 hours. CBG: Recent Labs  Lab 08/15/23 0729  GLUCAP 106*    Time spent: 35 minutes  Signed:  Gillis Santa  [facility]  08/17/2023 12:29 PM

## 2023-08-17 NOTE — Care Management Important Message (Signed)
Important Message  Patient Details  Name: Tanner Baker MRN: [id number] Date of Birth: Jan 25, 1951   Important Message Given:  Yes - Medicare IM     Verita Schneiders Demorris Choyce 08/17/2023, 2:07 PM

## 2023-08-18 DIAGNOSIS — G894 Chronic pain syndrome: Secondary | ICD-10-CM | POA: Diagnosis not present

## 2023-08-19 ENCOUNTER — Ambulatory Visit: Admit: 2023-08-19 | Payer: Medicare HMO | Admitting: Internal Medicine

## 2023-08-19 DIAGNOSIS — I10 Essential (primary) hypertension: Secondary | ICD-10-CM | POA: Diagnosis not present

## 2023-08-19 SURGERY — COLONOSCOPY WITH PROPOFOL
Anesthesia: General

## 2023-08-20 DIAGNOSIS — F411 Generalized anxiety disorder: Secondary | ICD-10-CM | POA: Diagnosis not present

## 2023-08-20 DIAGNOSIS — M6259 Muscle wasting and atrophy, not elsewhere classified, multiple sites: Secondary | ICD-10-CM | POA: Diagnosis not present

## 2023-08-20 DIAGNOSIS — Z741 Need for assistance with personal care: Secondary | ICD-10-CM | POA: Diagnosis not present

## 2023-08-28 DIAGNOSIS — Z0000 Encounter for general adult medical examination without abnormal findings: Secondary | ICD-10-CM | POA: Diagnosis not present

## 2023-08-28 DIAGNOSIS — Z125 Encounter for screening for malignant neoplasm of prostate: Secondary | ICD-10-CM | POA: Diagnosis not present

## 2023-08-28 DIAGNOSIS — I10 Essential (primary) hypertension: Secondary | ICD-10-CM | POA: Diagnosis not present

## 2023-09-03 DIAGNOSIS — G894 Chronic pain syndrome: Secondary | ICD-10-CM | POA: Diagnosis not present

## 2023-09-03 DIAGNOSIS — Z2821 Immunization not carried out because of patient refusal: Secondary | ICD-10-CM | POA: Diagnosis not present

## 2023-09-03 DIAGNOSIS — F325 Major depressive disorder, single episode, in full remission: Secondary | ICD-10-CM | POA: Diagnosis not present

## 2023-09-03 DIAGNOSIS — G4730 Sleep apnea, unspecified: Secondary | ICD-10-CM | POA: Diagnosis not present

## 2023-09-03 DIAGNOSIS — Z21 Asymptomatic human immunodeficiency virus [HIV] infection status: Secondary | ICD-10-CM | POA: Diagnosis not present

## 2023-09-03 DIAGNOSIS — I10 Essential (primary) hypertension: Secondary | ICD-10-CM | POA: Diagnosis not present

## 2023-10-05 ENCOUNTER — Ambulatory Visit: Payer: Medicare HMO | Attending: Anesthesiology | Admitting: Anesthesiology

## 2023-10-05 ENCOUNTER — Encounter: Payer: Self-pay | Admitting: Anesthesiology

## 2023-10-05 DIAGNOSIS — M47816 Spondylosis without myelopathy or radiculopathy, lumbar region: Secondary | ICD-10-CM | POA: Diagnosis not present

## 2023-10-05 DIAGNOSIS — T402X5A Adverse effect of other opioids, initial encounter: Secondary | ICD-10-CM | POA: Diagnosis not present

## 2023-10-05 DIAGNOSIS — M5136 Other intervertebral disc degeneration, lumbar region with discogenic back pain only: Secondary | ICD-10-CM | POA: Diagnosis not present

## 2023-10-05 DIAGNOSIS — G894 Chronic pain syndrome: Secondary | ICD-10-CM | POA: Diagnosis not present

## 2023-10-05 DIAGNOSIS — S2249XD Multiple fractures of ribs, unspecified side, subsequent encounter for fracture with routine healing: Secondary | ICD-10-CM | POA: Diagnosis not present

## 2023-10-05 DIAGNOSIS — M79642 Pain in left hand: Secondary | ICD-10-CM

## 2023-10-05 DIAGNOSIS — K5903 Drug induced constipation: Secondary | ICD-10-CM

## 2023-10-05 DIAGNOSIS — G8929 Other chronic pain: Secondary | ICD-10-CM

## 2023-10-05 DIAGNOSIS — M170 Bilateral primary osteoarthritis of knee: Secondary | ICD-10-CM | POA: Diagnosis not present

## 2023-10-05 DIAGNOSIS — M79641 Pain in right hand: Secondary | ICD-10-CM

## 2023-10-05 MED ORDER — OXYCODONE-ACETAMINOPHEN 10-325 MG PO TABS: 1.0000 | ORAL_TABLET | Freq: Four times a day (QID) | ORAL | 0 refills | Status: AC | PRN

## 2023-10-05 MED ORDER — GABAPENTIN 100 MG PO CAPS: 100.0000 mg | ORAL_CAPSULE | Freq: Three times a day (TID) | ORAL | 2 refills | Status: DC

## 2023-10-05 NOTE — Progress Notes (Addendum)
Virtual Visit via Telephone Note  I connected with Tanner Baker on 10/05/23 at  9:20 AM EST by telephone and verified that I am speaking with the correct person using two identifiers.  Location: Patient: Home Provider: [facility] control center   I discussed the limitations, risks, security and privacy concerns of performing an evaluation and management service by telephone and the availability of in person appointments. I also discussed with the patient that there may be a patient responsible charge related to this service. The patient expressed understanding and agreed to proceed.   History of Present Illness: I spoke with Tanner Baker via telephone as we are unable link for the video portion of the conference.  He reports that the pain medicines with the extended release morphine are continue to give him good pain relief but he still having problems with constipation.  He was started on Movantik recently and this did not give him much relief.  Otherwise the quality characteristic and distribution of the pain he is experiencing is unchanged.  In the past he had been on Percocet with fewer constipation side effects.  Unfortunately he has required chronic opioid therapy secondary to the severity of his pain but the constipation has been quite an issue.  He is using some over-the-counter remedies that have helped but feels the Movantik has been of limited success.  In further discussion no change in lower extremity strength function bowel or bladder function is noted other than the constipation.  He has done well with chronic opioid therapy generally getting good relief that has enabled him to stay functional active and sleep better.  Review of systems: General: No fevers or chills Pulmonary: No shortness of breath or dyspnea Cardiac: No angina or palpitations or lightheadedness GI: No abdominal pain or constipation Psych: No depression    Observations/Objective:  Current Outpatient Medications:     gabapentin (NEURONTIN) 100 MG capsule, Take 1 capsule (100 mg total) by mouth 3 (three) times daily., Disp: 120 capsule, Rfl: 2   oxyCODONE-acetaminophen (PERCOCET) 10-325 MG tablet, Take 1 tablet by mouth every 6 (six) hours as needed for pain., Disp: 120 tablet, Rfl: 0   acetaminophen (TYLENOL) 325 MG tablet, Take 2 tablets (650 mg total) by mouth every 6 (six) hours as needed for mild pain (pain score 1-3), moderate pain (pain score 4-6), fever or headache., Disp: , Rfl:    ascorbic acid (VITAMIN C) 500 MG tablet, Take 1 tablet (500 mg total) by mouth daily., Disp: , Rfl:    bisacodyl (DULCOLAX) 5 MG EC tablet, Take 2 tablets (10 mg total) by mouth at bedtime., Disp: , Rfl:    cyclobenzaprine (FLEXERIL) 10 MG tablet, Take 1 tablet (10 mg total) by mouth at bedtime. (Patient taking differently: Take 10 mg by mouth at bedtime as needed.), Disp: 30 tablet, Rfl: 2   diclofenac Sodium (VOLTAREN) 1 % GEL, Apply topically., Disp: , Rfl:    famotidine (PEPCID) 20 MG tablet, Take 20 mg by mouth 2 (two) times daily., Disp: , Rfl:    fluticasone (FLONASE) 50 MCG/ACT nasal spray, Place 1 spray into both nostrils daily as needed. Reported on 03/21/2016, Disp: , Rfl:    gabapentin (NEURONTIN) 100 MG capsule, Take 1 capsule (100 mg total) by mouth 3 (three) times daily., Disp: 90 capsule, Rfl: 3   iron polysaccharides (NIFEREX) 150 MG capsule, Take 1 capsule (150 mg total) by mouth daily., Disp: , Rfl:    linaclotide (LINZESS) 72 MCG capsule, Take 1 capsule (72 mcg  total) by mouth daily before breakfast., Disp: 30 capsule, Rfl: 1   morphine (MS CONTIN) 30 MG 12 hr tablet, Take 1 tablet (30 mg total) by mouth 3 (three) times daily., Disp: 10 tablet, Rfl: 0   Multiple Vitamins-Iron (MULTIVITAMINS WITH IRON) TABS tablet, Take 1 tablet by mouth daily., Disp: , Rfl:    naloxone (NARCAN) nasal spray 4 mg/0.1 mL, SMARTSIG:Both Nares, Disp: , Rfl:    ondansetron (ZOFRAN) 4 MG tablet, Take 1 tablet by mouth every 6  (six) hours as needed., Disp: , Rfl:    PARoxetine (PAXIL) 30 MG tablet, Take 1 tablet by mouth daily., Disp: , Rfl:    polyethylene glycol (MIRALAX / GLYCOLAX) 17 g packet, Take 17 g by mouth 2 (two) times daily., Disp: , Rfl:    SUMAtriptan (IMITREX) 100 MG tablet, Take 100 mg by mouth once. May repeat in 2 hours if headache persists or recurs., Disp: , Rfl:    tamsulosin (FLOMAX) 0.4 MG CAPS capsule, Take 1 capsule (0.4 mg total) by mouth daily., Disp: 30 capsule, Rfl: 11   topiramate (TOPAMAX) 50 MG tablet, Take 50 mg by mouth 2 (two) times daily., Disp: , Rfl:    traZODone (DESYREL) 50 MG tablet, Take 100 mg by mouth at bedtime as needed for sleep. Take 1-2 tablets at bedtime as needed, Disp: , Rfl:    Past Medical History:  Diagnosis Date   Anxiety    Cancer (HCC)    skin cancer   Chronic low back pain 05/09/2021   Chronic pain syndrome    Chronic, continuous use of opioids 05/09/2021   DDD (degenerative disc disease), cervical    Depression    Depression    Glaucoma    Heartburn    Hypertension    Kidney stone    Kidney stones    Knee pain, bilateral 05/09/2021   Migraine    Migraine    Post-traumatic headache    Sleep apnea    Traumatic arthritis    Assessment and Plan:  1. Chronic bilateral low back pain without sciatica   2. Degeneration of intervertebral disc of lumbar region with discogenic back pain   3. Primary osteoarthritis of both knees   4. Chronic pain syndrome   5. Facet syndrome, lumbar   6. Pain in both hands   7. Closed fracture of multiple ribs with routine healing, unspecified laterality, subsequent encounter   8. Opioid-induced constipation    Based on conversation of going to switch him back over to the Percocet for a period of time to see if this helps with his frequency of constipation.  Will move him over to a dose suggested Percocet 10 mg tablet 4 times a day.  He is to stop the morphine sulfate after this morning's dose.  He can continue the  Movantik.  Furthermore we are going to increase his gabapentin to a twice daily dosing at the 100 mg strength for the next 2 weeks then he can increase to 3 tablets/day on 3 times daily dosing for the next 2 weeks and ultimately 1 tablet in the morning 1 tablet afternoon and 2 tablets in the evening to see if this helps with his overall pain control.  No other changes are initiated at this time.  I encouraged him to continue with exercises as reviewed.  Continue follow-up with his primary care physicians for baseline medical care with return to clinic scheduled in 1 month. Follow Up Instructions:    I discussed the assessment  and treatment plan with the patient. The patient was provided an opportunity to ask questions and all were answered. The patient agreed with the plan and demonstrated an understanding of the instructions.   The patient was advised to call back or seek an in-person evaluation if the symptoms worsen or if the condition fails to improve as anticipated.  I provided 30 minutes of non-face-to-face time during this encounter.   Yevette Edwards, MD

## 2023-10-09 DIAGNOSIS — G43011 Migraine without aura, intractable, with status migrainosus: Secondary | ICD-10-CM | POA: Diagnosis not present

## 2023-10-09 DIAGNOSIS — Z21 Asymptomatic human immunodeficiency virus [HIV] infection status: Secondary | ICD-10-CM | POA: Diagnosis not present

## 2023-10-09 DIAGNOSIS — R110 Nausea: Secondary | ICD-10-CM | POA: Diagnosis not present

## 2023-10-12 ENCOUNTER — Telehealth: Payer: Self-pay

## 2023-10-12 NOTE — Telephone Encounter (Signed)
 He needs to come in for an appointment to discuss with Dr. Welton Hall.

## 2023-10-12 NOTE — Telephone Encounter (Signed)
 He put him oxycodone  10/325. He started having migraines. He went to the walk in clinic and they gave him 2 shots. He stopped taking the oxycodone  anymore and started taking his morphine  instead. He couldn't sleep and he was hurting so bad. Can Dr. Welton Hall change his meds back to morphine  or something else? He changed him to oxycodone  because his bowels weren't working so is there another medication he can be on?

## 2023-10-19 ENCOUNTER — Encounter: Payer: Self-pay | Admitting: Anesthesiology

## 2023-10-19 ENCOUNTER — Ambulatory Visit: Payer: Medicare HMO | Attending: Anesthesiology | Admitting: Anesthesiology

## 2023-10-19 DIAGNOSIS — T402X5A Adverse effect of other opioids, initial encounter: Secondary | ICD-10-CM | POA: Diagnosis not present

## 2023-10-19 DIAGNOSIS — G894 Chronic pain syndrome: Secondary | ICD-10-CM

## 2023-10-19 DIAGNOSIS — G8929 Other chronic pain: Secondary | ICD-10-CM

## 2023-10-19 DIAGNOSIS — M47816 Spondylosis without myelopathy or radiculopathy, lumbar region: Secondary | ICD-10-CM | POA: Diagnosis not present

## 2023-10-19 DIAGNOSIS — F1190 Opioid use, unspecified, uncomplicated: Secondary | ICD-10-CM

## 2023-10-19 DIAGNOSIS — M170 Bilateral primary osteoarthritis of knee: Secondary | ICD-10-CM

## 2023-10-19 DIAGNOSIS — K5903 Drug induced constipation: Secondary | ICD-10-CM

## 2023-10-19 DIAGNOSIS — M5136 Other intervertebral disc degeneration, lumbar region with discogenic back pain only: Secondary | ICD-10-CM | POA: Diagnosis not present

## 2023-10-19 DIAGNOSIS — M79641 Pain in right hand: Secondary | ICD-10-CM | POA: Diagnosis not present

## 2023-10-19 DIAGNOSIS — M79642 Pain in left hand: Secondary | ICD-10-CM | POA: Diagnosis not present

## 2023-10-19 DIAGNOSIS — Z79891 Long term (current) use of opiate analgesic: Secondary | ICD-10-CM | POA: Diagnosis not present

## 2023-10-19 MED ORDER — MORPHINE SULFATE ER 30 MG PO TBCR: 30.0000 mg | EXTENDED_RELEASE_TABLET | Freq: Two times a day (BID) | ORAL | 0 refills | Status: AC

## 2023-10-19 NOTE — Patient Instructions (Signed)

## 2023-10-19 NOTE — Progress Notes (Signed)
Virtual Visit via Telephone Note  I connected with Tanner Baker on 10/19/23 at  1:40 PM EST by telephone and verified that I am speaking with the correct person using two identifiers.  Location: Patient: Home Provider: Pain control center   I discussed the limitations, risks, security and privacy concerns of performing an evaluation and management service by telephone and the availability of in person appointments. I also discussed with the patient that there may be a patient responsible charge related to this service. The patient expressed understanding and agreed to proceed.   History of Present Illness: I spoke with Tanner Baker via telephone for his appointment.  We were unable link for the video portion of this.  He reports that he is doing better regarding the constipation with the oxycodone 10 mg tablets 4 times a day however he did have problems with nausea and feeling flulike with the transition from the MS Contin 30 mg 3 times a day which was causing issues with constipation.  Based on our conversation today he was getting good coverage with the 30 mg dose generally lasting well in excess of 6 hours before he had recurrence of his baseline pain in the back and lower legs and hips.  No other changes in the quality characteristic or distribution of his pain are noted at this time.  He is due for refill in 3 days.  Review of systems: General: No fevers or chills Pulmonary: No shortness of breath or dyspnea Cardiac: No angina or palpitations or lightheadedness GI: No abdominal pain or constipation Psych: No depression    Observations/Objective:  Current Outpatient Medications:    [START ON 10/22/2023] morphine (MS CONTIN) 30 MG 12 hr tablet, Take 1 tablet (30 mg total) by mouth every 12 (twelve) hours., Disp: 75 tablet, Rfl: 0   acetaminophen (TYLENOL) 325 MG tablet, Take 2 tablets (650 mg total) by mouth every 6 (six) hours as needed for mild pain (pain score 1-3), moderate pain  (pain score 4-6), fever or headache., Disp: , Rfl:    ascorbic acid (VITAMIN C) 500 MG tablet, Take 1 tablet (500 mg total) by mouth daily., Disp: , Rfl:    bisacodyl (DULCOLAX) 5 MG EC tablet, Take 2 tablets (10 mg total) by mouth at bedtime., Disp: , Rfl:    diclofenac Sodium (VOLTAREN) 1 % GEL, Apply topically., Disp: , Rfl:    famotidine (PEPCID) 20 MG tablet, Take 20 mg by mouth 2 (two) times daily., Disp: , Rfl:    fluticasone (FLONASE) 50 MCG/ACT nasal spray, Place 1 spray into both nostrils daily as needed. Reported on 03/21/2016, Disp: , Rfl:    gabapentin (NEURONTIN) 100 MG capsule, Take 1 capsule (100 mg total) by mouth 3 (three) times daily., Disp: 90 capsule, Rfl: 3   gabapentin (NEURONTIN) 100 MG capsule, Take 1 capsule (100 mg total) by mouth 3 (three) times daily., Disp: 120 capsule, Rfl: 2   iron polysaccharides (NIFEREX) 150 MG capsule, Take 1 capsule (150 mg total) by mouth daily., Disp: , Rfl:    linaclotide (LINZESS) 72 MCG capsule, Take 1 capsule (72 mcg total) by mouth daily before breakfast., Disp: 30 capsule, Rfl: 1   morphine (MS CONTIN) 30 MG 12 hr tablet, Take 1 tablet (30 mg total) by mouth 3 (three) times daily., Disp: 10 tablet, Rfl: 0   Multiple Vitamins-Iron (MULTIVITAMINS WITH IRON) TABS tablet, Take 1 tablet by mouth daily., Disp: , Rfl:    naloxone (NARCAN) nasal spray 4 mg/0.1 mL, SMARTSIG:Both Nares,  Disp: , Rfl:    ondansetron (ZOFRAN) 4 MG tablet, Take 1 tablet by mouth every 6 (six) hours as needed., Disp: , Rfl:    oxyCODONE-acetaminophen (PERCOCET) 10-325 MG tablet, Take 1 tablet by mouth every 6 (six) hours as needed for pain., Disp: 120 tablet, Rfl: 0   PARoxetine (PAXIL) 30 MG tablet, Take 1 tablet by mouth daily., Disp: , Rfl:    polyethylene glycol (MIRALAX / GLYCOLAX) 17 g packet, Take 17 g by mouth 2 (two) times daily., Disp: , Rfl:    SUMAtriptan (IMITREX) 100 MG tablet, Take 100 mg by mouth once. May repeat in 2 hours if headache persists or  recurs., Disp: , Rfl:    tamsulosin (FLOMAX) 0.4 MG CAPS capsule, Take 1 capsule (0.4 mg total) by mouth daily., Disp: 30 capsule, Rfl: 11   topiramate (TOPAMAX) 50 MG tablet, Take 50 mg by mouth 2 (two) times daily., Disp: , Rfl:    traZODone (DESYREL) 50 MG tablet, Take 100 mg by mouth at bedtime as needed for sleep. Take 1-2 tablets at bedtime as needed, Disp: , Rfl:    Past Medical History:  Diagnosis Date   Anxiety    Cancer (HCC)    skin cancer   Chronic low back pain 05/09/2021   Chronic pain syndrome    Chronic, continuous use of opioids 05/09/2021   DDD (degenerative disc disease), cervical    Depression    Depression    Glaucoma    Heartburn    Hypertension    Kidney stone    Kidney stones    Knee pain, bilateral 05/09/2021   Migraine    Migraine    Post-traumatic headache    Sleep apnea    Traumatic arthritis    Assessment and Plan:  1. Chronic bilateral low back pain without sciatica   2. Degeneration of intervertebral disc of lumbar region with discogenic back pain   3. Primary osteoarthritis of both knees   4. Chronic pain syndrome   5. Facet syndrome, lumbar   6. Pain in both hands   7. Opioid-induced constipation   8. Chronic, continuous use of opioids    Based on our conversation think it is appropriate to continue on the MS Contin but I am going to reduce him to 3 times daily dosing and then twice daily dosing on the following day with alternating therapy 3 times daily and twice daily for average of 5 tablets over 48 hours.  We will do this for 1 month with an objective to ultimately reduce to twice daily dosing on the 30 mg extended release morphine.  Scheduled for a reevaluation in 1 month to consider continuation of twice daily dosing on the 30 mg MS Contin or possibly reduction to a 15 mg twice daily dosing.  We gone over the protocol and plan all questions answered.  Continue follow-up with primary care physician for baseline medical care with return to clinic  scheduled in 1 month. Follow Up Instructions:    I discussed the assessment and treatment plan with the patient. The patient was provided an opportunity to ask questions and all were answered. The patient agreed with the plan and demonstrated an understanding of the instructions.   The patient was advised to call back or seek an in-person evaluation if the symptoms worsen or if the condition fails to improve as anticipated.  I provided 30 minutes of non-face-to-face time during this encounter.   Yevette Edwards, MD

## 2023-10-22 ENCOUNTER — Telehealth: Payer: Self-pay | Admitting: Anesthesiology

## 2023-10-22 NOTE — Telephone Encounter (Signed)
Contacted patient, states his insurance will not pay for 75 tablets. He states he will have enough, so no further action needed.

## 2023-10-22 NOTE — Telephone Encounter (Signed)
Patient states Dr Pernell Dupre called in 75 morphine tablets. Insurance will only approve 60 as dosage is written. Please advise patient

## 2023-11-02 ENCOUNTER — Telehealth: Payer: Medicare HMO | Admitting: Anesthesiology

## 2023-11-13 DIAGNOSIS — D2272 Melanocytic nevi of left lower limb, including hip: Secondary | ICD-10-CM | POA: Diagnosis not present

## 2023-11-13 DIAGNOSIS — D2261 Melanocytic nevi of right upper limb, including shoulder: Secondary | ICD-10-CM | POA: Diagnosis not present

## 2023-11-13 DIAGNOSIS — D2239 Melanocytic nevi of other parts of face: Secondary | ICD-10-CM | POA: Diagnosis not present

## 2023-11-13 DIAGNOSIS — Z85820 Personal history of malignant melanoma of skin: Secondary | ICD-10-CM | POA: Diagnosis not present

## 2023-11-13 DIAGNOSIS — D2262 Melanocytic nevi of left upper limb, including shoulder: Secondary | ICD-10-CM | POA: Diagnosis not present

## 2023-11-13 DIAGNOSIS — L814 Other melanin hyperpigmentation: Secondary | ICD-10-CM | POA: Diagnosis not present

## 2023-11-13 DIAGNOSIS — C44319 Basal cell carcinoma of skin of other parts of face: Secondary | ICD-10-CM | POA: Diagnosis not present

## 2023-11-13 DIAGNOSIS — C44519 Basal cell carcinoma of skin of other part of trunk: Secondary | ICD-10-CM | POA: Diagnosis not present

## 2023-11-13 DIAGNOSIS — D225 Melanocytic nevi of trunk: Secondary | ICD-10-CM | POA: Diagnosis not present

## 2023-11-13 DIAGNOSIS — C4441 Basal cell carcinoma of skin of scalp and neck: Secondary | ICD-10-CM | POA: Diagnosis not present

## 2023-11-13 DIAGNOSIS — L570 Actinic keratosis: Secondary | ICD-10-CM | POA: Diagnosis not present

## 2023-11-13 DIAGNOSIS — D485 Neoplasm of uncertain behavior of skin: Secondary | ICD-10-CM | POA: Diagnosis not present

## 2023-11-16 ENCOUNTER — Ambulatory Visit: Payer: Medicare HMO | Attending: Anesthesiology | Admitting: Anesthesiology

## 2023-11-16 ENCOUNTER — Encounter: Payer: Self-pay | Admitting: Anesthesiology

## 2023-11-16 DIAGNOSIS — M79642 Pain in left hand: Secondary | ICD-10-CM | POA: Diagnosis not present

## 2023-11-16 DIAGNOSIS — M170 Bilateral primary osteoarthritis of knee: Secondary | ICD-10-CM

## 2023-11-16 DIAGNOSIS — K5903 Drug induced constipation: Secondary | ICD-10-CM

## 2023-11-16 DIAGNOSIS — G894 Chronic pain syndrome: Secondary | ICD-10-CM

## 2023-11-16 DIAGNOSIS — T402X5A Adverse effect of other opioids, initial encounter: Secondary | ICD-10-CM | POA: Diagnosis not present

## 2023-11-16 DIAGNOSIS — M47816 Spondylosis without myelopathy or radiculopathy, lumbar region: Secondary | ICD-10-CM

## 2023-11-16 DIAGNOSIS — M79641 Pain in right hand: Secondary | ICD-10-CM

## 2023-11-16 DIAGNOSIS — Z79891 Long term (current) use of opiate analgesic: Secondary | ICD-10-CM

## 2023-11-16 DIAGNOSIS — G8929 Other chronic pain: Secondary | ICD-10-CM

## 2023-11-16 DIAGNOSIS — M5136 Other intervertebral disc degeneration, lumbar region with discogenic back pain only: Secondary | ICD-10-CM

## 2023-11-16 DIAGNOSIS — F1190 Opioid use, unspecified, uncomplicated: Secondary | ICD-10-CM

## 2023-11-16 MED ORDER — MORPHINE SULFATE ER 30 MG PO TBCR: 30.0000 mg | EXTENDED_RELEASE_TABLET | Freq: Two times a day (BID) | ORAL | 0 refills | Status: DC

## 2023-11-16 MED ORDER — GABAPENTIN 100 MG PO CAPS: 100.0000 mg | ORAL_CAPSULE | Freq: Three times a day (TID) | ORAL | 4 refills | Status: AC

## 2023-11-16 NOTE — Progress Notes (Unsigned)
 Virtual Visit via Telephone Note  I connected with Tanner Baker on 11/16/23 at  1:20 PM EDT by telephone and verified that I am speaking with the correct person using two identifiers.  Location: Patient: Home Provider: Pain control center   I discussed the limitations, risks, security and privacy concerns of performing an evaluation and management service by telephone and the availability of in person appointments. I also discussed with the patient that there may be a patient responsible charge related to this service. The patient expressed understanding and agreed to proceed.   History of Present Illness: I spoke with Tanner Baker via telephone as we could not link for the video portion conference.  He reports that he is doing better in regards to the control of his back pain with the MS Contin twice a day although he does breakthrough frequently in mid afternoon.  However he is having less problems with chronic constipation which was a significant issue for him recently.  His bowel function has been better he is taking some over-the-counter products to assist with this and that is working effectively for him.  He still taking his gabapentin 3 times a day and that also is assisting with lower extremity pain and back pain coverage.  He does a stretching strengthening exercises daily and is staying active.  He feels that with the medication he is able to maintain an active lifestyle whereas without medicine he was dysfunctional.  No change in lower extremity strength function bowel or bladder function is noted.  Review of systems: General: No fevers or chills Pulmonary: No shortness of breath or dyspnea Cardiac: No angina or palpitations or lightheadedness GI: No abdominal pain or constipation Psych: No depression    Observations/Objective:  Current Outpatient Medications:    [START ON 12/21/2023] morphine (MS CONTIN) 30 MG 12 hr tablet, Take 1 tablet (30 mg total) by mouth every 12  (twelve) hours., Disp: 60 tablet, Rfl: 0   acetaminophen (TYLENOL) 325 MG tablet, Take 2 tablets (650 mg total) by mouth every 6 (six) hours as needed for mild pain (pain score 1-3), moderate pain (pain score 4-6), fever or headache., Disp: , Rfl:    ascorbic acid (VITAMIN C) 500 MG tablet, Take 1 tablet (500 mg total) by mouth daily., Disp: , Rfl:    bisacodyl (DULCOLAX) 5 MG EC tablet, Take 2 tablets (10 mg total) by mouth at bedtime., Disp: , Rfl:    diclofenac Sodium (VOLTAREN) 1 % GEL, Apply topically., Disp: , Rfl:    famotidine (PEPCID) 20 MG tablet, Take 20 mg by mouth 2 (two) times daily., Disp: , Rfl:    fluticasone (FLONASE) 50 MCG/ACT nasal spray, Place 1 spray into both nostrils daily as needed. Reported on 03/21/2016, Disp: , Rfl:    gabapentin (NEURONTIN) 100 MG capsule, Take 1 capsule (100 mg total) by mouth 3 (three) times daily., Disp: 90 capsule, Rfl: 3   gabapentin (NEURONTIN) 100 MG capsule, Take 1 capsule (100 mg total) by mouth 3 (three) times daily., Disp: 120 capsule, Rfl: 4   iron polysaccharides (NIFEREX) 150 MG capsule, Take 1 capsule (150 mg total) by mouth daily., Disp: , Rfl:    linaclotide (LINZESS) 72 MCG capsule, Take 1 capsule (72 mcg total) by mouth daily before breakfast., Disp: 30 capsule, Rfl: 1   morphine (MS CONTIN) 30 MG 12 hr tablet, Take 1 tablet (30 mg total) by mouth every 12 (twelve) hours., Disp: 75 tablet, Rfl: 0   [START ON 11/21/2023] morphine (MS  CONTIN) 30 MG 12 hr tablet, Take 1 tablet (30 mg total) by mouth every 12 (twelve) hours., Disp: 60 tablet, Rfl: 0   Multiple Vitamins-Iron (MULTIVITAMINS WITH IRON) TABS tablet, Take 1 tablet by mouth daily., Disp: , Rfl:    naloxone (NARCAN) nasal spray 4 mg/0.1 mL, SMARTSIG:Both Nares, Disp: , Rfl:    ondansetron (ZOFRAN) 4 MG tablet, Take 1 tablet by mouth every 6 (six) hours as needed., Disp: , Rfl:    PARoxetine (PAXIL) 30 MG tablet, Take 1 tablet by mouth daily., Disp: , Rfl:    polyethylene glycol  (MIRALAX / GLYCOLAX) 17 g packet, Take 17 g by mouth 2 (two) times daily., Disp: , Rfl:    SUMAtriptan (IMITREX) 100 MG tablet, Take 100 mg by mouth once. May repeat in 2 hours if headache persists or recurs., Disp: , Rfl:    tamsulosin (FLOMAX) 0.4 MG CAPS capsule, Take 1 capsule (0.4 mg total) by mouth daily., Disp: 30 capsule, Rfl: 11   topiramate (TOPAMAX) 50 MG tablet, Take 50 mg by mouth 2 (two) times daily., Disp: , Rfl:    traZODone (DESYREL) 50 MG tablet, Take 100 mg by mouth at bedtime as needed for sleep. Take 1-2 tablets at bedtime as needed, Disp: , Rfl:    Past Medical History:  Diagnosis Date   Anxiety    Cancer (HCC)    skin cancer   Chronic low back pain 05/09/2021   Chronic pain syndrome    Chronic, continuous use of opioids 05/09/2021   DDD (degenerative disc disease), cervical    Depression    Depression    Glaucoma    Heartburn    Hypertension    Kidney stone    Kidney stones    Knee pain, bilateral 05/09/2021   Migraine    Migraine    Post-traumatic headache    Sleep apnea    Traumatic arthritis    Assessment and Plan:  1. Chronic bilateral low back pain without sciatica   2. Degeneration of intervertebral disc of lumbar region with discogenic back pain   3. Primary osteoarthritis of both knees   4. Chronic pain syndrome   5. Facet syndrome, lumbar   6. Pain in both hands   7. Opioid-induced constipation   8. Chronic, continuous use of opioids   9. Chronic pain of both knees    Based on our conversation it is appropriate to refill his medicines for the next 2 months.  Will keep him on the MS Contin 30 mg twice a day dosing.  He seems to be doing well with this with generally good coverage other than some breakthrough pain during mid afternoon.  Should that change we may add in a short acting opioid for coverage during the afternoon or consider an increase to the 30 mg 3 times a day though there is at risk for possible recurrence of the significant  constipation problem he was running into.  At this point we have agreed that we will keep him on this twice daily dosing regimen.  Continue with gabapentin continue with stretching strengthening exercises as reviewed.  Continue follow-up with his primary care physicians for baseline medical care with return to clinic in 2 months. Follow Up Instructions:    I discussed the assessment and treatment plan with the patient. The patient was provided an opportunity to ask questions and all were answered. The patient agreed with the plan and demonstrated an understanding of the instructions.   The patient was advised  to call back or seek an in-person evaluation if the symptoms worsen or if the condition fails to improve as anticipated.  I provided 30 minutes of non-face-to-face time during this encounter.   Yevette Edwards, MD

## 2023-11-23 ENCOUNTER — Telehealth: Payer: Self-pay | Admitting: Anesthesiology

## 2023-11-23 ENCOUNTER — Other Ambulatory Visit: Payer: Self-pay | Admitting: *Deleted

## 2023-11-23 MED ORDER — MORPHINE SULFATE ER 30 MG PO TBCR: 30.0000 mg | EXTENDED_RELEASE_TABLET | Freq: Two times a day (BID) | ORAL | 0 refills | Status: AC

## 2023-11-23 NOTE — Telephone Encounter (Signed)
 Rx request sent to Dr. Pernell Dupre. Rx for Morphine for 11-21-23 cancelled at [facility] on [address].

## 2023-11-23 NOTE — Telephone Encounter (Signed)
 PT called stated [facility] on [address]. has the morphine in stock. PT stated that he is out needs the medication today. PT asked if a nurse will give him a call, once prescription gas been send in. TY

## 2023-12-22 ENCOUNTER — Telehealth: Payer: Self-pay | Admitting: Anesthesiology

## 2023-12-22 NOTE — Telephone Encounter (Signed)
 Patient is unable to get any script filled for morhpine. All pharmacies are on back order with unknown fill dates. Please ask Dr Welton Hall what he can do. Patient is out of medication.   [phone]

## 2023-12-23 ENCOUNTER — Telehealth: Payer: Self-pay | Admitting: Anesthesiology

## 2023-12-23 ENCOUNTER — Other Ambulatory Visit: Payer: Self-pay | Admitting: *Deleted

## 2023-12-23 MED ORDER — HYDROCODONE-ACETAMINOPHEN 10-325 MG PO TABS: 1.0000 | ORAL_TABLET | Freq: Three times a day (TID) | ORAL | 0 refills | Status: AC

## 2023-12-23 MED ORDER — HYDROCODONE-ACETAMINOPHEN 10-325 MG PO TABS: 1.0000 | ORAL_TABLET | Freq: Three times a day (TID) | ORAL | 0 refills | Status: DC

## 2023-12-23 MED ORDER — FENTANYL 50 MCG/HR TD PT72: 1.0000 | MEDICATED_PATCH | TRANSDERMAL | 0 refills | Status: DC

## 2023-12-23 MED ORDER — FENTANYL 50 MCG/HR TD PT72: 1.0000 | MEDICATED_PATCH | TRANSDERMAL | 0 refills | Status: AC

## 2023-12-23 NOTE — Addendum Note (Signed)
 Addended by: Zula Hitch on: 12/23/2023 08:14 AM   Modules accepted: Orders

## 2023-12-23 NOTE — Telephone Encounter (Signed)
 Per Dr. Welton Hall, ask patient if he would prefer to use Fentanyl  patch and Hydrocodone , as used previously. Contacted patient, he said he will do that. Dr. Welton Hall notified.

## 2023-12-23 NOTE — Telephone Encounter (Signed)
 Does not want to take only Hydrocodone , cannot afford Fentanyl  patch.  Will ask Dr. Welton Hall tomorrow if he will prescribe Morphine  to [facility] in [place].

## 2023-12-23 NOTE — Telephone Encounter (Signed)
 [facility] found [facility] morphine  meds. Wants script sent there. Patient cannot afford the fentanyl  at $140 per script.

## 2023-12-23 NOTE — Telephone Encounter (Signed)
 Patient called stating the [facility] . He cannot afford the fentanyl  $140, insurance will not cover it. Please call patient. He wants morhphine sent back in to [facility] [place].

## 2023-12-24 ENCOUNTER — Other Ambulatory Visit: Payer: Self-pay | Admitting: *Deleted

## 2023-12-24 ENCOUNTER — Telehealth: Payer: Self-pay | Admitting: *Deleted

## 2023-12-24 MED ORDER — MORPHINE SULFATE ER 30 MG PO TBCR: 30.0000 mg | EXTENDED_RELEASE_TABLET | Freq: Two times a day (BID) | ORAL | 0 refills | Status: AC

## 2023-12-24 NOTE — Telephone Encounter (Signed)
 Cancelled prescriptions for Hydrocodone  and Fentanyl  patch at [facility].

## 2024-01-13 DIAGNOSIS — C4441 Basal cell carcinoma of skin of scalp and neck: Secondary | ICD-10-CM | POA: Diagnosis not present

## 2024-01-13 DIAGNOSIS — C44319 Basal cell carcinoma of skin of other parts of face: Secondary | ICD-10-CM | POA: Diagnosis not present

## 2024-01-20 DIAGNOSIS — L905 Scar conditions and fibrosis of skin: Secondary | ICD-10-CM | POA: Diagnosis not present

## 2024-01-20 DIAGNOSIS — C44319 Basal cell carcinoma of skin of other parts of face: Secondary | ICD-10-CM | POA: Diagnosis not present

## 2024-01-26 ENCOUNTER — Encounter: Payer: Self-pay | Admitting: Gastroenterology

## 2024-01-27 DIAGNOSIS — C44519 Basal cell carcinoma of skin of other part of trunk: Secondary | ICD-10-CM | POA: Diagnosis not present

## 2024-02-04 ENCOUNTER — Encounter: Payer: Self-pay | Admitting: Gastroenterology

## 2024-02-15 DIAGNOSIS — M159 Polyosteoarthritis, unspecified: Secondary | ICD-10-CM | POA: Diagnosis not present

## 2024-02-15 DIAGNOSIS — I10 Essential (primary) hypertension: Secondary | ICD-10-CM | POA: Diagnosis not present

## 2024-02-15 DIAGNOSIS — F325 Major depressive disorder, single episode, in full remission: Secondary | ICD-10-CM | POA: Diagnosis not present

## 2024-02-15 DIAGNOSIS — Z125 Encounter for screening for malignant neoplasm of prostate: Secondary | ICD-10-CM | POA: Diagnosis not present

## 2024-02-15 DIAGNOSIS — R799 Abnormal finding of blood chemistry, unspecified: Secondary | ICD-10-CM | POA: Diagnosis not present

## 2024-02-16 ENCOUNTER — Encounter: Payer: Self-pay | Admitting: Anesthesiology

## 2024-02-16 ENCOUNTER — Ambulatory Visit: Attending: Anesthesiology | Admitting: Anesthesiology

## 2024-02-16 DIAGNOSIS — M545 Low back pain, unspecified: Secondary | ICD-10-CM

## 2024-02-16 DIAGNOSIS — K5903 Drug induced constipation: Secondary | ICD-10-CM

## 2024-02-16 DIAGNOSIS — M47816 Spondylosis without myelopathy or radiculopathy, lumbar region: Secondary | ICD-10-CM

## 2024-02-16 DIAGNOSIS — M79642 Pain in left hand: Secondary | ICD-10-CM

## 2024-02-16 DIAGNOSIS — G894 Chronic pain syndrome: Secondary | ICD-10-CM

## 2024-02-16 DIAGNOSIS — M5136 Other intervertebral disc degeneration, lumbar region with discogenic back pain only: Secondary | ICD-10-CM

## 2024-02-16 DIAGNOSIS — M79641 Pain in right hand: Secondary | ICD-10-CM | POA: Diagnosis not present

## 2024-02-16 DIAGNOSIS — M170 Bilateral primary osteoarthritis of knee: Secondary | ICD-10-CM | POA: Diagnosis not present

## 2024-02-16 DIAGNOSIS — T402X5A Adverse effect of other opioids, initial encounter: Secondary | ICD-10-CM | POA: Diagnosis not present

## 2024-02-16 DIAGNOSIS — F1190 Opioid use, unspecified, uncomplicated: Secondary | ICD-10-CM

## 2024-02-16 MED ORDER — MORPHINE SULFATE ER 30 MG PO TBCR: 30.0000 mg | EXTENDED_RELEASE_TABLET | Freq: Two times a day (BID) | ORAL | 0 refills | Status: AC

## 2024-02-16 MED ORDER — MORPHINE SULFATE ER 30 MG PO TBCR: 30.0000 mg | EXTENDED_RELEASE_TABLET | Freq: Two times a day (BID) | ORAL | 0 refills | Status: DC

## 2024-02-16 NOTE — Progress Notes (Signed)
 Virtual Visit via Telephone Note  I connected with Tanner Baker on 02/16/24 at  4:20 PM EDT by telephone and verified that I am speaking with the correct person using two identifiers.  Location: Patient: Home Provider: [facility] control center   I discussed the limitations, risks, security and privacy concerns of performing an evaluation and management service by telephone and the availability of in person appointments. I also discussed with the patient that there may be a patient responsible charge related to this service. The patient expressed understanding and agreed to proceed.   History of Present Illness: I spoke with Tanner Baker via telephone as we were unable to link for the video portion encounters.  He reports that he is doing better on the slow release morphine  30 mg twice a day.  At one point he was having difficulty finding his medication at the pharmacy but he is working with [facility] and that is better now.  Furthermore his constipation has eased up significantly and this is less of an issue.  He still has to use high fiber and some other medications to help with this but he reports having success.  In regards to his low back pain hand pain and hip pain, these have been stable with no change in quality characteristic or distribution.  He is able to be active with his [facility]  and fulfill daily activities.  Without the medication he is sedentary and unable to get out and about.  The quality of his pain is stable and no change in lower extremity strength function bowel or bladder function is noted beyond the occasional constipation.  Review of systems: General: No fevers or chills Pulmonary: No shortness of breath or dyspnea Cardiac: No angina or palpitations or lightheadedness GI: No abdominal pain or constipation Psych: No depression    Observations/Objective:  Current Outpatient Medications:    [START ON 02/19/2024] morphine  (MS CONTIN ) 30 MG 12 hr tablet, Take 1 tablet (30 mg  total) by mouth every 12 (twelve) hours., Disp: 60 tablet, Rfl: 0   [START ON 03/20/2024] morphine  (MS CONTIN ) 30 MG 12 hr tablet, Take 1 tablet (30 mg total) by mouth every 12 (twelve) hours., Disp: 60 tablet, Rfl: 0   acetaminophen  (TYLENOL ) 325 MG tablet, Take 2 tablets (650 mg total) by mouth every 6 (six) hours as needed for mild pain (pain score 1-3), moderate pain (pain score 4-6), fever or headache., Disp: , Rfl:    bisacodyl  (DULCOLAX) 5 MG EC tablet, Take 2 tablets (10 mg total) by mouth at bedtime., Disp: , Rfl:    diclofenac Sodium (VOLTAREN) 1 % GEL, Apply topically., Disp: , Rfl:    famotidine  (PEPCID ) 20 MG tablet, Take 20 mg by mouth 2 (two) times daily., Disp: , Rfl:    fluticasone  (FLONASE ) 50 MCG/ACT nasal spray, Place 1 spray into both nostrils daily as needed. Reported on 03/21/2016, Disp: , Rfl:    gabapentin  (NEURONTIN ) 100 MG capsule, Take 1 capsule (100 mg total) by mouth 3 (three) times daily., Disp: 90 capsule, Rfl: 3   gabapentin  (NEURONTIN ) 100 MG capsule, Take 1 capsule (100 mg total) by mouth 3 (three) times daily., Disp: 120 capsule, Rfl: 4   iron  polysaccharides (NIFEREX) 150 MG capsule, Take 1 capsule (150 mg total) by mouth daily., Disp: , Rfl:    linaclotide  (LINZESS ) 72 MCG capsule, Take 1 capsule (72 mcg total) by mouth daily before breakfast., Disp: 30 capsule, Rfl: 1   Multiple Vitamins-Iron  (MULTIVITAMINS WITH IRON ) TABS tablet, Take  1 tablet by mouth daily., Disp: , Rfl:    naloxone (NARCAN) nasal spray 4 mg/0.1 mL, SMARTSIG:Both Nares, Disp: , Rfl:    ondansetron  (ZOFRAN ) 4 MG tablet, Take 1 tablet by mouth every 6 (six) hours as needed., Disp: , Rfl:    PARoxetine  (PAXIL ) 30 MG tablet, Take 1 tablet by mouth daily., Disp: , Rfl:    polyethylene glycol (MIRALAX  / GLYCOLAX ) 17 g packet, Take 17 g by mouth 2 (two) times daily., Disp: , Rfl:    SUMAtriptan  (IMITREX ) 100 MG tablet, Take 100 mg by mouth once. May repeat in 2 hours if headache persists or recurs.,  Disp: , Rfl:    tamsulosin  (FLOMAX ) 0.4 MG CAPS capsule, Take 1 capsule (0.4 mg total) by mouth daily., Disp: 30 capsule, Rfl: 11   topiramate  (TOPAMAX ) 50 MG tablet, Take 50 mg by mouth 2 (two) times daily., Disp: , Rfl:    traZODone  (DESYREL ) 50 MG tablet, Take 100 mg by mouth at bedtime as needed for sleep. Take 1-2 tablets at bedtime as needed, Disp: , Rfl:    Past Medical History:  Diagnosis Date   Anxiety    Cancer (HCC)    skin cancer   Chronic low back pain 05/09/2021   Chronic pain syndrome    Chronic, continuous use of opioids 05/09/2021   DDD (degenerative disc disease), cervical    Depression    Depression    Glaucoma    Heartburn    Hypertension    Kidney stone    Kidney stones    Knee pain, bilateral 05/09/2021   Migraine    Migraine    Post-traumatic headache    Sleep apnea    Traumatic arthritis    Assessment and Plan:  1. Chronic bilateral low back pain without sciatica   2. Degeneration of intervertebral disc of lumbar region with discogenic back pain   3. Primary osteoarthritis of both knees   4. Chronic pain syndrome   5. Facet syndrome, lumbar   6. Pain in both hands   7. Chronic, continuous use of opioids   8. Opioid-induced constipation    Based on our conversation and after review of the [facility]  practitioner database information it is appropriate to refill his medicines for the next 2 months.  This be dated for June 20 and July 20.  No other changes in his pharmacologic regimen will be initiated.  Have encouraged him to continue with stretching strengthening exercises and stay active especially with the warmer weather.  Continue with his current dietary changes to help with the constipation and should this become a bigger issue, he should reach out to us  and let us  know for other possible modification.  Fortunately at present this is working well for him.  Continue follow-up with his primary care physician for baseline medical care with scheduled  return to clinic in 2 months. Follow Up Instructions:    I discussed the assessment and treatment plan with the patient. The patient was provided an opportunity to ask questions and all were answered. The patient agreed with the plan and demonstrated an understanding of the instructions.   The patient was advised to call back or seek an in-person evaluation if the symptoms worsen or if the condition fails to improve as anticipated.  I provided 30 minutes of non-face-to-face time during this encounter.   Zula Hitch, MD follow

## 2024-02-22 ENCOUNTER — Ambulatory Visit: Admitting: Anesthesiology

## 2024-02-22 ENCOUNTER — Encounter: Payer: Self-pay | Admitting: Gastroenterology

## 2024-02-22 ENCOUNTER — Ambulatory Visit
Admission: RE | Admit: 2024-02-22 | Discharge: 2024-02-22 | Disposition: A | Discharge status: Home / Self Care | Attending: Gastroenterology | Admitting: Gastroenterology

## 2024-02-22 ENCOUNTER — Encounter
Admission: RE | Disposition: A | Discharge status: Home / Self Care | Payer: Self-pay | Source: Home / Self Care | Attending: Gastroenterology

## 2024-02-22 DIAGNOSIS — G894 Chronic pain syndrome: Secondary | ICD-10-CM | POA: Insufficient documentation

## 2024-02-22 DIAGNOSIS — Z1211 Encounter for screening for malignant neoplasm of colon: Secondary | ICD-10-CM | POA: Diagnosis not present

## 2024-02-22 DIAGNOSIS — Z860101 Personal history of adenomatous and serrated colon polyps: Secondary | ICD-10-CM | POA: Diagnosis not present

## 2024-02-22 DIAGNOSIS — G4730 Sleep apnea, unspecified: Secondary | ICD-10-CM | POA: Diagnosis not present

## 2024-02-22 DIAGNOSIS — I10 Essential (primary) hypertension: Secondary | ICD-10-CM | POA: Insufficient documentation

## 2024-02-22 DIAGNOSIS — K635 Polyp of colon: Secondary | ICD-10-CM | POA: Insufficient documentation

## 2024-02-22 DIAGNOSIS — Z87891 Personal history of nicotine dependence: Secondary | ICD-10-CM | POA: Insufficient documentation

## 2024-02-22 DIAGNOSIS — Z09 Encounter for follow-up examination after completed treatment for conditions other than malignant neoplasm: Secondary | ICD-10-CM | POA: Diagnosis not present

## 2024-02-22 HISTORY — PX: POLYPECTOMY: SHX149

## 2024-02-22 HISTORY — DX: Personal history of urinary calculi: Z87.442

## 2024-02-22 HISTORY — PX: COLONOSCOPY: SHX5424

## 2024-02-22 SURGERY — COLONOSCOPY
Anesthesia: General

## 2024-02-22 MED ORDER — DEXMEDETOMIDINE HCL IN NACL 80 MCG/20ML IV SOLN
INTRAVENOUS | Status: DC | PRN
  Administered 2024-02-22: 20 ug via INTRAVENOUS

## 2024-02-22 MED ORDER — PROPOFOL 10 MG/ML IV BOLUS
INTRAVENOUS | Status: DC | PRN
  Administered 2024-02-22: 20 mg via INTRAVENOUS
  Administered 2024-02-22: 40 mg via INTRAVENOUS

## 2024-02-22 MED ORDER — LIDOCAINE HCL (PF) 2 % IJ SOLN
INTRAMUSCULAR | Status: AC
  Filled 2024-02-22: qty 5

## 2024-02-22 MED ORDER — LIDOCAINE HCL (CARDIAC) PF 100 MG/5ML IV SOSY
PREFILLED_SYRINGE | INTRAVENOUS | Status: DC | PRN
Start: 2024-02-22 — End: 2024-02-22
  Administered 2024-02-22: 60 mg via INTRAVENOUS

## 2024-02-22 MED ORDER — SODIUM CHLORIDE 0.9 % IV SOLN
INTRAVENOUS | Status: DC
Start: 2024-02-22 — End: 2024-02-22
  Administered 2024-02-22: 1000 mL via INTRAVENOUS

## 2024-02-22 MED ORDER — PROPOFOL 500 MG/50ML IV EMUL
INTRAVENOUS | Status: DC | PRN
  Administered 2024-02-22: 75 ug/kg/min via INTRAVENOUS

## 2024-02-22 NOTE — H&P (Signed)
 Pre-Procedure H&P   Patient ID: Tanner Baker is a 73 y.o. male.  Gastroenterology Provider: Elspeth Ozell Jungling, DO  Referring Provider: Dr. Lenon PCP: Lenon Layman ORN, MD  Date: 02/22/2024  HPI Tanner Baker is a 73 y.o. male who presents today for Colonoscopy for Personal history of colon polyps .  Last colonoscopy in 2020 with patient reported polyps.  No family history of colon cancer or colon polyps  Reports weekly BM with no melena or hematochezia.  Constipation related to opioid induced.  Hemoglobin 10 MCV 97.5 platelets 263,000   Past Medical History:  Diagnosis Date   Anxiety    Cancer (HCC)    skin cancer   Chronic low back pain 05/09/2021   Chronic pain syndrome    Chronic, continuous use of opioids 05/09/2021   DDD (degenerative disc disease), cervical    Depression    Depression    Glaucoma    Heartburn    History of kidney stones    Hypertension    Knee pain, bilateral 05/09/2021   Migraine    Migraine    Post-traumatic headache    Sleep apnea    Traumatic arthritis     Past Surgical History:  Procedure Laterality Date   arm surg Right 09/01/1993   COSMETIC SURGERY  09/01/1992   all of teeth   FRACTURE SURGERY     INTRAMEDULLARY (IM) NAIL INTERTROCHANTERIC Right 11/27/2020   Procedure: INTRAMEDULLARY (IM) NAIL INTERTROCHANTRIC;  Surgeon: Tobie Priest, MD;  Location: [facility] ORS;  Service: Orthopedics;  Laterality: Right;   SKIN CANCER EXCISION     TONSILLECTOMY     WISDOM TOOTH EXTRACTION      Family History No h/o GI disease or malignancy  Review of Systems  Constitutional:  Negative for activity change, appetite change, chills, diaphoresis, fatigue, fever and unexpected weight change.  HENT:  Negative for trouble swallowing and voice change.   Respiratory:  Negative for shortness of breath and wheezing.   Cardiovascular:  Negative for chest pain, palpitations and leg swelling.  Gastrointestinal:  Positive for  constipation. Negative for abdominal distention, abdominal pain, anal bleeding, blood in stool, diarrhea, nausea and vomiting.  Musculoskeletal:  Negative for arthralgias and myalgias.  Skin:  Negative for color change and pallor.  Neurological:  Negative for dizziness, syncope and weakness.  Psychiatric/Behavioral:  Negative for confusion. The patient is not nervous/anxious.   All other systems reviewed and are negative.    Medications No current facility-administered medications on file prior to encounter.   Current Outpatient Medications on File Prior to Encounter  Medication Sig Dispense Refill   acetaminophen  (TYLENOL ) 325 MG tablet Take 2 tablets (650 mg total) by mouth every 6 (six) hours as needed for mild pain (pain score 1-3), moderate pain (pain score 4-6), fever or headache.     bisacodyl  (DULCOLAX) 5 MG EC tablet Take 2 tablets (10 mg total) by mouth at bedtime.     diclofenac Sodium (VOLTAREN) 1 % GEL Apply topically.     famotidine  (PEPCID ) 20 MG tablet Take 20 mg by mouth 2 (two) times daily.     fluticasone  (FLONASE ) 50 MCG/ACT nasal spray Place 1 spray into both nostrils daily as needed. Reported on 03/21/2016     gabapentin  (NEURONTIN ) 100 MG capsule Take 1 capsule (100 mg total) by mouth 3 (three) times daily. 120 capsule 4   Multiple Vitamins-Iron  (MULTIVITAMINS WITH IRON ) TABS tablet Take 1 tablet by mouth daily.     ondansetron  (ZOFRAN ) 4 MG  tablet Take 1 tablet by mouth every 6 (six) hours as needed.     PARoxetine  (PAXIL ) 30 MG tablet Take 1 tablet by mouth daily.     polyethylene glycol (MIRALAX  / GLYCOLAX ) 17 g packet Take 17 g by mouth 2 (two) times daily.     SUMAtriptan  (IMITREX ) 100 MG tablet Take 100 mg by mouth once. May repeat in 2 hours if headache persists or recurs.     tamsulosin  (FLOMAX ) 0.4 MG CAPS capsule Take 1 capsule (0.4 mg total) by mouth daily. 30 capsule 11   topiramate  (TOPAMAX ) 50 MG tablet Take 50 mg by mouth 2 (two) times daily.      traZODone  (DESYREL ) 50 MG tablet Take 100 mg by mouth at bedtime as needed for sleep. Take 1-2 tablets at bedtime as needed     gabapentin  (NEURONTIN ) 100 MG capsule Take 1 capsule (100 mg total) by mouth 3 (three) times daily. 90 capsule 3   iron  polysaccharides (NIFEREX) 150 MG capsule Take 1 capsule (150 mg total) by mouth daily.     linaclotide  (LINZESS ) 72 MCG capsule Take 1 capsule (72 mcg total) by mouth daily before breakfast. 30 capsule 1   naloxone (NARCAN) nasal spray 4 mg/0.1 mL SMARTSIG:Both Nares      Pertinent medications related to GI and procedure were reviewed by me with the patient prior to the procedure  No current facility-administered medications for this encounter.      Allergies  Allergen Reactions   Doxycycline Rash   Allergies were reviewed by me prior to the procedure  Objective   Body mass index is 24.37 kg/m. Vitals:   02/22/24 0743  BP: (!) 143/75  Pulse: 63  Resp: 18  Temp: (!) 97.5 F (36.4 C)  TempSrc: Temporal  SpO2: 100%  Weight: 74.8 kg  Height: 5' 9 (1.753 m)     Physical Exam Vitals and nursing note reviewed.  Constitutional:      General: He is not in acute distress.    Appearance: Normal appearance. He is not ill-appearing, toxic-appearing or diaphoretic.  HENT:     Head: Normocephalic and atraumatic.     Nose: Nose normal.     Mouth/Throat:     Mouth: Mucous membranes are moist.     Pharynx: Oropharynx is clear.   Eyes:     General: No scleral icterus.    Extraocular Movements: Extraocular movements intact.    Cardiovascular:     Rate and Rhythm: Normal rate and regular rhythm.     Heart sounds: Normal heart sounds. No murmur heard.    No friction rub. No gallop.  Pulmonary:     Effort: Pulmonary effort is normal. No respiratory distress.     Breath sounds: Normal breath sounds. No wheezing, rhonchi or rales.  Abdominal:     General: Bowel sounds are normal. There is no distension.     Palpations: Abdomen is  soft.     Tenderness: There is no abdominal tenderness. There is no guarding or rebound.   Musculoskeletal:     Cervical back: Neck supple.     Right lower leg: No edema.     Left lower leg: No edema.   Skin:    General: Skin is warm and dry.     Coloration: Skin is not jaundiced or pale.   Neurological:     General: No focal deficit present.     Mental Status: He is alert and oriented to person, place, and time. Mental status is  at baseline.   Psychiatric:        Mood and Affect: Mood normal.        Behavior: Behavior normal.        Thought Content: Thought content normal.        Judgment: Judgment normal.      Assessment:  Tanner Baker is a 73 y.o. male  who presents today for Colonoscopy for Personal history of colon polyps .  Plan:  Colonoscopy with possible intervention today  Colonoscopy with possible biopsy, control of bleeding, polypectomy, and interventions as necessary has been discussed with the patient/patient representative. Informed consent was obtained from the patient/patient representative after explaining the indication, nature, and risks of the procedure including but not limited to death, bleeding, perforation, missed neoplasm/lesions, cardiorespiratory compromise, and reaction to medications. Opportunity for questions was given and appropriate answers were provided. Patient/patient representative has verbalized understanding is amenable to undergoing the procedure.   Elspeth Ozell Jungling, DO  [facility]  Portions of the record may have been created with voice recognition software. Occasional wrong-word or 'sound-a-like' substitutions may have occurred due to the inherent limitations of voice recognition software.  Read the chart carefully and recognize, using context, where substitutions may have occurred.

## 2024-02-22 NOTE — Transfer of Care (Signed)
 Immediate Anesthesia Transfer of Care Note  Patient: Tanner Baker  Procedure(s) Performed: COLONOSCOPY POLYPECTOMY, INTESTINE  Patient Location: PACU  Anesthesia Type:General  Level of Consciousness: sedated  Airway & Oxygen Therapy: Patient Spontanous Breathing  Post-op Assessment: Report given to RN and Post -op Vital signs reviewed and stable  Post vital signs: Reviewed and stable  Last Vitals:  Vitals Value Taken Time  BP 118/96 02/22/24 08:35  Temp    Pulse 58 02/22/24 08:36  Resp 15 02/22/24 08:36  SpO2 98 % 02/22/24 08:36  Vitals shown include unfiled device data.  Last Pain:  Vitals:   02/22/24 0743  TempSrc: Temporal  PainSc: 0-No pain         Complications: No notable events documented.

## 2024-02-22 NOTE — Op Note (Signed)
 [facility] Gastroenterology Patient Name: Tanner Baker Procedure Date: 02/22/2024 8:04 AM MRN: [id number] Account #: [id number] Date of Birth: 12/05/1950 Admit Type: Outpatient Age: 73 Room: [facility] ENDO ROOM 1 Gender: Male Note Status: Supervisor Override Instrument Name: [id number] Procedure:             Colonoscopy Indications:           High risk colon cancer surveillance: Personal history                         of colonic polyps Providers:             Elspeth Ozell Onita ROSALEA, DO Referring MD:          Layman ORN. Lenon MD, MD (Referring MD) Medicines:             Monitored Anesthesia Care Complications:         No immediate complications. Estimated blood loss:                         Minimal. Procedure:             Pre-Anesthesia Assessment:                        - Prior to the procedure, a History and Physical was                         performed, and patient medications and allergies were                         reviewed. The patient is competent. The risks and                         benefits of the procedure and the sedation options and                         risks were discussed with the patient. All questions                         were answered and informed consent was obtained.                         Patient identification and proposed procedure were                         verified by the physician, the nurse, the anesthetist                         and the technician in the endoscopy suite. Mental                         Status Examination: alert and oriented. Airway                         Examination: normal oropharyngeal airway and neck                         mobility. Respiratory Examination: clear to  auscultation. CV Examination: RRR, no murmurs, no S3                         or S4. Prophylactic Antibiotics: The patient does not                         require prophylactic antibiotics. Prior                          Anticoagulants: The patient has taken no anticoagulant                         or antiplatelet agents. ASA Grade Assessment: II - A                         patient with mild systemic disease. After reviewing                         the risks and benefits, the patient was deemed in                         satisfactory condition to undergo the procedure. The                         anesthesia plan was to use monitored anesthesia care                         (MAC). Immediately prior to administration of                         medications, the patient was re-assessed for adequacy                         to receive sedatives. The heart rate, respiratory                         rate, oxygen saturations, blood pressure, adequacy of                         pulmonary ventilation, and response to care were                         monitored throughout the procedure. The physical                         status of the patient was re-assessed after the                         procedure.                        After obtaining informed consent, the colonoscope was                         passed under direct vision. Throughout the procedure,                         the patient's blood pressure, pulse, and oxygen  saturations were monitored continuously. The                         Colonoscope was introduced through the anus and                         advanced to the the cecum, identified by appendiceal                         orifice and ileocecal valve. The colonoscopy was                         performed without difficulty. The patient tolerated                         the procedure well. The quality of the bowel                         preparation was evaluated using the BBPS [facility] Bowel                         Preparation Scale) with scores of: Right Colon = 3,                         Transverse Colon = 3 and Left Colon = 3 (entire mucosa                          seen well with no residual staining, small fragments                         of stool or opaque liquid). The total BBPS score                         equals 9. The ileocecal valve, appendiceal orifice,                         and rectum were photographed. Findings:      The perianal and digital rectal examinations were normal. Pertinent       negatives include normal sphincter tone.      Two sessile polyps were found in the ascending colon. The polyps were 1       to 2 mm in size. These polyps were removed with a jumbo cold forceps.       Resection and retrieval were complete. Estimated blood loss was minimal.      The exam was otherwise without abnormality on direct and retroflexion       views. Impression:            - Two 1 to 2 mm polyps in the ascending colon, removed                         with a jumbo cold forceps. Resected and retrieved.                        - The examination was otherwise normal on direct and  retroflexion views. Recommendation:        - Patient has a contact number available for                         emergencies. The signs and symptoms of potential                         delayed complications were discussed with the patient.                         Return to normal activities tomorrow. Written                         discharge instructions were provided to the patient.                        - Discharge patient to home.                        - Resume previous diet.                        - Continue present medications.                        - Await pathology results.                        - Repeat colonoscopy for surveillance based on                         pathology results.                        - Return to referring physician as previously                         scheduled.                        - The findings and recommendations were discussed with                         the patient. Procedure Code(s):     ---  Professional ---                        [phone], Colonoscopy, flexible; with biopsy, single or                         multiple Diagnosis Code(s):     --- Professional ---                        Z86.010, Personal history of colonic polyps                        D12.2, Benign neoplasm of ascending colon CPT copyright 2022 American Medical Association. All rights reserved. The codes documented in this report are preliminary and upon coder review may  be revised to meet current compliance requirements. Attending Participation:      I personally performed the entire procedure. Elspeth Jungling, DO Elspeth Ozell Jungling DO,  DO 02/22/2024 8:35:50 AM This report has been signed electronically. Number of Addenda: 0 Note Initiated On: 02/22/2024 8:04 AM Scope Withdrawal Time: 0 hours 9 minutes 50 seconds  Total Procedure Duration: 0 hours 13 minutes 5 seconds  Estimated Blood Loss:  Estimated blood loss was minimal.      [facility]

## 2024-02-22 NOTE — Interval H&P Note (Signed)
 History and Physical Interval Note: Preprocedure H&P from 02/22/24  was reviewed and there was no interval change after seeing and examining the patient.  Written consent was obtained from the patient after discussion of risks, benefits, and alternatives. Patient has consented to proceed with Colonoscopy with possible intervention   02/22/2024 8:09 AM  Tanner Baker  has presented today for surgery, with the diagnosis of Personal history of adenomatous and serrated colon polyps (Z86.0101).  The various methods of treatment have been discussed with the patient and family. After consideration of risks, benefits and other options for treatment, the patient has consented to  Procedure(s): COLONOSCOPY (N/A) as a surgical intervention.  The patient's history has been reviewed, patient examined, no change in status, stable for surgery.  I have reviewed the patient's chart and labs.  Questions were answered to the patient's satisfaction.     Elspeth Ozell Jungling

## 2024-02-22 NOTE — Anesthesia Postprocedure Evaluation (Signed)
 Anesthesia Post Note  Patient: Tanner Baker  Procedure(s) Performed: COLONOSCOPY POLYPECTOMY, INTESTINE  Patient location during evaluation: Endoscopy Anesthesia Type: General Level of consciousness: awake and alert Pain management: pain level controlled Vital Signs Assessment: post-procedure vital signs reviewed and stable Respiratory status: spontaneous breathing, nonlabored ventilation, respiratory function stable and patient connected to nasal cannula oxygen Cardiovascular status: blood pressure returned to baseline and stable Postop Assessment: no apparent nausea or vomiting Anesthetic complications: no   No notable events documented.   Last Vitals:  Vitals:   02/22/24 0845 02/22/24 0855  BP: 113/67 117/88  Pulse: (!) 56 (!) 58  Resp: 15 15  Temp:    SpO2: 98% 100%    Last Pain:  Vitals:   02/22/24 0855  TempSrc:   PainSc: 0-No pain                 [place] Ade

## 2024-02-22 NOTE — Anesthesia Preprocedure Evaluation (Signed)
 Anesthesia Evaluation  Patient identified by MRN, date of birth, ID band Patient awake    Reviewed: Allergy & Precautions, NPO status , Patient's Chart, lab work & pertinent test results  History of Anesthesia Complications Negative for: history of anesthetic complications  Airway Mallampati: II  TM Distance: >3 FB Neck ROM: Full    Dental  (+) Upper Dentures, Edentulous Lower   Pulmonary neg pulmonary ROS, neg sleep apnea, neg COPD, Patient abstained from smoking.Not current smoker, former smoker Prior OSA , lost weight since   Pulmonary exam normal breath sounds clear to auscultation       Cardiovascular Exercise Tolerance: Good METShypertension, Pt. on medications (-) CAD and (-) Past MI (-) dysrhythmias  Rhythm:Regular Rate:Normal - Systolic murmurs    Neuro/Psych  Headaches PSYCHIATRIC DISORDERS Anxiety Depression       GI/Hepatic ,neg GERD  ,,(+)     (-) substance abuse    Endo/Other  neg diabetes    Renal/GU negative Renal ROS     Musculoskeletal   Abdominal   Peds  Hematology   Anesthesia Other Findings Past Medical History: No date: Anxiety No date: Cancer [facility])     Comment:  skin cancer 05/09/2021: Chronic low back pain No date: Chronic pain syndrome 05/09/2021: Chronic, continuous use of opioids No date: DDD (degenerative disc disease), cervical No date: Depression No date: Depression No date: Glaucoma No date: Heartburn No date: History of kidney stones No date: Hypertension 05/09/2021: Knee pain, bilateral No date: Migraine No date: Migraine No date: Post-traumatic headache No date: Sleep apnea No date: Traumatic arthritis  Reproductive/Obstetrics                             Anesthesia Physical Anesthesia Plan  ASA: 2  Anesthesia Plan: General   Post-op Pain Management: Minimal or no pain anticipated   Induction: Intravenous  PONV Risk Score and Plan:  2 and Propofol  infusion, TIVA and Ondansetron   Airway Management Planned: Nasal Cannula  Additional Equipment: None  Intra-op Plan:   Post-operative Plan:   Informed Consent: I have reviewed the patients History and Physical, chart, labs and discussed the procedure including the risks, benefits and alternatives for the proposed anesthesia with the patient or authorized representative who has indicated his/her understanding and acceptance.     Dental advisory given  Plan Discussed with: CRNA and Surgeon  Anesthesia Plan Comments: (Discussed risks of anesthesia with patient, including possibility of difficulty with spontaneous ventilation under anesthesia necessitating airway intervention, PONV, and rare risks such as cardiac or respiratory or neurological events, and allergic reactions. Discussed the role of CRNA in patient's perioperative care. Patient understands.)       Anesthesia Quick Evaluation

## 2024-02-24 LAB — SURGICAL PATHOLOGY

## 2024-02-29 DIAGNOSIS — Z0000 Encounter for general adult medical examination without abnormal findings: Secondary | ICD-10-CM | POA: Diagnosis not present

## 2024-02-29 DIAGNOSIS — F325 Major depressive disorder, single episode, in full remission: Secondary | ICD-10-CM | POA: Diagnosis not present

## 2024-02-29 DIAGNOSIS — G894 Chronic pain syndrome: Secondary | ICD-10-CM | POA: Diagnosis not present

## 2024-02-29 DIAGNOSIS — G43909 Migraine, unspecified, not intractable, without status migrainosus: Secondary | ICD-10-CM | POA: Diagnosis not present

## 2024-02-29 DIAGNOSIS — H9191 Unspecified hearing loss, right ear: Secondary | ICD-10-CM | POA: Diagnosis not present

## 2024-02-29 DIAGNOSIS — I10 Essential (primary) hypertension: Secondary | ICD-10-CM | POA: Diagnosis not present

## 2024-02-29 DIAGNOSIS — Z1331 Encounter for screening for depression: Secondary | ICD-10-CM | POA: Diagnosis not present

## 2024-03-15 DIAGNOSIS — H903 Sensorineural hearing loss, bilateral: Secondary | ICD-10-CM | POA: Diagnosis not present

## 2024-03-16 ENCOUNTER — Other Ambulatory Visit: Payer: Self-pay | Admitting: *Deleted

## 2024-03-16 ENCOUNTER — Telehealth: Payer: Self-pay | Admitting: Anesthesiology

## 2024-03-16 MED ORDER — MORPHINE SULFATE ER 30 MG PO TBCR: 30.0000 mg | EXTENDED_RELEASE_TABLET | Freq: Two times a day (BID) | ORAL | 0 refills | Status: DC

## 2024-03-16 NOTE — Telephone Encounter (Signed)
 Patient informed script sent to [facility], also instructed not to ever get a partial fill.

## 2024-03-16 NOTE — Telephone Encounter (Signed)
 PT called asked of morphine  prescription can be send to [facility] in [place] on [address]. [facility] stated that [facility] is out. Please give patient a call. PT stated that [facility] does have some in stock and patient is due to pick up on Sunday. TY

## 2024-03-16 NOTE — Telephone Encounter (Signed)
 Called [facility], confirmed that Morphine  30 mg ER is out of stock. Prescription cancelled. Rx request sent to [facility].

## 2024-04-05 ENCOUNTER — Encounter: Payer: Self-pay | Admitting: Anesthesiology

## 2024-04-05 ENCOUNTER — Ambulatory Visit: Attending: Anesthesiology | Admitting: Anesthesiology

## 2024-04-05 DIAGNOSIS — M170 Bilateral primary osteoarthritis of knee: Secondary | ICD-10-CM | POA: Diagnosis not present

## 2024-04-05 DIAGNOSIS — M47816 Spondylosis without myelopathy or radiculopathy, lumbar region: Secondary | ICD-10-CM

## 2024-04-05 DIAGNOSIS — M79641 Pain in right hand: Secondary | ICD-10-CM

## 2024-04-05 DIAGNOSIS — G894 Chronic pain syndrome: Secondary | ICD-10-CM | POA: Diagnosis not present

## 2024-04-05 DIAGNOSIS — Z79891 Long term (current) use of opiate analgesic: Secondary | ICD-10-CM

## 2024-04-05 DIAGNOSIS — M79642 Pain in left hand: Secondary | ICD-10-CM | POA: Diagnosis not present

## 2024-04-05 DIAGNOSIS — M5136 Other intervertebral disc degeneration, lumbar region with discogenic back pain only: Secondary | ICD-10-CM | POA: Diagnosis not present

## 2024-04-05 DIAGNOSIS — F1190 Opioid use, unspecified, uncomplicated: Secondary | ICD-10-CM

## 2024-04-05 DIAGNOSIS — G8929 Other chronic pain: Secondary | ICD-10-CM

## 2024-04-05 MED ORDER — MORPHINE SULFATE ER 30 MG PO TBCR: 30.0000 mg | EXTENDED_RELEASE_TABLET | Freq: Two times a day (BID) | ORAL | 0 refills | Status: DC

## 2024-04-05 MED ORDER — MORPHINE SULFATE ER 30 MG PO TBCR: 30.0000 mg | EXTENDED_RELEASE_TABLET | Freq: Two times a day (BID) | ORAL | 0 refills | Status: AC

## 2024-04-05 MED ORDER — CELECOXIB 200 MG PO CAPS: 200.0000 mg | ORAL_CAPSULE | Freq: Every day | ORAL | 2 refills | Status: AC

## 2024-04-05 MED ORDER — CELECOXIB 200 MG PO CAPS: 200.0000 mg | ORAL_CAPSULE | Freq: Every day | ORAL | 2 refills | Status: DC

## 2024-04-05 NOTE — Progress Notes (Signed)
 Virtual Visit via Telephone Note  I connected with Tanner Baker on 04/05/24 at  4:00 PM EDT by telephone and verified that I am speaking with the correct person using two identifiers.  Location: Patient: Home Provider: Pain control center   I discussed the limitations, risks, security and privacy concerns of performing an evaluation and management service by telephone and the availability of in person appointments. I also discussed with the patient that there may be a patient responsible charge related to this service. The patient expressed understanding and agreed to proceed.   History of Present Illness: I spoke with Tanner Baker via telephone as we are unable link with the video portion.  He reports that he is continuing to gain good relief from the extended release morphine  that he takes twice a day.  The quality characteristic and distribution of his pain is otherwise stable with no recent changes noted.  He still has pain in his low back neck bilateral hips and down his legs with some sciatica symptoms.  This is generally well-controlled by the morphine .  He also takes Voltaren gel for some shoulder pain but is questioning whether an oral preparation would help with anti-inflammatory and worked better with his long-acting opioids.  Otherwise he is in his usual state of health at this time.  Review of systems: General: No fevers or chills Pulmonary: No shortness of breath or dyspnea Cardiac: No angina or palpitations or lightheadedness GI: No abdominal pain or constipation Psych: No depression    Observations/Objective:  Current Outpatient Medications:    [START ON 05/19/2024] morphine  (MS CONTIN ) 30 MG 12 hr tablet, Take 1 tablet (30 mg total) by mouth every 12 (twelve) hours., Disp: 60 tablet, Rfl: 0   acetaminophen  (TYLENOL ) 325 MG tablet, Take 2 tablets (650 mg total) by mouth every 6 (six) hours as needed for mild pain (pain score 1-3), moderate pain (pain score 4-6), fever or  headache., Disp: , Rfl:    bisacodyl  (DULCOLAX) 5 MG EC tablet, Take 2 tablets (10 mg total) by mouth at bedtime., Disp: , Rfl:    celecoxib  (CELEBREX ) 200 MG capsule, Take 1 capsule (200 mg total) by mouth daily., Disp: 30 capsule, Rfl: 2   diclofenac Sodium (VOLTAREN) 1 % GEL, Apply topically., Disp: , Rfl:    famotidine  (PEPCID ) 20 MG tablet, Take 20 mg by mouth 2 (two) times daily., Disp: , Rfl:    fluticasone  (FLONASE ) 50 MCG/ACT nasal spray, Place 1 spray into both nostrils daily as needed. Reported on 03/21/2016, Disp: , Rfl:    gabapentin  (NEURONTIN ) 100 MG capsule, Take 1 capsule (100 mg total) by mouth 3 (three) times daily., Disp: 90 capsule, Rfl: 3   gabapentin  (NEURONTIN ) 100 MG capsule, Take 1 capsule (100 mg total) by mouth 3 (three) times daily., Disp: 120 capsule, Rfl: 4   iron  polysaccharides (NIFEREX) 150 MG capsule, Take 1 capsule (150 mg total) by mouth daily., Disp: , Rfl:    linaclotide  (LINZESS ) 72 MCG capsule, Take 1 capsule (72 mcg total) by mouth daily before breakfast., Disp: 30 capsule, Rfl: 1   [START ON 04/19/2024] morphine  (MS CONTIN ) 30 MG 12 hr tablet, Take 1 tablet (30 mg total) by mouth every 12 (twelve) hours., Disp: 60 tablet, Rfl: 0   Multiple Vitamins-Iron  (MULTIVITAMINS WITH IRON ) TABS tablet, Take 1 tablet by mouth daily., Disp: , Rfl:    naloxone (NARCAN) nasal spray 4 mg/0.1 mL, SMARTSIG:Both Nares, Disp: , Rfl:    ondansetron  (ZOFRAN ) 4 MG tablet, Take  1 tablet by mouth every 6 (six) hours as needed., Disp: , Rfl:    PARoxetine  (PAXIL ) 30 MG tablet, Take 1 tablet by mouth daily., Disp: , Rfl:    polyethylene glycol (MIRALAX  / GLYCOLAX ) 17 g packet, Take 17 g by mouth 2 (two) times daily., Disp: , Rfl:    SUMAtriptan  (IMITREX ) 100 MG tablet, Take 100 mg by mouth once. May repeat in 2 hours if headache persists or recurs., Disp: , Rfl:    tamsulosin  (FLOMAX ) 0.4 MG CAPS capsule, Take 1 capsule (0.4 mg total) by mouth daily., Disp: 30 capsule, Rfl: 11    topiramate  (TOPAMAX ) 50 MG tablet, Take 50 mg by mouth 2 (two) times daily., Disp: , Rfl:    traZODone  (DESYREL ) 50 MG tablet, Take 100 mg by mouth at bedtime as needed for sleep. Take 1-2 tablets at bedtime as needed, Disp: , Rfl:    Past Medical History:  Diagnosis Date   Anxiety    Cancer (HCC)    skin cancer   Chronic low back pain 05/09/2021   Chronic pain syndrome    Chronic, continuous use of opioids 05/09/2021   DDD (degenerative disc disease), cervical    Depression    Depression    Glaucoma    Heartburn    History of kidney stones    Hypertension    Knee pain, bilateral 05/09/2021   Migraine    Migraine    Post-traumatic headache    Sleep apnea    Traumatic arthritis    Assessment and Plan:  1. Chronic bilateral low back pain without sciatica   2. Degeneration of intervertebral disc of lumbar region with discogenic back pain   3. Primary osteoarthritis of both knees   4. Chronic pain syndrome   5. Facet syndrome, lumbar   6. Pain in both hands   7. Chronic, continuous use of opioids    Based on his conversation today and after review of the [facility]  practitioner database information I think is appropriate to refill his opioid medications.  He continues to derive good functional lifestyle improvement with the medicines whereas he has failed more conservative therapy.  He has no side effects with the medicines.  These will be called in for August 19 and September 18.  I have also requested that he stop the Voltaren gel and we will start him on Celebrex  200 mg once a day.  Continue core stretching strengthening exercises with return to clinic scheduled in 2 months and continue follow-up with his primary care physicians for baseline medical care. Follow Up Instructions:    I discussed the assessment and treatment plan with the patient. The patient was provided an opportunity to ask questions and all were answered. The patient agreed with the plan and demonstrated  an understanding of the instructions.   The patient was advised to call back or seek an in-person evaluation if the symptoms worsen or if the condition fails to improve as anticipated.  I provided 30 minutes of non-face-to-face time during this encounter.   Lynwood KANDICE Clause, MD

## 2024-04-29 DIAGNOSIS — C44619 Basal cell carcinoma of skin of left upper limb, including shoulder: Secondary | ICD-10-CM | POA: Diagnosis not present

## 2024-04-29 DIAGNOSIS — D2272 Melanocytic nevi of left lower limb, including hip: Secondary | ICD-10-CM | POA: Diagnosis not present

## 2024-04-29 DIAGNOSIS — Z08 Encounter for follow-up examination after completed treatment for malignant neoplasm: Secondary | ICD-10-CM | POA: Diagnosis not present

## 2024-04-29 DIAGNOSIS — L905 Scar conditions and fibrosis of skin: Secondary | ICD-10-CM | POA: Diagnosis not present

## 2024-04-29 DIAGNOSIS — C44519 Basal cell carcinoma of skin of other part of trunk: Secondary | ICD-10-CM | POA: Diagnosis not present

## 2024-04-29 DIAGNOSIS — L821 Other seborrheic keratosis: Secondary | ICD-10-CM | POA: Diagnosis not present

## 2024-04-29 DIAGNOSIS — C44612 Basal cell carcinoma of skin of right upper limb, including shoulder: Secondary | ICD-10-CM | POA: Diagnosis not present

## 2024-04-29 DIAGNOSIS — Z85828 Personal history of other malignant neoplasm of skin: Secondary | ICD-10-CM | POA: Diagnosis not present

## 2024-04-29 DIAGNOSIS — D485 Neoplasm of uncertain behavior of skin: Secondary | ICD-10-CM | POA: Diagnosis not present

## 2024-04-29 DIAGNOSIS — D2261 Melanocytic nevi of right upper limb, including shoulder: Secondary | ICD-10-CM | POA: Diagnosis not present

## 2024-04-29 DIAGNOSIS — D225 Melanocytic nevi of trunk: Secondary | ICD-10-CM | POA: Diagnosis not present

## 2024-04-29 DIAGNOSIS — D2262 Melanocytic nevi of left upper limb, including shoulder: Secondary | ICD-10-CM | POA: Diagnosis not present

## 2024-04-29 DIAGNOSIS — D2271 Melanocytic nevi of right lower limb, including hip: Secondary | ICD-10-CM | POA: Diagnosis not present

## 2024-05-09 DIAGNOSIS — R5383 Other fatigue: Secondary | ICD-10-CM | POA: Diagnosis not present

## 2024-05-09 DIAGNOSIS — G894 Chronic pain syndrome: Secondary | ICD-10-CM | POA: Diagnosis not present

## 2024-05-09 DIAGNOSIS — R319 Hematuria, unspecified: Secondary | ICD-10-CM | POA: Diagnosis not present

## 2024-05-09 DIAGNOSIS — F325 Major depressive disorder, single episode, in full remission: Secondary | ICD-10-CM | POA: Diagnosis not present

## 2024-05-09 DIAGNOSIS — G43909 Migraine, unspecified, not intractable, without status migrainosus: Secondary | ICD-10-CM | POA: Diagnosis not present

## 2024-05-09 DIAGNOSIS — I10 Essential (primary) hypertension: Secondary | ICD-10-CM | POA: Diagnosis not present

## 2024-05-10 DIAGNOSIS — Z1159 Encounter for screening for other viral diseases: Secondary | ICD-10-CM | POA: Diagnosis not present

## 2024-05-10 DIAGNOSIS — R7401 Elevation of levels of liver transaminase levels: Secondary | ICD-10-CM | POA: Diagnosis not present

## 2024-06-02 ENCOUNTER — Ambulatory Visit: Attending: Anesthesiology | Admitting: Anesthesiology

## 2024-06-02 ENCOUNTER — Encounter: Payer: Self-pay | Admitting: Anesthesiology

## 2024-06-02 VITALS — BP 149/72 | HR 50 | Resp 18 | Ht 69.0 in | Wt 165.0 lb

## 2024-06-02 DIAGNOSIS — Z79891 Long term (current) use of opiate analgesic: Secondary | ICD-10-CM | POA: Diagnosis not present

## 2024-06-02 DIAGNOSIS — M5136 Other intervertebral disc degeneration, lumbar region with discogenic back pain only: Secondary | ICD-10-CM | POA: Diagnosis not present

## 2024-06-02 DIAGNOSIS — G8929 Other chronic pain: Secondary | ICD-10-CM | POA: Diagnosis not present

## 2024-06-02 DIAGNOSIS — M170 Bilateral primary osteoarthritis of knee: Secondary | ICD-10-CM | POA: Insufficient documentation

## 2024-06-02 DIAGNOSIS — M545 Low back pain, unspecified: Secondary | ICD-10-CM | POA: Diagnosis not present

## 2024-06-02 DIAGNOSIS — F1190 Opioid use, unspecified, uncomplicated: Secondary | ICD-10-CM | POA: Diagnosis not present

## 2024-06-02 DIAGNOSIS — G894 Chronic pain syndrome: Secondary | ICD-10-CM | POA: Insufficient documentation

## 2024-06-02 MED ORDER — MORPHINE SULFATE ER 30 MG PO TBCR: 30.0000 mg | EXTENDED_RELEASE_TABLET | Freq: Two times a day (BID) | ORAL | 0 refills | Status: DC

## 2024-06-02 MED ORDER — MORPHINE SULFATE ER 30 MG PO TBCR: 30.0000 mg | EXTENDED_RELEASE_TABLET | Freq: Two times a day (BID) | ORAL | 0 refills | Status: AC

## 2024-06-02 NOTE — Progress Notes (Signed)
 Nursing Pain Medication Assessment:  Safety precautions to be maintained throughout the outpatient stay will include: orient to surroundings, keep bed in low position, maintain call bell within reach at all times, provide assistance with transfer out of bed and ambulation.  Medication Inspection Compliance: Pill count conducted under aseptic conditions, in front of the patient. Neither the pills nor the bottle was removed from the patient's sight at any time. Once count was completed pills were immediately returned to the patient in their original bottle.  Medication: Morphine  ER (MSContin) Pill/Patch Count: 29 of 60 pills/patches remain Pill/Patch Appearance: Markings consistent with prescribed medication Bottle Appearance: Standard pharmacy container. Clearly labeled. Filled Date: 08 / 19 / 2025 Last Medication intake:  Today

## 2024-06-02 NOTE — Addendum Note (Signed)
 Addended by: IGNACIO DELON BROCKS on: 06/02/2024 03:20 PM   Modules accepted: Orders

## 2024-06-02 NOTE — Progress Notes (Signed)
 Subjective:  Patient ID: Tanner Baker, male    DOB: 03-23-1951  Age: 73 y.o. MRN: [id number]  CC: Back Pain (low)   Procedure: None  HPI Hal Norrington presents for a new patient evaluation.  He was last seen 2 months ago and continues to do well with his current medication management.  He takes medications as prescribed and continues to get good relief rated about 75% on average lasting about 6 or 8 hours before he has recurrence of his baseline pain.  He is currently taking the extended release morphine  at 30 mg twice a day and this is working well for him.  He maintains that his constipation has improved.  He takes over-the-counter supplements for this as discussed with his pharmacist and this is working well.  No change in lower extremity strength function bowel or bladder function is noted at this time.  The quality characteristic distribution of his pain remains stable with no recent changes.  Outpatient Medications Prior to Visit  Medication Sig Dispense Refill   acetaminophen  (TYLENOL ) 325 MG tablet Take 2 tablets (650 mg total) by mouth every 6 (six) hours as needed for mild pain (pain score 1-3), moderate pain (pain score 4-6), fever or headache.     bisacodyl  (DULCOLAX) 5 MG EC tablet Take 2 tablets (10 mg total) by mouth at bedtime.     celecoxib  (CELEBREX ) 200 MG capsule Take 200 mg by mouth daily.     diclofenac Sodium (VOLTAREN) 1 % GEL Apply topically.     escitalopram (LEXAPRO) 10 MG tablet Take 10 mg by mouth daily.     famotidine  (PEPCID ) 20 MG tablet Take 20 mg by mouth 2 (two) times daily.     fluticasone  (FLONASE ) 50 MCG/ACT nasal spray Place 1 spray into both nostrils daily as needed. Reported on 03/21/2016     gabapentin  (NEURONTIN ) 100 MG capsule Take 1 capsule (100 mg total) by mouth 3 (three) times daily. 90 capsule 3   gabapentin  (NEURONTIN ) 100 MG capsule Take 1 capsule (100 mg total) by mouth 3 (three) times daily. 120 capsule 4   iron  polysaccharides (NIFEREX)  150 MG capsule Take 1 capsule (150 mg total) by mouth daily.     linaclotide  (LINZESS ) 72 MCG capsule Take 1 capsule (72 mcg total) by mouth daily before breakfast. 30 capsule 1   Multiple Vitamins-Iron  (MULTIVITAMINS WITH IRON ) TABS tablet Take 1 tablet by mouth daily.     naloxone (NARCAN) nasal spray 4 mg/0.1 mL SMARTSIG:Both Nares     ondansetron  (ZOFRAN ) 4 MG tablet Take 1 tablet by mouth every 6 (six) hours as needed.     polyethylene glycol (MIRALAX  / GLYCOLAX ) 17 g packet Take 17 g by mouth 2 (two) times daily.     propranolol  (INDERAL ) 20 MG tablet Take 20 mg by mouth 2 (two) times daily.     SUMAtriptan  (IMITREX ) 100 MG tablet Take 100 mg by mouth once. May repeat in 2 hours if headache persists or recurs.     tamsulosin  (FLOMAX ) 0.4 MG CAPS capsule Take 1 capsule (0.4 mg total) by mouth daily. 30 capsule 11   topiramate  (TOPAMAX ) 50 MG tablet Take 50 mg by mouth 2 (two) times daily.     traZODone  (DESYREL ) 50 MG tablet Take 100 mg by mouth at bedtime as needed for sleep. Take 1-2 tablets at bedtime as needed     morphine  (MS CONTIN ) 30 MG 12 hr tablet Take 1 tablet (30 mg total) by mouth every 12 (twelve)  hours. 60 tablet 0   PARoxetine  (PAXIL ) 30 MG tablet Take 1 tablet by mouth daily. (Patient not taking: Reported on 06/02/2024)     No facility-administered medications prior to visit.    Review of Systems CNS: No confusion or sedation Cardiac: No angina or palpitations GI: No abdominal pain or constipation Constitutional: No nausea vomiting fevers or chills  Objective:  BP (!) 149/72   Pulse (!) 50   Resp 18   Ht 5' 9 (1.753 m)   Wt 165 lb (74.8 kg)   SpO2 99%   BMI 24.37 kg/m    BP Readings from Last 3 Encounters:  06/02/24 (!) 149/72  02/22/24 117/88  08/17/23 131/81     Wt Readings from Last 3 Encounters:  06/02/24 165 lb (74.8 kg)  02/22/24 165 lb (74.8 kg)  08/09/23 163 lb 12.8 oz (74.3 kg)     Physical Exam Pt is alert and oriented PERRL  EOMI HEART IS RRR no murmur or rub LCTA no wheezing or rales MUSCULOSKELETAL reveals some paraspinous muscle tenderness but no overt trigger points at this time.  He is ambulating well.  Lower extremity muscle tone and bulk is at baseline.  Labs  No results found for: HGBA1C Lab Results  Component Value Date   CREATININE 0.89 08/17/2023    -------------------------------------------------------------------------------------------------------------------- Lab Results  Component Value Date   WBC 10.9 (H) 08/17/2023   HGB 10.0 (L) 08/17/2023   HCT 31.4 (L) 08/17/2023   PLT 263 08/17/2023   GLUCOSE 112 (H) 08/17/2023   ALT 30 08/09/2023   AST 36 08/09/2023   NA 139 08/17/2023   K 4.2 08/17/2023   CL 104 08/17/2023   CREATININE 0.89 08/17/2023   BUN 23 08/17/2023   CO2 27 08/17/2023   INR 1.1 11/26/2020    --------------------------------------------------------------------------------------------------------------------- No results found.   Assessment & Plan:   Javier was seen today for back pain.  Diagnoses and all orders for this visit:  Chronic bilateral low back pain without sciatica  Degeneration of intervertebral disc of lumbar region with discogenic back pain  Primary osteoarthritis of both knees  Chronic pain syndrome  Chronic, continuous use of opioids  Other orders -     morphine  (MS CONTIN ) 30 MG 12 hr tablet; Take 1 tablet (30 mg total) by mouth every 12 (twelve) hours. -     morphine  (MS CONTIN ) 30 MG 12 hr tablet; Take 1 tablet (30 mg total) by mouth every 12 (twelve) hours.        ----------------------------------------------------------------------------------------------------------------------  Problem List Items Addressed This Visit       Unprioritized   Chronic pain syndrome   Relevant Medications   escitalopram (LEXAPRO) 10 MG tablet   celecoxib  (CELEBREX ) 200 MG capsule   morphine  (MS CONTIN ) 30 MG 12 hr tablet (Start on  06/16/2024)   morphine  (MS CONTIN ) 30 MG 12 hr tablet (Start on 07/16/2024)   Chronic, continuous use of opioids   DDD (degenerative disc disease), lumbar   DJD (degenerative joint disease) of knee   Relevant Medications   celecoxib  (CELEBREX ) 200 MG capsule   morphine  (MS CONTIN ) 30 MG 12 hr tablet (Start on 06/16/2024)   morphine  (MS CONTIN ) 30 MG 12 hr tablet (Start on 07/16/2024)   Low back pain - Primary   Relevant Medications   celecoxib  (CELEBREX ) 200 MG capsule   morphine  (MS CONTIN ) 30 MG 12 hr tablet (Start on 06/16/2024)   morphine  (MS CONTIN ) 30 MG 12 hr tablet (Start on 07/16/2024)      ----------------------------------------------------------------------------------------------------------------------  1. Chronic bilateral low back pain without sciatica (Primary) Will continue with his current medication management.  He is doing well.  No other changes are initiated at this time.  Refills are scheduled for October 16 and November 15.  I have reviewed the   practitioner database information is appropriate for refill.  2. Degeneration of intervertebral disc of lumbar region with discogenic back pain As above  3. Primary osteoarthritis of both knees As above  4. Chronic pain syndrome As above  5. Chronic, continuous use of opioids Will schedule him for return to clinic in 2 months and continue follow-up with his primary care physician for baseline medical care.    ----------------------------------------------------------------------------------------------------------------------  I am having Darina Nett start on morphine . I am also having him maintain his traZODone , SUMAtriptan , fluticasone , diclofenac Sodium, topiramate , famotidine , naloxone, ondansetron , PARoxetine , tamsulosin , linaclotide , gabapentin , multivitamins with iron , acetaminophen , iron  polysaccharides, polyethylene glycol, bisacodyl , gabapentin , escitalopram, celecoxib , propranolol ,  and morphine .   Meds ordered this encounter  Medications   morphine  (MS CONTIN ) 30 MG 12 hr tablet    Sig: Take 1 tablet (30 mg total) by mouth every 12 (twelve) hours.    Dispense:  60 tablet    Refill:  0   morphine  (MS CONTIN ) 30 MG 12 hr tablet    Sig: Take 1 tablet (30 mg total) by mouth every 12 (twelve) hours.    Dispense:  60 tablet    Refill:  0   Patient's Medications  New Prescriptions   MORPHINE  (MS CONTIN ) 30 MG 12 HR TABLET    Take 1 tablet (30 mg total) by mouth every 12 (twelve) hours.  Previous Medications   ACETAMINOPHEN  (TYLENOL ) 325 MG TABLET    Take 2 tablets (650 mg total) by mouth every 6 (six) hours as needed for mild pain (pain score 1-3), moderate pain (pain score 4-6), fever or headache.   BISACODYL  (DULCOLAX) 5 MG EC TABLET    Take 2 tablets (10 mg total) by mouth at bedtime.   CELECOXIB  (CELEBREX ) 200 MG CAPSULE    Take 200 mg by mouth daily.   DICLOFENAC SODIUM (VOLTAREN) 1 % GEL    Apply topically.   ESCITALOPRAM (LEXAPRO) 10 MG TABLET    Take 10 mg by mouth daily.   FAMOTIDINE  (PEPCID ) 20 MG TABLET    Take 20 mg by mouth 2 (two) times daily.   FLUTICASONE  (FLONASE ) 50 MCG/ACT NASAL SPRAY    Place 1 spray into both nostrils daily as needed. Reported on 03/21/2016   GABAPENTIN  (NEURONTIN ) 100 MG CAPSULE    Take 1 capsule (100 mg total) by mouth 3 (three) times daily.   GABAPENTIN  (NEURONTIN ) 100 MG CAPSULE    Take 1 capsule (100 mg total) by mouth 3 (three) times daily.   IRON  POLYSACCHARIDES (NIFEREX) 150 MG CAPSULE    Take 1 capsule (150 mg total) by mouth daily.   LINACLOTIDE  (LINZESS ) 72 MCG CAPSULE    Take 1 capsule (72 mcg total) by mouth daily before breakfast.   MULTIPLE VITAMINS-IRON  (MULTIVITAMINS WITH IRON ) TABS TABLET    Take 1 tablet by mouth daily.   NALOXONE (NARCAN) NASAL SPRAY 4 MG/0.1 ML    SMARTSIG:Both Nares   ONDANSETRON  (ZOFRAN ) 4 MG TABLET    Take 1 tablet by mouth every 6 (six) hours as needed.   PAROXETINE  (PAXIL ) 30 MG TABLET     Take 1 tablet by mouth daily.   POLYETHYLENE GLYCOL (MIRALAX  / GLYCOLAX ) 17 G PACKET    Take 17  g by mouth 2 (two) times daily.   PROPRANOLOL  (INDERAL ) 20 MG TABLET    Take 20 mg by mouth 2 (two) times daily.   SUMATRIPTAN  (IMITREX ) 100 MG TABLET    Take 100 mg by mouth once. May repeat in 2 hours if headache persists or recurs.   TAMSULOSIN  (FLOMAX ) 0.4 MG CAPS CAPSULE    Take 1 capsule (0.4 mg total) by mouth daily.   TOPIRAMATE  (TOPAMAX ) 50 MG TABLET    Take 50 mg by mouth 2 (two) times daily.   TRAZODONE  (DESYREL ) 50 MG TABLET    Take 100 mg by mouth at bedtime as needed for sleep. Take 1-2 tablets at bedtime as needed  Modified Medications   Modified Medication Previous Medication   MORPHINE  (MS CONTIN ) 30 MG 12 HR TABLET morphine  (MS CONTIN ) 30 MG 12 hr tablet      Take 1 tablet (30 mg total) by mouth every 12 (twelve) hours.    Take 1 tablet (30 mg total) by mouth every 12 (twelve) hours.  Discontinued Medications   No medications on file   ----------------------------------------------------------------------------------------------------------------------  Follow-up: Return in about 2 months (around 08/02/2024) for evaluation, med refill.    Lynwood KANDICE Clause, MD

## 2024-06-06 LAB — TOXASSURE SELECT 13 (MW), URINE

## 2024-06-25 ENCOUNTER — Other Ambulatory Visit: Payer: Self-pay | Admitting: Anesthesiology

## 2024-06-29 DIAGNOSIS — C44519 Basal cell carcinoma of skin of other part of trunk: Secondary | ICD-10-CM | POA: Diagnosis not present

## 2024-06-29 DIAGNOSIS — L905 Scar conditions and fibrosis of skin: Secondary | ICD-10-CM | POA: Diagnosis not present

## 2024-06-30 ENCOUNTER — Other Ambulatory Visit: Payer: Self-pay | Admitting: *Deleted

## 2024-06-30 ENCOUNTER — Telehealth: Payer: Self-pay | Admitting: Anesthesiology

## 2024-06-30 MED ORDER — CELECOXIB 200 MG PO CAPS: 200.0000 mg | ORAL_CAPSULE | Freq: Every day | ORAL | 2 refills | Status: DC

## 2024-06-30 NOTE — Telephone Encounter (Signed)
 Patient informed that Rx request has been sent to Emmy Blanch, NP.  Also explained that dose of MS Contin  cannot be changed outside of an appointment because it is an opioid.  He requests an sooner appt that his scheduled in early December.  Spoke with Nathanel, we don't have his schedule, asked patient to call back next week to schedule an appt.

## 2024-06-30 NOTE — Telephone Encounter (Signed)
 Patient did not receive his Celebrex  refill for Nov & Dec when he came for visit.  He is also asking Dr Myra to increase his 12 MS Contin  to 3 times a day instead of 2 times a day. If so he will need a new script for that as well

## 2024-07-06 DIAGNOSIS — M25562 Pain in left knee: Secondary | ICD-10-CM | POA: Diagnosis not present

## 2024-07-06 DIAGNOSIS — M1712 Unilateral primary osteoarthritis, left knee: Secondary | ICD-10-CM | POA: Diagnosis not present

## 2024-07-06 DIAGNOSIS — S42032A Displaced fracture of lateral end of left clavicle, initial encounter for closed fracture: Secondary | ICD-10-CM | POA: Diagnosis not present

## 2024-07-06 DIAGNOSIS — M25512 Pain in left shoulder: Secondary | ICD-10-CM | POA: Diagnosis not present

## 2024-07-07 ENCOUNTER — Ambulatory Visit: Attending: Anesthesiology | Admitting: Anesthesiology

## 2024-07-07 ENCOUNTER — Encounter: Payer: Self-pay | Admitting: Anesthesiology

## 2024-07-07 VITALS — BP 117/64 | HR 52 | Temp 98.0°F | Resp 20 | Ht 69.0 in | Wt 169.0 lb

## 2024-07-07 DIAGNOSIS — F1190 Opioid use, unspecified, uncomplicated: Secondary | ICD-10-CM | POA: Diagnosis not present

## 2024-07-07 DIAGNOSIS — M79642 Pain in left hand: Secondary | ICD-10-CM | POA: Diagnosis not present

## 2024-07-07 DIAGNOSIS — Z79891 Long term (current) use of opiate analgesic: Secondary | ICD-10-CM

## 2024-07-07 DIAGNOSIS — M25562 Pain in left knee: Secondary | ICD-10-CM | POA: Insufficient documentation

## 2024-07-07 DIAGNOSIS — M25561 Pain in right knee: Secondary | ICD-10-CM | POA: Insufficient documentation

## 2024-07-07 DIAGNOSIS — M545 Low back pain, unspecified: Secondary | ICD-10-CM | POA: Insufficient documentation

## 2024-07-07 DIAGNOSIS — M79641 Pain in right hand: Secondary | ICD-10-CM | POA: Diagnosis not present

## 2024-07-07 DIAGNOSIS — K5903 Drug induced constipation: Secondary | ICD-10-CM | POA: Insufficient documentation

## 2024-07-07 DIAGNOSIS — T402X5A Adverse effect of other opioids, initial encounter: Secondary | ICD-10-CM | POA: Insufficient documentation

## 2024-07-07 DIAGNOSIS — G8929 Other chronic pain: Secondary | ICD-10-CM | POA: Insufficient documentation

## 2024-07-07 DIAGNOSIS — G894 Chronic pain syndrome: Secondary | ICD-10-CM | POA: Diagnosis not present

## 2024-07-07 DIAGNOSIS — M5136 Other intervertebral disc degeneration, lumbar region with discogenic back pain only: Secondary | ICD-10-CM | POA: Diagnosis not present

## 2024-07-07 DIAGNOSIS — M170 Bilateral primary osteoarthritis of knee: Secondary | ICD-10-CM | POA: Diagnosis not present

## 2024-07-07 DIAGNOSIS — M47816 Spondylosis without myelopathy or radiculopathy, lumbar region: Secondary | ICD-10-CM | POA: Insufficient documentation

## 2024-07-07 MED ORDER — MORPHINE SULFATE ER 30 MG PO TBCR: 30.0000 mg | EXTENDED_RELEASE_TABLET | Freq: Three times a day (TID) | ORAL | 0 refills | Status: AC

## 2024-07-07 NOTE — Progress Notes (Signed)
 Subjective:  Patient ID: Tanner Baker, male    DOB: 07-12-1951  Age: 73 y.o. MRN: [id number]  CC: Back Pain   Procedure: None  HPI Tanner Baker presents for return evaluation.  Right he has continued to have complaints of low back pain similar in quality and characteristic to what he is experienced historically.  He generally gets more pain of his more severe nature and more recurrent nature during the winter months.  In the past he has taken his MS Contin  3 times a day but was able to wean back during the summer.  Secondary to this recurrent problem he like to increase to the 3 times a day dosing which is been well-established for him and worked well for him without side effect.  Otherwise the nature of his pain is about stable he does his daily stretching exercises but continues to have chronic aching gnawing pain only remedied by chronic opioid management.  He takes his MS Contin  30 mg tablets historically either twice a day or 3 times a day and this keeps the pain under good control.  He has had some opioid-based constipation in the past but this seems to be better right now.  He also mentions that he recently went in for a knee injection of steroid and has had some problems with his left shoulder for which he is taking tizanidine.  Outpatient Medications Prior to Visit  Medication Sig Dispense Refill   acetaminophen  (TYLENOL ) 325 MG tablet Take 2 tablets (650 mg total) by mouth every 6 (six) hours as needed for mild pain (pain score 1-3), moderate pain (pain score 4-6), fever or headache.     bisacodyl  (DULCOLAX) 5 MG EC tablet Take 2 tablets (10 mg total) by mouth at bedtime.     celecoxib  (CELEBREX ) 200 MG capsule Take 1 capsule (200 mg total) by mouth daily. 30 capsule 2   diclofenac Sodium (VOLTAREN) 1 % GEL Apply topically.     escitalopram (LEXAPRO) 10 MG tablet Take 10 mg by mouth daily.     famotidine  (PEPCID ) 20 MG tablet Take 20 mg by mouth 2 (two) times daily.     fluticasone   (FLONASE ) 50 MCG/ACT nasal spray Place 1 spray into both nostrils daily as needed. Reported on 03/21/2016     gabapentin  (NEURONTIN ) 100 MG capsule Take 1 capsule (100 mg total) by mouth 3 (three) times daily. 90 capsule 3   gabapentin  (NEURONTIN ) 100 MG capsule Take 1 capsule (100 mg total) by mouth 3 (three) times daily. 120 capsule 4   iron  polysaccharides (NIFEREX) 150 MG capsule Take 1 capsule (150 mg total) by mouth daily.     linaclotide  (LINZESS ) 72 MCG capsule Take 1 capsule (72 mcg total) by mouth daily before breakfast. 30 capsule 1   morphine  (MS CONTIN ) 30 MG 12 hr tablet Take 1 tablet (30 mg total) by mouth every 12 (twelve) hours. 60 tablet 0   Multiple Vitamins-Iron  (MULTIVITAMINS WITH IRON ) TABS tablet Take 1 tablet by mouth daily.     naloxone (NARCAN) nasal spray 4 mg/0.1 mL SMARTSIG:Both Nares     ondansetron  (ZOFRAN ) 4 MG tablet Take 1 tablet by mouth every 6 (six) hours as needed.     PARoxetine  (PAXIL ) 30 MG tablet Take 1 tablet by mouth daily.     polyethylene glycol (MIRALAX  / GLYCOLAX ) 17 g packet Take 17 g by mouth 2 (two) times daily.     propranolol  (INDERAL ) 20 MG tablet Take 20 mg by mouth 2 (two)  times daily.     SUMAtriptan  (IMITREX ) 100 MG tablet Take 100 mg by mouth once. May repeat in 2 hours if headache persists or recurs.     tamsulosin  (FLOMAX ) 0.4 MG CAPS capsule Take 1 capsule (0.4 mg total) by mouth daily. 30 capsule 11   tiZANidine (ZANAFLEX) 2 MG tablet Take 2 mg by mouth.     topiramate  (TOPAMAX ) 50 MG tablet Take 50 mg by mouth 2 (two) times daily.     traZODone  (DESYREL ) 50 MG tablet Take 100 mg by mouth at bedtime as needed for sleep. Take 1-2 tablets at bedtime as needed     [START ON 07/16/2024] morphine  (MS CONTIN ) 30 MG 12 hr tablet Take 1 tablet (30 mg total) by mouth every 12 (twelve) hours. 60 tablet 0   No facility-administered medications prior to visit.    Review of Systems CNS: No confusion or sedation Cardiac: No angina or  palpitations GI: No abdominal pain or constipation Constitutional: No nausea vomiting fevers or chills  Objective:  BP 117/64 (BP Location: Right Arm, Patient Position: Sitting, Cuff Size: Normal)   Pulse (!) 52   Temp 98 F (36.7 C) (Temporal)   Resp 20   Ht 5' 9 (1.753 m)   Wt 169 lb (76.7 kg)   SpO2 100%   BMI 24.96 kg/m    BP Readings from Last 3 Encounters:  07/07/24 117/64  06/02/24 (!) 149/72  02/22/24 117/88     Wt Readings from Last 3 Encounters:  07/07/24 169 lb (76.7 kg)  06/02/24 165 lb (74.8 kg)  02/22/24 165 lb (74.8 kg)     Physical Exam Pt is alert and oriented PERRL EOMI HEART IS RRR no murmur or rub LCTA no wheezing or rales MUSCULOSKELETAL reveals some paraspinous muscle tenderness in the lumbar region but no overt trigger points.  He walks with an antalgic gait and lower extremity muscle tone and bulk seems to be at baseline.  Labs  No results found for: HGBA1C Lab Results  Component Value Date   CREATININE 0.89 08/17/2023    -------------------------------------------------------------------------------------------------------------------- Lab Results  Component Value Date   WBC 10.9 (H) 08/17/2023   HGB 10.0 (L) 08/17/2023   HCT 31.4 (L) 08/17/2023   PLT 263 08/17/2023   GLUCOSE 112 (H) 08/17/2023   ALT 30 08/09/2023   AST 36 08/09/2023   NA 139 08/17/2023   K 4.2 08/17/2023   CL 104 08/17/2023   CREATININE 0.89 08/17/2023   BUN 23 08/17/2023   CO2 27 08/17/2023   INR 1.1 11/26/2020    --------------------------------------------------------------------------------------------------------------------- No results found.   Assessment & Plan:   Tanner Baker was seen today for back pain.  Diagnoses and all orders for this visit:  Chronic bilateral low back pain without sciatica  Degeneration of intervertebral disc of lumbar region with discogenic back pain  Primary osteoarthritis of both knees  Chronic pain  syndrome  Chronic, continuous use of opioids  Facet syndrome, lumbar  Pain in both hands  Opioid-induced constipation  Chronic pain of both knees  Other orders -     morphine  (MS CONTIN ) 30 MG 12 hr tablet; Take 1 tablet (30 mg total) by mouth in the morning, at noon, and at bedtime. -     morphine  (MS CONTIN ) 30 MG 12 hr tablet; Take 1 tablet (30 mg total) by mouth in the morning, at noon, and at bedtime.        ----------------------------------------------------------------------------------------------------------------------  Problem List Items Addressed This Visit  Unprioritized   Hand pain (Chronic)   Chronic pain syndrome   Relevant Medications   tiZANidine (ZANAFLEX) 2 MG tablet   morphine  (MS CONTIN ) 30 MG 12 hr tablet (Start on 07/12/2024)   morphine  (MS CONTIN ) 30 MG 12 hr tablet (Start on 08/11/2024)   Chronic, continuous use of opioids   DDD (degenerative disc disease), lumbar   DJD (degenerative joint disease) of knee   Relevant Medications   tiZANidine (ZANAFLEX) 2 MG tablet   morphine  (MS CONTIN ) 30 MG 12 hr tablet (Start on 07/12/2024)   morphine  (MS CONTIN ) 30 MG 12 hr tablet (Start on 08/11/2024)   Facet syndrome, lumbar   Relevant Medications   tiZANidine (ZANAFLEX) 2 MG tablet   morphine  (MS CONTIN ) 30 MG 12 hr tablet (Start on 07/12/2024)   morphine  (MS CONTIN ) 30 MG 12 hr tablet (Start on 08/11/2024)   Knee pain, bilateral   Low back pain - Primary   Relevant Medications   tiZANidine (ZANAFLEX) 2 MG tablet   morphine  (MS CONTIN ) 30 MG 12 hr tablet (Start on 07/12/2024)   morphine  (MS CONTIN ) 30 MG 12 hr tablet (Start on 08/11/2024)   Other Visit Diagnoses       Opioid-induced constipation             ----------------------------------------------------------------------------------------------------------------------  1. Chronic bilateral low back pain without sciatica (Primary) Continue current management with core  stretching strengthening exercises as reviewed.  2. Degeneration of intervertebral disc of lumbar region with discogenic back pain As above  3. Primary osteoarthritis of both knees Continue follow-up with orthopedics  4. Chronic pain syndrome I have reviewed the [place]  practitioner database information is appropriate for refill.  Will change him over to the MS Contin  tablets 30 mg strength for November 11 and December 11.  5. Chronic, continuous use of opioids As above  6. Facet syndrome, lumbar   7. Pain in both hands   8. Opioid-induced constipation This appears to be improved  9. Chronic pain of both knees     ----------------------------------------------------------------------------------------------------------------------  I have changed Tanner Baker's morphine . I am also having him start on morphine . Additionally, I am having him maintain his traZODone , SUMAtriptan , fluticasone , diclofenac Sodium, topiramate , famotidine , naloxone, ondansetron , PARoxetine , tamsulosin , linaclotide , gabapentin , multivitamins with iron , acetaminophen , iron  polysaccharides, polyethylene glycol, bisacodyl , gabapentin , escitalopram, propranolol , morphine , celecoxib , and tiZANidine.   Meds ordered this encounter  Medications   morphine  (MS CONTIN ) 30 MG 12 hr tablet    Sig: Take 1 tablet (30 mg total) by mouth in the morning, at noon, and at bedtime.    Dispense:  90 tablet    Refill:  0   morphine  (MS CONTIN ) 30 MG 12 hr tablet    Sig: Take 1 tablet (30 mg total) by mouth in the morning, at noon, and at bedtime.    Dispense:  90 tablet    Refill:  0   Patient's Medications  New Prescriptions   MORPHINE  (MS CONTIN ) 30 MG 12 HR TABLET    Take 1 tablet (30 mg total) by mouth in the morning, at noon, and at bedtime.  Previous Medications   ACETAMINOPHEN  (TYLENOL ) 325 MG TABLET    Take 2 tablets (650 mg total) by mouth every 6 (six) hours as needed for mild pain (pain score  1-3), moderate pain (pain score 4-6), fever or headache.   BISACODYL  (DULCOLAX) 5 MG EC TABLET    Take 2 tablets (10 mg total) by mouth at bedtime.   CELECOXIB  (CELEBREX ) 200  MG CAPSULE    Take 1 capsule (200 mg total) by mouth daily.   DICLOFENAC SODIUM (VOLTAREN) 1 % GEL    Apply topically.   ESCITALOPRAM (LEXAPRO) 10 MG TABLET    Take 10 mg by mouth daily.   FAMOTIDINE  (PEPCID ) 20 MG TABLET    Take 20 mg by mouth 2 (two) times daily.   FLUTICASONE  (FLONASE ) 50 MCG/ACT NASAL SPRAY    Place 1 spray into both nostrils daily as needed. Reported on 03/21/2016   GABAPENTIN  (NEURONTIN ) 100 MG CAPSULE    Take 1 capsule (100 mg total) by mouth 3 (three) times daily.   GABAPENTIN  (NEURONTIN ) 100 MG CAPSULE    Take 1 capsule (100 mg total) by mouth 3 (three) times daily.   IRON  POLYSACCHARIDES (NIFEREX) 150 MG CAPSULE    Take 1 capsule (150 mg total) by mouth daily.   LINACLOTIDE  (LINZESS ) 72 MCG CAPSULE    Take 1 capsule (72 mcg total) by mouth daily before breakfast.   MORPHINE  (MS CONTIN ) 30 MG 12 HR TABLET    Take 1 tablet (30 mg total) by mouth every 12 (twelve) hours.   MULTIPLE VITAMINS-IRON  (MULTIVITAMINS WITH IRON ) TABS TABLET    Take 1 tablet by mouth daily.   NALOXONE (NARCAN) NASAL SPRAY 4 MG/0.1 ML    SMARTSIG:Both Nares   ONDANSETRON  (ZOFRAN ) 4 MG TABLET    Take 1 tablet by mouth every 6 (six) hours as needed.   PAROXETINE  (PAXIL ) 30 MG TABLET    Take 1 tablet by mouth daily.   POLYETHYLENE GLYCOL (MIRALAX  / GLYCOLAX ) 17 G PACKET    Take 17 g by mouth 2 (two) times daily.   PROPRANOLOL  (INDERAL ) 20 MG TABLET    Take 20 mg by mouth 2 (two) times daily.   SUMATRIPTAN  (IMITREX ) 100 MG TABLET    Take 100 mg by mouth once. May repeat in 2 hours if headache persists or recurs.   TAMSULOSIN  (FLOMAX ) 0.4 MG CAPS CAPSULE    Take 1 capsule (0.4 mg total) by mouth daily.   TIZANIDINE (ZANAFLEX) 2 MG TABLET    Take 2 mg by mouth.   TOPIRAMATE  (TOPAMAX ) 50 MG TABLET    Take 50 mg by mouth 2 (two) times  daily.   TRAZODONE  (DESYREL ) 50 MG TABLET    Take 100 mg by mouth at bedtime as needed for sleep. Take 1-2 tablets at bedtime as needed  Modified Medications   Modified Medication Previous Medication   MORPHINE  (MS CONTIN ) 30 MG 12 HR TABLET morphine  (MS CONTIN ) 30 MG 12 hr tablet      Take 1 tablet (30 mg total) by mouth in the morning, at noon, and at bedtime.    Take 1 tablet (30 mg total) by mouth every 12 (twelve) hours.  Discontinued Medications   No medications on file   ----------------------------------------------------------------------------------------------------------------------  Follow-up: Return in about 2 months (around 09/06/2024) for evaluation, med refill.    Lynwood KANDICE Clause, MD

## 2024-07-07 NOTE — Progress Notes (Signed)
 Nursing Pain Medication Assessment:  Safety precautions to be maintained throughout the outpatient stay will include: orient to surroundings, keep bed in low position, maintain call bell within reach at all times, provide assistance with transfer out of bed and ambulation.  Medication Inspection Compliance: Pill count conducted under aseptic conditions, in front of the patient. Neither the pills nor the bottle was removed from the patient's sight at any time. Once count was completed pills were immediately returned to the patient in their original bottle.  Medication: Morphine  IR Pill/Patch Count: 17 of 60 pills/patches remain Pill/Patch Appearance: Markings consistent with prescribed medication Bottle Appearance: Standard pharmacy container. Clearly labeled. Filled Date: 45 / 12 / 2025 Last Medication intake:  Today

## 2024-07-07 NOTE — Progress Notes (Signed)
 Dionisio Aragones                                          MRN: [id number]   07/07/2024   The [facility] Quality Team Specialist reviewed this patient medical record for the purposes of chart review for care gap closure. The following were reviewed: abstraction for care gap closure-controlling blood pressure.    [facility] Quality Team

## 2024-07-13 DIAGNOSIS — C44612 Basal cell carcinoma of skin of right upper limb, including shoulder: Secondary | ICD-10-CM | POA: Diagnosis not present

## 2024-07-13 DIAGNOSIS — C44619 Basal cell carcinoma of skin of left upper limb, including shoulder: Secondary | ICD-10-CM | POA: Diagnosis not present

## 2024-09-12 ENCOUNTER — Encounter: Payer: Self-pay | Admitting: Anesthesiology

## 2024-09-12 ENCOUNTER — Ambulatory Visit: Attending: Anesthesiology | Admitting: Anesthesiology

## 2024-09-12 VITALS — BP 106/84 | HR 57 | Temp 97.5°F | Resp 18 | Ht 69.0 in | Wt 167.0 lb

## 2024-09-12 DIAGNOSIS — M79641 Pain in right hand: Secondary | ICD-10-CM | POA: Insufficient documentation

## 2024-09-12 DIAGNOSIS — F1190 Opioid use, unspecified, uncomplicated: Secondary | ICD-10-CM | POA: Insufficient documentation

## 2024-09-12 DIAGNOSIS — M170 Bilateral primary osteoarthritis of knee: Secondary | ICD-10-CM | POA: Diagnosis not present

## 2024-09-12 DIAGNOSIS — M79642 Pain in left hand: Secondary | ICD-10-CM | POA: Diagnosis not present

## 2024-09-12 DIAGNOSIS — M5136 Other intervertebral disc degeneration, lumbar region with discogenic back pain only: Secondary | ICD-10-CM | POA: Diagnosis not present

## 2024-09-12 DIAGNOSIS — K5903 Drug induced constipation: Secondary | ICD-10-CM | POA: Diagnosis not present

## 2024-09-12 DIAGNOSIS — M47816 Spondylosis without myelopathy or radiculopathy, lumbar region: Secondary | ICD-10-CM | POA: Insufficient documentation

## 2024-09-12 DIAGNOSIS — T402X5A Adverse effect of other opioids, initial encounter: Secondary | ICD-10-CM | POA: Diagnosis not present

## 2024-09-12 DIAGNOSIS — M545 Low back pain, unspecified: Secondary | ICD-10-CM | POA: Insufficient documentation

## 2024-09-12 DIAGNOSIS — G894 Chronic pain syndrome: Secondary | ICD-10-CM | POA: Diagnosis not present

## 2024-09-12 DIAGNOSIS — G8929 Other chronic pain: Secondary | ICD-10-CM | POA: Diagnosis present

## 2024-09-12 MED ORDER — MORPHINE SULFATE ER 30 MG PO TBCR: 30.0000 mg | EXTENDED_RELEASE_TABLET | Freq: Three times a day (TID) | ORAL | 0 refills | Status: AC

## 2024-09-12 MED ORDER — CELECOXIB 200 MG PO CAPS: 200.0000 mg | ORAL_CAPSULE | Freq: Every day | ORAL | 0 refills | Status: AC

## 2024-09-12 NOTE — Progress Notes (Signed)
 Nursing Pain Medication Assessment:  Safety precautions to be maintained throughout the outpatient stay will include: orient to surroundings, keep bed in low position, maintain call bell within reach at all times, provide assistance with transfer out of bed and ambulation.  Medication Inspection Compliance: Pill count conducted under aseptic conditions, in front of the patient. Neither the pills nor the bottle was removed from the patient's sight at any time. Once count was completed pills were immediately returned to the patient in their original bottle.  Medication: Morphine  IR Pill/Patch Count: 3 of 90 pills/patches remain Pill/Patch Appearance: Markings consistent with prescribed medication Bottle Appearance: Standard pharmacy container. Clearly labeled. Filled Date: 82 / 59 / 2025 Last Medication intake:  Today

## 2024-09-12 NOTE — Progress Notes (Signed)
 "  Subjective:  Patient ID: Tanner Baker, male    DOB: Feb 07, 1951  Age: 74 y.o. MRN: [id number]  CC: Back Pain   Procedure: None  HPI Tanner Baker presents for reevaluation.  He was last seen about 2 months ago and has been doing well.  The quality characteristic and distribution of the low back pain and neck pain he experiences is stable in nature.  No recent changes are noted.  He still taking his medication as prescribed using his MS Contin  30 mg tablets every 8 hours.  He generally gets 6 to 8 hours of relief with the medicine and has had effective response to therapy.  This generally keeps his pain under good control and enables him to stay active do his household chores and daily activities without side effect.  His constipation has been better as well.  No change in bowel or bladder function is noted at this time.  Outpatient Medications Prior to Visit  Medication Sig Dispense Refill   acetaminophen  (TYLENOL ) 325 MG tablet Take 2 tablets (650 mg total) by mouth every 6 (six) hours as needed for mild pain (pain score 1-3), moderate pain (pain score 4-6), fever or headache.     bisacodyl  (DULCOLAX) 5 MG EC tablet Take 2 tablets (10 mg total) by mouth at bedtime.     diclofenac Sodium (VOLTAREN) 1 % GEL Apply topically.     escitalopram (LEXAPRO) 10 MG tablet Take 10 mg by mouth daily.     famotidine  (PEPCID ) 20 MG tablet Take 20 mg by mouth 2 (two) times daily.     fluticasone  (FLONASE ) 50 MCG/ACT nasal spray Place 1 spray into both nostrils daily as needed. Reported on 03/21/2016     gabapentin  (NEURONTIN ) 100 MG capsule Take 1 capsule (100 mg total) by mouth 3 (three) times daily. 90 capsule 3   gabapentin  (NEURONTIN ) 100 MG capsule Take 1 capsule (100 mg total) by mouth 3 (three) times daily. 120 capsule 4   iron  polysaccharides (NIFEREX) 150 MG capsule Take 1 capsule (150 mg total) by mouth daily.     linaclotide  (LINZESS ) 72 MCG capsule Take 1 capsule (72 mcg total) by mouth daily  before breakfast. 30 capsule 1   Multiple Vitamins-Iron  (MULTIVITAMINS WITH IRON ) TABS tablet Take 1 tablet by mouth daily.     naloxone (NARCAN) nasal spray 4 mg/0.1 mL SMARTSIG:Both Nares     ondansetron  (ZOFRAN ) 4 MG tablet Take 1 tablet by mouth every 6 (six) hours as needed.     PARoxetine  (PAXIL ) 30 MG tablet Take 1 tablet by mouth daily.     polyethylene glycol (MIRALAX  / GLYCOLAX ) 17 g packet Take 17 g by mouth 2 (two) times daily.     propranolol  (INDERAL ) 20 MG tablet Take 20 mg by mouth 2 (two) times daily.     SUMAtriptan  (IMITREX ) 100 MG tablet Take 100 mg by mouth once. May repeat in 2 hours if headache persists or recurs.     tamsulosin  (FLOMAX ) 0.4 MG CAPS capsule Take 1 capsule (0.4 mg total) by mouth daily. 30 capsule 11   tiZANidine (ZANAFLEX) 2 MG tablet Take 2 mg by mouth.     topiramate  (TOPAMAX ) 50 MG tablet Take 50 mg by mouth 2 (two) times daily.     traZODone  (DESYREL ) 50 MG tablet Take 100 mg by mouth at bedtime as needed for sleep. Take 1-2 tablets at bedtime as needed     celecoxib  (CELEBREX ) 200 MG capsule Take 1 capsule (200 mg total)  by mouth daily. 30 capsule 2   No facility-administered medications prior to visit.    Review of Systems CNS: No confusion or sedation Cardiac: No angina or palpitations GI: No abdominal pain or constipation Constitutional: No nausea vomiting fevers or chills  Objective:  BP 106/84   Pulse (!) 57   Temp (!) 97.5 F (36.4 C)   Resp 18   Ht 5' 9 (1.753 m)   Wt 167 lb (75.8 kg)   SpO2 95%   BMI 24.66 kg/m    BP Readings from Last 3 Encounters:  09/12/24 106/84  07/07/24 117/64  06/02/24 (!) 149/72     Wt Readings from Last 3 Encounters:  09/12/24 167 lb (75.8 kg)  07/07/24 169 lb (76.7 kg)  06/02/24 165 lb (74.8 kg)     Physical Exam Pt is alert and oriented PERRL EOMI HEART IS RRR no murmur or rub LCTA no wheezing or rales MUSCULOSKELETAL reveals some paraspinous muscle tenderness but no overt trigger  points.  He is walking with an antalgic gait and has good balance.  Labs  No results found for: HGBA1C Lab Results  Component Value Date   CREATININE 0.89 08/17/2023    -------------------------------------------------------------------------------------------------------------------- Lab Results  Component Value Date   WBC 10.9 (H) 08/17/2023   HGB 10.0 (L) 08/17/2023   HCT 31.4 (L) 08/17/2023   PLT 263 08/17/2023   GLUCOSE 112 (H) 08/17/2023   ALT 30 08/09/2023   AST 36 08/09/2023   NA 139 08/17/2023   K 4.2 08/17/2023   CL 104 08/17/2023   CREATININE 0.89 08/17/2023   BUN 23 08/17/2023   CO2 27 08/17/2023   INR 1.1 11/26/2020    --------------------------------------------------------------------------------------------------------------------- No results found.   Assessment & Plan:   There are no diagnoses linked to this encounter.      ----------------------------------------------------------------------------------------------------------------------  Problem List Items Addressed This Visit   None     ----------------------------------------------------------------------------------------------------------------------  There are no diagnoses linked to this encounter.   ----------------------------------------------------------------------------------------------------------------------  I am having Tanner Baker start on morphine  and morphine . I am also having him maintain his traZODone , SUMAtriptan , fluticasone , diclofenac Sodium, topiramate , famotidine , naloxone, ondansetron , PARoxetine , tamsulosin , linaclotide , gabapentin , multivitamins with iron , acetaminophen , iron  polysaccharides, polyethylene glycol, bisacodyl , gabapentin , escitalopram, propranolol , tiZANidine, and celecoxib .   Meds ordered this encounter  Medications   morphine  (MS CONTIN ) 30 MG 12 hr tablet    Sig: Take 1 tablet (30 mg total) by mouth every 8 (eight) hours.    Dispense:   90 tablet    Refill:  0   morphine  (MS CONTIN ) 30 MG 12 hr tablet    Sig: Take 1 tablet (30 mg total) by mouth every 8 (eight) hours.    Dispense:  90 tablet    Refill:  0   celecoxib  (CELEBREX ) 200 MG capsule    Sig: Take 1 capsule (200 mg total) by mouth daily.    Dispense:  30 capsule    Refill:  0   Patient's Medications  New Prescriptions   MORPHINE  (MS CONTIN ) 30 MG 12 HR TABLET    Take 1 tablet (30 mg total) by mouth every 8 (eight) hours.   MORPHINE  (MS CONTIN ) 30 MG 12 HR TABLET    Take 1 tablet (30 mg total) by mouth every 8 (eight) hours.  Previous Medications   ACETAMINOPHEN  (TYLENOL ) 325 MG TABLET    Take 2 tablets (650 mg total) by mouth every 6 (six) hours as needed for mild pain (pain score 1-3), moderate pain (  pain score 4-6), fever or headache.   BISACODYL  (DULCOLAX) 5 MG EC TABLET    Take 2 tablets (10 mg total) by mouth at bedtime.   DICLOFENAC SODIUM (VOLTAREN) 1 % GEL    Apply topically.   ESCITALOPRAM (LEXAPRO) 10 MG TABLET    Take 10 mg by mouth daily.   FAMOTIDINE  (PEPCID ) 20 MG TABLET    Take 20 mg by mouth 2 (two) times daily.   FLUTICASONE  (FLONASE ) 50 MCG/ACT NASAL SPRAY    Place 1 spray into both nostrils daily as needed. Reported on 03/21/2016   GABAPENTIN  (NEURONTIN ) 100 MG CAPSULE    Take 1 capsule (100 mg total) by mouth 3 (three) times daily.   GABAPENTIN  (NEURONTIN ) 100 MG CAPSULE    Take 1 capsule (100 mg total) by mouth 3 (three) times daily.   IRON  POLYSACCHARIDES (NIFEREX) 150 MG CAPSULE    Take 1 capsule (150 mg total) by mouth daily.   LINACLOTIDE  (LINZESS ) 72 MCG CAPSULE    Take 1 capsule (72 mcg total) by mouth daily before breakfast.   MULTIPLE VITAMINS-IRON  (MULTIVITAMINS WITH IRON ) TABS TABLET    Take 1 tablet by mouth daily.   NALOXONE (NARCAN) NASAL SPRAY 4 MG/0.1 ML    SMARTSIG:Both Nares   ONDANSETRON  (ZOFRAN ) 4 MG TABLET    Take 1 tablet by mouth every 6 (six) hours as needed.   PAROXETINE  (PAXIL ) 30 MG TABLET    Take 1 tablet by mouth  daily.   POLYETHYLENE GLYCOL (MIRALAX  / GLYCOLAX ) 17 G PACKET    Take 17 g by mouth 2 (two) times daily.   PROPRANOLOL  (INDERAL ) 20 MG TABLET    Take 20 mg by mouth 2 (two) times daily.   SUMATRIPTAN  (IMITREX ) 100 MG TABLET    Take 100 mg by mouth once. May repeat in 2 hours if headache persists or recurs.   TAMSULOSIN  (FLOMAX ) 0.4 MG CAPS CAPSULE    Take 1 capsule (0.4 mg total) by mouth daily.   TIZANIDINE (ZANAFLEX) 2 MG TABLET    Take 2 mg by mouth.   TOPIRAMATE  (TOPAMAX ) 50 MG TABLET    Take 50 mg by mouth 2 (two) times daily.   TRAZODONE  (DESYREL ) 50 MG TABLET    Take 100 mg by mouth at bedtime as needed for sleep. Take 1-2 tablets at bedtime as needed  Modified Medications   Modified Medication Previous Medication   CELECOXIB  (CELEBREX ) 200 MG CAPSULE celecoxib  (CELEBREX ) 200 MG capsule      Take 1 capsule (200 mg total) by mouth daily.    Take 1 capsule (200 mg total) by mouth daily.  Discontinued Medications   No medications on file   ----------------------------------------------------------------------------------------------------------------------  Follow-up: Return in about 2 months (around 11/10/2024) for evaluation, med refill.  1. Chronic bilateral low back pain without sciatica   2. Degeneration of intervertebral disc of lumbar region with discogenic back pain   3. Primary osteoarthritis of both knees   4. Facet syndrome, lumbar   5. Chronic, continuous use of opioids   6. Chronic pain syndrome   7. Pain in both hands   8. Opioid-induced constipation    Based on our discussion today I think it is appropriate he refill his medicines for the next 2 months dated for January 12 and February 11.  Continue with his daily stretching strengthening exercises.  He has responded favorably to chronic opioid therapy.  I have reviewed the [place]  practitioner database information is appropriate for refill.  Will have  him continue with his current regimen and follow-up with  primary care for baseline medical care with return to clinic scheduled in 2 months.  Continue Celebrex  at 200 mg/day as needed.  Tanner KANDICE Clause, MD  "

## 2024-10-04 ENCOUNTER — Other Ambulatory Visit: Payer: Self-pay | Admitting: Internal Medicine

## 2024-10-04 DIAGNOSIS — R17 Unspecified jaundice: Secondary | ICD-10-CM

## 2024-10-05 ENCOUNTER — Ambulatory Visit
Admission: RE | Admit: 2024-10-05 | Discharge: 2024-10-05 | Disposition: A | Source: Ambulatory Visit | Attending: Internal Medicine | Admitting: Internal Medicine

## 2024-10-05 ENCOUNTER — Other Ambulatory Visit: Payer: Self-pay | Admitting: Internal Medicine

## 2024-10-05 ENCOUNTER — Ambulatory Visit: Admission: RE | Admit: 2024-10-05 | Discharge: 2024-10-05 | Attending: Internal Medicine | Admitting: Internal Medicine

## 2024-10-05 DIAGNOSIS — K831 Obstruction of bile duct: Secondary | ICD-10-CM

## 2024-10-05 DIAGNOSIS — R17 Unspecified jaundice: Secondary | ICD-10-CM

## 2024-10-05 MED ORDER — IOHEXOL 300 MG/ML  SOLN
100.0000 mL | Freq: Once | INTRAMUSCULAR | Status: AC | PRN
  Administered 2024-10-05: 100 mL via INTRAVENOUS

## 2024-10-05 MED ORDER — GADOBUTROL 1 MMOL/ML IV SOLN
7.5000 mL | Freq: Once | INTRAVENOUS | Status: AC | PRN
  Administered 2024-10-05: 7.5 mL via INTRAVENOUS

## 2024-10-06 ENCOUNTER — Telehealth: Payer: Self-pay | Admitting: Gastroenterology

## 2024-10-06 ENCOUNTER — Other Ambulatory Visit: Payer: Self-pay

## 2024-10-06 ENCOUNTER — Other Ambulatory Visit: Payer: Self-pay | Admitting: Internal Medicine

## 2024-10-06 DIAGNOSIS — K831 Obstruction of bile duct: Secondary | ICD-10-CM

## 2024-10-06 DIAGNOSIS — R17 Unspecified jaundice: Secondary | ICD-10-CM

## 2024-10-06 NOTE — Telephone Encounter (Signed)
 Good morning Dr. Wilhelmenia  We received a call from Dr. Lenon at [facility] and he wanted to speak with you regarding this patient needing an ERCP. He stated that the chart has information from a CT/MRCP recently done and he would like for you to review it and hopefully take on this care. Requesting a call back at [phone]  Thank you.

## 2024-10-06 NOTE — Telephone Encounter (Signed)
 Unfortuantely I have to decline., I am unable to accommodate this due to my February scheduling having been adjusted. Please let PCP office know. He is a [facility] patient with Dr. Onita previously. Putting Dr. Jinny on here as well, he may have more availability for this as an outpatient and may be able to prevent needing a [facility] visit either. Thanks. GM

## 2024-10-07 ENCOUNTER — Encounter: Payer: Self-pay | Admitting: Gastroenterology

## 2024-10-07 ENCOUNTER — Other Ambulatory Visit: Payer: Self-pay

## 2024-10-07 ENCOUNTER — Ambulatory Visit

## 2024-10-07 ENCOUNTER — Encounter: Admission: RE | Payer: Self-pay | Attending: Gastroenterology

## 2024-10-07 ENCOUNTER — Ambulatory Visit
Admission: RE | Admit: 2024-10-07 | Discharge: 2024-10-07 | Disposition: A | Discharge status: Home / Self Care | Source: Ambulatory Visit | Attending: Gastroenterology | Admitting: Gastroenterology

## 2024-10-07 DIAGNOSIS — K831 Obstruction of bile duct: Secondary | ICD-10-CM

## 2024-10-07 DIAGNOSIS — K8050 Calculus of bile duct without cholangitis or cholecystitis without obstruction: Secondary | ICD-10-CM

## 2024-10-07 DIAGNOSIS — R17 Unspecified jaundice: Secondary | ICD-10-CM

## 2024-10-07 MED ORDER — LACTATED RINGERS IV SOLN: INTRAVENOUS | Status: DC

## 2024-10-07 MED ORDER — DICLOFENAC SUPPOSITORY 100 MG
RECTAL | Status: AC
  Filled 2024-10-07: qty 1

## 2024-10-07 MED ORDER — LIDOCAINE HCL (CARDIAC) PF 100 MG/5ML IV SOSY
PREFILLED_SYRINGE | INTRAVENOUS | Status: DC | PRN
  Administered 2024-10-07: 100 mg via INTRAVENOUS

## 2024-10-07 MED ORDER — DICLOFENAC SUPPOSITORY 100 MG: 100.0000 mg | Freq: Once | RECTAL | Status: DC

## 2024-10-07 MED ORDER — PROPOFOL 500 MG/50ML IV EMUL
INTRAVENOUS | Status: DC | PRN
  Administered 2024-10-07: 150 ug/kg/min via INTRAVENOUS
  Administered 2024-10-07: 100 mg via INTRAVENOUS

## 2024-10-07 MED ORDER — DICLOFENAC SUPPOSITORY 100 MG
RECTAL | Status: DC | PRN
  Administered 2024-10-07: 100 mg via RECTAL

## 2024-10-07 MED ORDER — SODIUM CHLORIDE 0.9 % IV SOLN: INTRAVENOUS | Status: DC

## 2024-10-07 NOTE — Telephone Encounter (Signed)
 Spoke with PCP yesterday I told them to reach out to you. Hopefully they will and can accommodate or can ask their [facility] to help coordinating at [facility] ERCP. GM

## 2024-10-07 NOTE — Anesthesia Postprocedure Evaluation (Signed)
"   Anesthesia Post Note  Patient: Tanner Baker  Procedure(s) Performed: ERCP, WITH INTERVENTION IF INDICATED ERCP, WITH SPHINCTEROTOMY ERCP, WITH COMMON BILE DUCT CALCULUS REMOVAL USING BALLOON  Patient location during evaluation: PACU Anesthesia Type: General Level of consciousness: awake and alert Pain management: satisfactory to patient Vital Signs Assessment: post-procedure vital signs reviewed and stable Respiratory status: spontaneous breathing Cardiovascular status: blood pressure returned to baseline Anesthetic complications: no   There were no known notable events for this encounter.   Last Vitals:  Vitals:   10/07/24 1210 10/07/24 1220  BP: 125/64 117/67  Pulse: (!) 52   Resp: 14 13  Temp: (!) 36.2 C   SpO2: 100%     Last Pain:  Vitals:   10/07/24 1220  TempSrc:   PainSc: 0-No pain                 VAN STAVEREN,Maximino Cozzolino      "

## 2024-10-07 NOTE — Anesthesia Preprocedure Evaluation (Signed)
 "                                  Anesthesia Evaluation  Patient identified by MRN, date of birth, ID band Patient awake    Reviewed: Allergy & Precautions, NPO status , Patient's Chart, lab work & pertinent test results  Airway Mallampati: II  TM Distance: >3 FB Neck ROM: full    Dental  (+) Upper Dentures   Pulmonary neg pulmonary ROS, sleep apnea , Patient abstained from smoking., former smoker   Pulmonary exam normal breath sounds clear to auscultation       Cardiovascular Exercise Tolerance: Good hypertension, Pt. on medications negative cardio ROS Normal cardiovascular exam Rhythm:Regular Rate:Normal     Neuro/Psych  Headaches  Anxiety     negative neurological ROS  negative psych ROS   GI/Hepatic negative GI ROS, Neg liver ROS,,,  Endo/Other  negative endocrine ROS    Renal/GU negative Renal ROS  negative genitourinary   Musculoskeletal   Abdominal   Peds negative pediatric ROS (+)  Hematology negative hematology ROS (+)   Anesthesia Other Findings Past Medical History: No date: Anxiety No date: Cancer [facility])     Comment:  skin cancer 05/09/2021: Chronic low back pain No date: Chronic pain syndrome 05/09/2021: Chronic, continuous use of opioids No date: DDD (degenerative disc disease), cervical No date: Depression No date: Depression No date: Glaucoma No date: Heartburn No date: History of kidney stones No date: Hypertension 05/09/2021: Knee pain, bilateral No date: Migraine No date: Migraine No date: Post-traumatic headache No date: Sleep apnea No date: Traumatic arthritis  Past Surgical History: 09/01/1993: arm surg; Right 02/22/2024: COLONOSCOPY; N/A     Comment:  Procedure: COLONOSCOPY;  Surgeon: Onita Elspeth Sharper,              DO;  Location: [facility] ENDOSCOPY;  Service:               Gastroenterology;  Laterality: N/A; 09/01/1992: COSMETIC SURGERY     Comment:  all of teeth No date: FRACTURE SURGERY 11/27/2020:  INTRAMEDULLARY (IM) NAIL INTERTROCHANTERIC; Right     Comment:  Procedure: INTRAMEDULLARY (IM) NAIL INTERTROCHANTRIC;                Surgeon: Tobie Priest, MD;  Location: [facility] ORS;  Service:              Orthopedics;  Laterality: Right; 02/22/2024: POLYPECTOMY     Comment:  Procedure: POLYPECTOMY, INTESTINE;  Surgeon: Onita Elspeth Sharper, DO;  Location: [facility] ENDOSCOPY;  Service:               Gastroenterology;; No date: SKIN CANCER EXCISION No date: TONSILLECTOMY No date: WISDOM TOOTH EXTRACTION  BMI    Body Mass Index: 23.63 kg/m      Reproductive/Obstetrics negative OB ROS                              Anesthesia Physical Anesthesia Plan  ASA: 2  Anesthesia Plan: General   Post-op Pain Management:    Induction: Intravenous  PONV Risk Score and Plan: Propofol  infusion and TIVA  Airway Management Planned: Natural Airway and Nasal Cannula  Additional Equipment:   Intra-op Plan:   Post-operative Plan:   Informed Consent: I have reviewed the patients History and Physical,  chart, labs and discussed the procedure including the risks, benefits and alternatives for the proposed anesthesia with the patient or authorized representative who has indicated his/her understanding and acceptance.     Dental Advisory Given  Plan Discussed with: CRNA  Anesthesia Plan Comments:         Anesthesia Quick Evaluation  "

## 2024-10-07 NOTE — Op Note (Signed)
 [facility]astroenterology Patient Name: Tanner Baker Procedure Date: 10/07/2024 11:02 AM MRN: [id number] Account #: [id number] Date of Birth: [birth year] Admit Type: Outpatient Age: 74 Room: [facility] ENDO ROOM 4 Gender: Male Note Status: Finalized Instrument Name: CELINDA [id number] Procedure:             ERCP Indications:           Common bile duct stone(s) Providers:             Rogelia Copping MD, MD Referring MD:          L[facility]man ORN. Lenon MD, MD (Referring MD) Medicines:             Propofol  per Anesthesia Complications:         No immediate complications. Procedure:             Pre-Anesthesia Assessment:                        - Prior to the procedure, a History and Physical was                         performed, and patient medications and allergies were                         reviewed. The patient's tolerance of previous                         anesthesia was also reviewed. The risks and benefits                         of the procedure and the sedation options and risks                         were discussed with the patient. All questions were                         answered, and informed consent was obtained. Prior                         Anticoagulants: The patient has taken no anticoagulant                         or antiplatelet agents. ASA Grade Assessment: II - A                         patient with mild systemic disease. After reviewing                         the risks and benefits, the patient was deemed in                         satisfactory condition to undergo the procedure.                        After obtaining informed consent, the scope was passed                         under direct vision. Throughout the procedure, the  patient's blood pressure, pulse, and oxygen                         saturations were monitored continuously. The                         Duodenoscope was introduced through the mouth, and                          used to inject contrast into and used to inject                         contrast into the bile duct. The ERCP was accomplished                         without difficulty. The patient tolerated the                         procedure well. Findings:      The scout film was normal. The esophagus was successfully intubated       under direct vision. The scope was advanced to a normal major papilla in       the descending duodenum without detailed examination of the pharynx,       larynx and associated structures, and upper GI tract. The upper GI tract       was grossly normal. The bile duct was deeply cannulated with the       short-nosed traction sphincterotome. Contrast was injected. I personally       interpreted the bile duct images. There was brisk flow of contrast       through the ducts. Image quality was excellent. Contrast extended to the       entire biliary tree. The lower third of the main bile duct contained one       stone, which was large in diameter. , The major papilla was on the rim       of a diverticulum. A wire was passed into the biliary tree. An 8 mm       biliary sphincterotomy was made with a traction (standard)       sphincterotome using ERBE electrocautery. There was no       post-sphincterotomy bleeding. The biliary tree was swept with a 15 mm       balloon starting at the bifurcation. One stone was removed. No stones       remained. 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                        - Choledocholithiasis was found. Complete removal was                         accomplished by biliary sphincterotomy and balloon                         extraction.                        - A biliary sphincterotomy was performed.                        -  The biliary tree was swept. Recommendation:        - Discharge patient to home.                        - Clear liquid diet today.                        - Watch for  pancreatitis, bleeding, perforation, and                         cholangitis.                        - Refer to a careers adviser. Procedure Code(s):     --- Professional ---                        [phone], Endoscopic retrograde cholangiopancreatography                         (ERCP); with removal of calculi/debris from                         biliary/pancreatic duct(s)                        [id number], Endoscopic retrograde cholangiopancreatography                         (ERCP); with sphincterotomy/papillotomy                        [phone], Endoscopic catheterization of the biliary                         ductal system, radiological supervision and                         interpretation Diagnosis Code(s):     --- Professional ---                        K80.50, Calculus of bile duct without cholangitis or                         cholecystitis without obstruction CPT copyright 2022 [employer] Association. All rights reserved. The codes documented in this report are preliminary and upon coder review may  be revised to meet current compliance requirements. Rogelia Copping MD, MD 10/07/2024 11:39:45 AM This report has been signed electronically. Number of Addenda: 0 Note Initiated On: 10/07/2024 11:02 AM Estimated Blood Loss:  Estimated blood loss: none.      [facility]

## 2024-10-07 NOTE — H&P (Signed)
 "  Tanner Copping, MD [facility] [address] [place], [place] [address] [phone] Fax : [phone]  Primary Care Physician:  Lenon Layman ORN, MD Primary Gastroenterologist:  Dr. Copping  Pre-Procedure History & Physical: HPI:  Tanner Baker is a 74 y.o. male is here for an ERCP.   Past Medical History:  Diagnosis Date   Anxiety    Cancer (HCC)    skin cancer   Chronic low back pain 05/09/2021   Chronic pain syndrome    Chronic, continuous use of opioids 05/09/2021   DDD (degenerative disc disease), cervical    Depression    Depression    Glaucoma    Heartburn    History of kidney stones    Hypertension    Knee pain, bilateral 05/09/2021   Migraine    Migraine    Post-traumatic headache    Sleep apnea    Traumatic arthritis     Past Surgical History:  Procedure Laterality Date   arm surg Right 09/01/1993   COLONOSCOPY N/A 02/22/2024   Procedure: COLONOSCOPY;  Surgeon: Onita Elspeth Sharper, DO;  Location: [facility] ENDOSCOPY;  Service: Gastroenterology;  Laterality: N/A;   COSMETIC SURGERY  09/01/1992   all of teeth   FRACTURE SURGERY     INTRAMEDULLARY (IM) NAIL INTERTROCHANTERIC Right 11/27/2020   Procedure: INTRAMEDULLARY (IM) NAIL INTERTROCHANTRIC;  Surgeon: Tobie Priest, MD;  Location: [facility] ORS;  Service: Orthopedics;  Laterality: Right;   POLYPECTOMY  02/22/2024   Procedure: POLYPECTOMY, INTESTINE;  Surgeon: Onita Elspeth Sharper, DO;  Location: [facility] ENDOSCOPY;  Service: Gastroenterology;;   SKIN CANCER EXCISION     TONSILLECTOMY     WISDOM TOOTH EXTRACTION      Prior to Admission medications  Medication Sig Start Date End Date Taking? Authorizing Provider  escitalopram (LEXAPRO) 10 MG tablet Take 10 mg by mouth daily. 05/11/24 05/11/25 Yes [provider]  famotidine  (PEPCID ) 20 MG tablet Take 20 mg by mouth 2 (two) times daily. 04/30/21  Yes [provider]  morphine  (MS CONTIN ) 30 MG 12 hr tablet Take 1 tablet (30 mg total) by mouth  every 8 (eight) hours. 09/12/24 10/12/24 Yes Myra Lynwood MATSU, MD  morphine  (MS CONTIN ) 30 MG 12 hr tablet Take 1 tablet (30 mg total) by mouth every 8 (eight) hours. 10/12/24 11/11/24 Yes Myra Lynwood MATSU, MD  Multiple Vitamins-Iron  (MULTIVITAMINS WITH IRON ) TABS tablet Take 1 tablet by mouth daily.   Yes [provider]  PARoxetine  (PAXIL ) 30 MG tablet Take 1 tablet by mouth daily. 04/30/21  Yes [provider]  propranolol  (INDERAL ) 20 MG tablet Take 20 mg by mouth 2 (two) times daily. 05/18/24  Yes [provider]  tamsulosin  (FLOMAX ) 0.4 MG CAPS capsule Take 1 capsule (0.4 mg total) by mouth daily. 05/08/22  Yes Francisca Redell BROCKS, MD  topiramate  (TOPAMAX ) 50 MG tablet Take 50 mg by mouth 2 (two) times daily. 09/28/20  Yes [provider]  acetaminophen  (TYLENOL ) 325 MG tablet Take 2 tablets (650 mg total) by mouth every 6 (six) hours as needed for mild pain (pain score 1-3), moderate pain (pain score 4-6), fever or headache. 08/17/23   Von Bellis, MD  bisacodyl  (DULCOLAX) 5 MG EC tablet Take 2 tablets (10 mg total) by mouth at bedtime. 08/17/23   Von Bellis, MD  celecoxib  (CELEBREX ) 200 MG capsule Take 1 capsule (200 mg total) by mouth daily. 09/12/24 10/12/24  Myra Lynwood MATSU, MD  diclofenac  Sodium (VOLTAREN) 1 % GEL Apply topically. 05/02/20   [provider]  fluticasone  (FLONASE ) 50 MCG/ACT nasal spray Place 1 spray into both nostrils daily as needed. Reported on 03/21/2016 06/27/13   [provider]  gabapentin  (NEURONTIN ) 100 MG capsule Take 1 capsule (100 mg total) by mouth 3 (three) times daily. 05/13/23 09/12/24  Myra Lynwood MATSU, MD  gabapentin  (NEURONTIN ) 100 MG capsule Take 1 capsule (100 mg total) by mouth 3 (three) times daily. 11/16/23 09/12/24  Myra Lynwood MATSU, MD  iron  polysaccharides (NIFEREX) 150 MG capsule Take 1 capsule (150 mg total) by mouth daily. 08/18/23 09/12/24  Von Bellis, MD  linaclotide  (LINZESS ) 72 MCG capsule Take 1 capsule (72  mcg total) by mouth daily before breakfast. 06/18/22 09/12/24  Adams, James G, MD  naloxone [facility]) nasal spray 4 mg/0.1 mL SMARTSIG:Both Nares 11/14/20   [provider]  ondansetron  (ZOFRAN ) 4 MG tablet Take 1 tablet by mouth every 6 (six) hours as needed. 11/28/20   [provider]  polyethylene glycol (MIRALAX  / GLYCOLAX ) 17 g packet Take 17 g by mouth 2 (two) times daily. 08/17/23   Von Bellis, MD  SUMAtriptan  (IMITREX ) 100 MG tablet Take 100 mg by mouth once. May repeat in 2 hours if headache persists or recurs.    [provider]  tiZANidine (ZANAFLEX) 2 MG tablet Take 2 mg by mouth. 07/06/24 07/06/25  [provider]  traZODone  (DESYREL ) 50 MG tablet Take 100 mg by mouth at bedtime as needed for sleep. Take 1-2 tablets at bedtime as needed    [provider]    Allergies as of 10/07/2024 - Review Complete 10/07/2024  Allergen Reaction Noted   Doxycycline Rash 02/04/2016    Family History  Problem Relation Age of Onset   Cancer Mother        melanoma   Lung cancer Mother    Diabetes Father    Heart disease Father    Cancer Father        melanoma   Kidney Stones Father    Prostate cancer Paternal Uncle    Kidney disease Neg Hx     Social History   Socioeconomic History   Marital status: Married    Spouse name: Not on file   Number of children: Not on file   Years of education: Not on file   Highest education level: Not on file  Occupational History   Not on file  Tobacco Use   Smoking status: Former    Passive exposure: Past   Smokeless tobacco: Never   Tobacco comments:    smoked for only a few weeks when young  Vaping Use   Vaping status: Never Used  Substance and Sexual Activity   Alcohol use: No    Alcohol/week: 0.0 standard drinks of alcohol   Drug use: No   Sexual activity: Not on file  Other Topics Concern   Not on file  Social History Narrative   Not on file   Social Drivers of Health   Tobacco Use:  Medium Risk (10/07/2024)   Patient History    Smoking Tobacco Use: Former    Smokeless Tobacco Use: Never    Passive Exposure: Past  Physicist, Medical Strain: Low Risk  (08/30/2024)   Received from [facility]   Overall Financial Resource Strain (CARDIA)    Difficulty of Paying Living Expenses: Not hard at all  Food Insecurity: No Food Insecurity (08/30/2024)   Received from [facility]   Epic    Within the past 12 months, you  worried that your food would run out before you got the money to buy more.: Never true    Within the past 12 months, the food you bought just didn't last and you didn't have money to get more.: Never true  Transportation Needs: No Transportation Needs (08/30/2024)   Received from [facility] - Transportation    In the past 12 months, has lack of transportation kept you from medical appointments or from getting medications?: No    Lack of Transportation (Non-Medical): No  Physical Activity: Not on file  Stress: Not on file  Social Connections: Not on file  Intimate Partner Violence: Not At Risk (08/10/2023)   Humiliation, Afraid, Rape, and Kick questionnaire    Fear of Current or Ex-Partner: No    Emotionally Abused: No    Physically Abused: No    Sexually Abused: No  Depression (PHQ2-9): Low Risk (09/12/2024)   Depression (PHQ2-9)    PHQ-2 Score: 0  Alcohol Screen: Not on file  Housing: Low Risk  (10/06/2024)   Received from [facility]   Epic    In the last 12 months, was there a time when you were not able to pay the mortgage or rent on time?: No    In the past 12 months, how many times have you moved where you were living?: 0    At any time in the past 12 months, were you homeless or living in a shelter (including now)?: No  Utilities: Not At Risk (08/30/2024)   Received from [facility]   Epic    In the past 12 months has the electric, gas, oil, or water  company threatened to shut off services in your home?: No  Health Literacy: Not on file    Review of Systems: See HPI, otherwise negative ROS  Physical Exam: BP 127/71   Pulse (!) 59   Temp (!) 97 F (36.1 C) (Temporal)   Resp 16   Ht 5' 9 (1.753 m)   Wt 72.6 kg   SpO2 100%   BMI 23.63 kg/m  General:   Alert,  pleasant and cooperative in NAD Head:  Normocephalic and atraumatic. Neck:  Supple; no masses or thyromegaly. Lungs:  Clear throughout to auscultation.    Heart:  Regular rate and rhythm. Abdomen:  Soft, nontender and nondistended. Normal bowel sounds, without guarding, and without rebound.   Neurologic:  Alert and  oriented x4;  grossly normal neurologically.  Impression/Plan: Tanner Baker is here for an ERCP to be performed for CBD stone  Risks, benefits, limitations, and alternatives regarding  ERCP have been reviewed with the patient.  Questions have been answered.  All parties agreeable.   Tanner Copping, MD  10/07/2024, 11:40 AM "

## 2024-10-07 NOTE — Transfer of Care (Signed)
 Immediate Anesthesia Transfer of Care Note  Patient: Tanner Baker  Procedure(s) Performed: ERCP, WITH INTERVENTION IF INDICATED ERCP, WITH SPHINCTEROTOMY ERCP, WITH COMMON BILE DUCT CALCULUS REMOVAL USING BALLOON  Patient Location: Endoscopy Unit  Anesthesia Type:General  Level of Consciousness: awake and drowsy  Airway & Oxygen Therapy: Patient Spontanous Breathing  Post-op Assessment: Report given to RN and Post -op Vital signs reviewed and stable  Post vital signs: Reviewed and stable  Last Vitals:  Vitals Value Taken Time  BP 110/67 10/07/24 11:47  Temp    Pulse 60 10/07/24 11:48  Resp 19 10/07/24 11:48  SpO2 98 % 10/07/24 11:48  Vitals shown include unfiled device data.  Last Pain:  Vitals:   10/07/24 1050  TempSrc: Temporal  PainSc: 0-No pain         Complications: There were no known notable events for this encounter.
